# Patient Record
Sex: Female | Born: 1945
Health system: Southern US, Community
[De-identification: ages and names within clinical notes are randomized; demographics above are authoritative.]

## PROBLEM LIST (undated history)

## (undated) DIAGNOSIS — J449 Chronic obstructive pulmonary disease, unspecified: Secondary | ICD-10-CM

## (undated) DIAGNOSIS — H548 Legal blindness, as defined in USA: Secondary | ICD-10-CM

## (undated) DIAGNOSIS — E119 Type 2 diabetes mellitus without complications: Secondary | ICD-10-CM

## (undated) DIAGNOSIS — I517 Cardiomegaly: Secondary | ICD-10-CM

## (undated) DIAGNOSIS — I10 Essential (primary) hypertension: Secondary | ICD-10-CM

## (undated) DIAGNOSIS — Z87442 Personal history of urinary calculi: Secondary | ICD-10-CM

## (undated) DIAGNOSIS — I639 Cerebral infarction, unspecified: Secondary | ICD-10-CM

## (undated) DIAGNOSIS — K219 Gastro-esophageal reflux disease without esophagitis: Secondary | ICD-10-CM

## (undated) DIAGNOSIS — D649 Anemia, unspecified: Secondary | ICD-10-CM

## (undated) DIAGNOSIS — E7800 Pure hypercholesterolemia, unspecified: Secondary | ICD-10-CM

## (undated) DIAGNOSIS — I509 Heart failure, unspecified: Secondary | ICD-10-CM

## (undated) DIAGNOSIS — I4891 Unspecified atrial fibrillation: Secondary | ICD-10-CM

## (undated) DIAGNOSIS — G4700 Insomnia, unspecified: Secondary | ICD-10-CM

## (undated) DIAGNOSIS — I82409 Acute embolism and thrombosis of unspecified deep veins of unspecified lower extremity: Secondary | ICD-10-CM

## (undated) DIAGNOSIS — I96 Gangrene, not elsewhere classified: Secondary | ICD-10-CM

## (undated) HISTORY — PX: GLAUCOMA SURGERY: SHX656

## (undated) HISTORY — PX: CATARACT EXTRACTION, BILATERAL: SHX1313

## (undated) HISTORY — DX: Chronic obstructive pulmonary disease, unspecified: J44.9

## (undated) HISTORY — DX: Cerebral infarction, unspecified: I63.9

## (undated) HISTORY — DX: Anemia, unspecified: D64.9

## (undated) HISTORY — DX: Gastro-esophageal reflux disease without esophagitis: K21.9

## (undated) HISTORY — DX: Pure hypercholesterolemia, unspecified: E78.00

## (undated) HISTORY — DX: Acute embolism and thrombosis of unspecified deep veins of unspecified lower extremity: I82.409

## (undated) HISTORY — DX: Heart failure, unspecified: I50.9

## (undated) HISTORY — DX: Legal blindness, as defined in USA: H54.8

## (undated) HISTORY — DX: Type 2 diabetes mellitus without complications: E11.9

## (undated) HISTORY — PX: TUBAL LIGATION: SHX77

## (undated) HISTORY — DX: Insomnia, unspecified: G47.00

## (undated) HISTORY — PX: OTHER SURGICAL HISTORY: SHX169

## (undated) HISTORY — DX: Essential (primary) hypertension: I10

## (undated) HISTORY — DX: Cardiomegaly: I51.7

## (undated) HISTORY — PX: EYE SURGERY: SHX253

## (undated) HISTORY — DX: Gangrene, not elsewhere classified: I96

## (undated) HISTORY — DX: Unspecified atrial fibrillation: I48.91

---

## 2001-11-12 ENCOUNTER — Inpatient Hospital Stay (HOSPITAL_COMMUNITY): Admission: EM | Admit: 2001-11-12 | Discharge: 2001-11-18 | Payer: Self-pay | Admitting: Internal Medicine

## 2003-02-27 HISTORY — PX: FRACTURE SURGERY: SHX138

## 2008-02-27 HISTORY — PX: TIBIA FRACTURE SURGERY: SHX806

## 2009-12-14 ENCOUNTER — Emergency Department (HOSPITAL_COMMUNITY): Admission: EM | Admit: 2009-12-14 | Discharge: 2009-12-14 | Payer: Self-pay | Admitting: Emergency Medicine

## 2010-05-10 LAB — GLUCOSE, CAPILLARY: Glucose-Capillary: 118 mg/dL — ABNORMAL HIGH (ref 70–99)

## 2010-05-15 ENCOUNTER — Emergency Department (HOSPITAL_COMMUNITY)
Admission: EM | Admit: 2010-05-15 | Discharge: 2010-05-15 | Disposition: A | Discharge status: Home / Self Care | Payer: Self-pay | Attending: Emergency Medicine | Admitting: Emergency Medicine

## 2010-05-15 ENCOUNTER — Emergency Department (HOSPITAL_COMMUNITY): Payer: Self-pay

## 2010-05-15 DIAGNOSIS — H5440 Blindness, one eye, unspecified eye: Secondary | ICD-10-CM | POA: Insufficient documentation

## 2010-05-15 DIAGNOSIS — M5030 Other cervical disc degeneration, unspecified cervical region: Secondary | ICD-10-CM | POA: Insufficient documentation

## 2010-05-15 DIAGNOSIS — I10 Essential (primary) hypertension: Secondary | ICD-10-CM | POA: Insufficient documentation

## 2010-05-15 DIAGNOSIS — M542 Cervicalgia: Secondary | ICD-10-CM | POA: Insufficient documentation

## 2010-05-15 DIAGNOSIS — Z7901 Long term (current) use of anticoagulants: Secondary | ICD-10-CM | POA: Insufficient documentation

## 2010-05-15 DIAGNOSIS — I4891 Unspecified atrial fibrillation: Secondary | ICD-10-CM | POA: Insufficient documentation

## 2010-05-15 DIAGNOSIS — E119 Type 2 diabetes mellitus without complications: Secondary | ICD-10-CM | POA: Insufficient documentation

## 2010-07-14 NOTE — Discharge Summary (Signed)
NAME:  Rasmusson, ELLEAH DELLING                           ACCOUNT NO.:  [id number]   MEDICAL RECORD NO.:  [id number]                   PATIENT TYPE:  INP   LOCATION:  [id number]                                 FACILITY:  [facility]   PHYSICIAN:  Delrae Rend, M.D.                  DATE OF BIRTH:  [birth year]   DATE OF ADMISSION:  11/12/2001  DATE OF DISCHARGE:  11/18/2001                                 DISCHARGE SUMMARY   DISCHARGE DIAGNOSES:  1. Left lower lobe pneumonia.  2. Chronic obstructive pulmonary disease.  3. Diabetes mellitus.  4. Chronic atrial fibrillation.  5. Glaucoma.   DISCHARGE MEDICATIONS:  1. Coumadin 5 mg Monday, Wednesday, Friday, and Saturday and 10 mg Tuesday,     Thursday, and Sunday.  2. Cardizem 240 mg p.o. q.d.  3. Diovan/HCT 160/12.5 one q.d.  4. Combivent MDI 1-2 puffs q.i.d.  5. Glucophage 500 mg 1 tablet p.o. b.i.d.  6. Augmentin 875 mg 1 tablet b.i.d. for seven days.  7. Timolol 0.5% one drop each eye q.d.  8. Azopt 1% one drop each eye b.i.d.  9. Travatan 0.004% one drop each eye q.h.s.  10.      Prednisone taper 50 mg q.d. x2 days, 40 mg q.d. x2 days, 30 mg q.d.     x2 days, 20 mg q.d. x2 days, then 10 mg q.d. x2 days.   DISPOSITION AND FOLLOW UP:  The patient was discharged feeling much better  and in good condition.  She is to follow up with Dr. Manuella Ghazi at [facility]  on November 21, 2001 at 11 a.m.  We asked Dr. Manuella Ghazi to please evaluate her  INR, as she was not fully therapeutic when she left the hospital.  She left  the hospital with an INR of 1.6.  Also, we asked him to please check her  glucose blood sugars, as she was requiring insulin during her hospital stay.   PROCEDURES:  None.   CONSULTATIONS:  None.   HISTORY OF PRESENT ILLNESS:  The patient is a 65 year old African American  female who began to have chills eight days prior to admission.  The  following day, after the onset of chills, she developed shortness of breath  and wheezing and  orthopnea.  She received antibiotics from her physician  without any improvement.  Her shortness of breath increased and she went to  the [facility] on Sunday, which was three days prior to her admission.  At the time, she endorsed a slight productive cough and fever, positive  orthopnea.  No nausea, vomiting, or diarrhea.  No chest pain, no numbness,  no dizziness.  She was admitted to [facility], where a chest x-ray and CT  were performed showing bilateral left lower lobe infiltrate and possibly a  left upper lung infiltrate with no signs of pulmonary embolism.  She  received low-dose steroids 30 mg  and Levaquin.  Also, Rocephin at [facility] with little improvement of her shortness of breath.  She was still  febrile at that hospital.  The patient was transferred to [facility]  secondary to family wishes and we received her on November 12, 2001.  At  the time of admission, she came with a sputum culture from the outside  hospital showing moderate gram-positive cocci, moderate white blood cells,  few gram-positive diplococci, and rare gram-negative.  She had had a blood  culture that was negative x48 hours.  She had an INR of 2.3, a PT of 19.1, a  white cell count of 6.0, an H&H of 12.9 and 37.6, platelets of 181, and a  BNP of 46.7.  She had a basic metabolic panel showing sodium 137, potassium  3.7, chloride 102, CO2 27.0, BUN 7, creatinine 0.7, and glucose was 187.  Calcium 8.6.  IgG at 873.  Total protein was 7.7, albumin 3.3, and alkaline  phosphatase 48.  On physical exam, she was found to have bilateral basal  rhonchi and rales, right greater than left, with a few scattered expiratory  wheezes.  She had positive egophony in the right lung diffusely and she had  an irregular heart rate but no murmurs, rubs, or gallops were appreciated.  She had some edema in her right lower leg at a site of a prior surgery and  no edema on her left lower leg.  Everything else in her  physical exam was  benign.   HOSPITAL COURSE:  1. PNEUMONIA:  The patient was admitted with known bilateral lower lobe     pneumonia.  She had received Levaquin and Rocephin x4 days at the outside     hospital.  On arrival at [facility], she was started on azithromycin and     Zosyn.  She remained afebrile throughout her hospital stay and did     require a significant amount, 3-4 L, of oxygen in order to keep her     saturations up above 90%.  She had a chest x-ray performed here showing     left lower lobe infiltrates bilaterally and she was found to have a white     cell count of 13.9.  Her blood cultures were negative, sputum cultures     were negative, and she remained afebrile throughout the hospital course.     On day #4 of hospital admission, the patient was switched from IV     azithromycin and Zosyn to oral Augmentin.  She continued to be afebrile     and continued to improve.  At the time of discharge, she still had mild     crackles in both bases bilaterally; however, these were greatly improved     over admission.  She was sent home with a further seven-day course of     Augmentin.   1. CHRONIC OBSTRUCTIVE PULMONARY DISEASE:  At the time of admission, the     patient had a blood gas performed which showed values as follows:  7.45,     38.1, 53.4 on 2 L of oxygen.  The patient was started on Atrovent and     albuterol nebulizers q.3h. and q.6h. and her shortness of breath improved     quickly with this regimen.  She was also placed on around-the-clock     oxygen and towards the latter part of her hospital stay we performed an     oxygen challenge, having the patient walk without  oxygen while monitoring     of her O2 saturations and monitoring saturations while at rest.  At rest     without oxygen, the patient was saturating in the upper 80s and while     walking was saturating in the lower 80s.  It was determined that the    patient would need to go home with oxygen, given  her COPD, and thought     that possibly as the pneumonia continues to resolve she may be able to     get off the oxygen; however, given the extent of her disease, this may be     unlikely.  The patient was also started on Solu-Medrol 125 mg q.8h. at     admission and her steroids were continued for several days and then     tapered to p.o.  She was sent home on a prednisone taper and was given     inhaled steroids.  We recommend that her primary care physician get PFT's     as an outpatient to further evaluate the extent of her lung disease.   1. DIABETES MELLITUS:  The patient had an increased blood sugar on     admission.  This was thought to be secondary to her diabetes and also the     steroids that we were giving her.  She was placed on a sliding scale     insulin and also a baseline NPH.  She was requiring 20 units of NPH     b.i.d. in order to maintain glucose control in the low 200s.  She     appeared to be relatively insulin resistant.  At the time of discharge,     her sugars were still running in the high 100s/low 200s and she was sent     home on an increased dosage of her Glucophage.  It is possible that she     may require the use of insulin in the future; however, we are hopeful     that as she tapers off her steroids her blood sugars will improve.   1. CHRONIC ATRIAL FIBRILLATION:  The patient has known chronic atrial     fibrillation and was maintained on Coumadin throughout the hospital stay.     She was brought to [place] from the outside hospital on a dose of 2.5 mg of     Coumadin with an INR of 2.3.  Her INR trended downward throughout her     hospital stay and she was discharged on her original outpatient regimen     of Coumadin of alternating days 5 mg with 10 mg.  She was monitored on     telemetry throughout her stay with only a few episodes of pauses and     bradycardia with no signs or symptoms during these.  She was otherwise     very well rate controlled on  Cardizem.   1. GLAUCOMA:  The patient was continued on her home regimen of eyedrops for     her glaucoma.   1. SMOKING:  We counseled the patient at great length and encouraged her to     stop smoking.  We explained to her all the benefits involved.   DISCHARGE LABORATORY DATA:  INR 1.6, PT 18.4.  White blood cell 10.4, H&H  12.5 and 36.4,  _____ 38.9.  Sodium 138, potassium 3.9, chloride 93, CO2 26,  BUN 20, creatinine 0.8, _______ 268.     Manus Rudd, MS-IV  Delrae Rend, M.D.    SJ/MEDQ  D:  11/18/2001  T:  11/18/2001  Job:  [id number]   cc:   Monico Blitz  [place]  [place] [id number]  Fax: [phone]

## 2010-08-23 ENCOUNTER — Encounter (HOSPITAL_COMMUNITY)
Admission: RE | Admit: 2010-08-23 | Discharge: 2010-08-23 | Disposition: A | Discharge status: Home / Self Care | Source: Ambulatory Visit | Attending: Ophthalmology | Admitting: Ophthalmology

## 2010-08-23 LAB — BASIC METABOLIC PANEL
Chloride: 102 mEq/L (ref 96–112)
GFR calc Af Amer: >60 mL/min (ref >60)
GFR calc non Af Amer: 56 mL/min — ABNORMAL LOW (ref >60)
Glucose, Bld: 214 mg/dL — ABNORMAL HIGH (ref 70–99)
Potassium: 4.5 mEq/L (ref 3.5–5.1)
Sodium: 138 mEq/L (ref 135–145)

## 2010-08-23 LAB — HEMOGLOBIN AND HEMATOCRIT, BLOOD: HCT: 38.1 % (ref 36.0–46.0)

## 2010-08-31 ENCOUNTER — Ambulatory Visit (HOSPITAL_COMMUNITY)
Admission: RE | Admit: 2010-08-31 | Discharge: 2010-08-31 | Disposition: A | Discharge status: Home / Self Care | Source: Ambulatory Visit | Attending: Ophthalmology | Admitting: Ophthalmology

## 2010-08-31 DIAGNOSIS — J4489 Other specified chronic obstructive pulmonary disease: Secondary | ICD-10-CM | POA: Insufficient documentation

## 2010-08-31 DIAGNOSIS — J449 Chronic obstructive pulmonary disease, unspecified: Secondary | ICD-10-CM | POA: Insufficient documentation

## 2010-08-31 DIAGNOSIS — Z79899 Other long term (current) drug therapy: Secondary | ICD-10-CM | POA: Insufficient documentation

## 2010-08-31 DIAGNOSIS — E119 Type 2 diabetes mellitus without complications: Secondary | ICD-10-CM | POA: Insufficient documentation

## 2010-08-31 DIAGNOSIS — I10 Essential (primary) hypertension: Secondary | ICD-10-CM | POA: Insufficient documentation

## 2010-08-31 DIAGNOSIS — H2589 Other age-related cataract: Secondary | ICD-10-CM | POA: Insufficient documentation

## 2010-08-31 DIAGNOSIS — Z7901 Long term (current) use of anticoagulants: Secondary | ICD-10-CM | POA: Insufficient documentation

## 2010-08-31 DIAGNOSIS — H4011X0 Primary open-angle glaucoma, stage unspecified: Secondary | ICD-10-CM | POA: Insufficient documentation

## 2010-08-31 LAB — GLUCOSE, CAPILLARY: Glucose-Capillary: 155 mg/dL — ABNORMAL HIGH (ref 70–99)

## 2010-09-04 NOTE — Op Note (Signed)
  NAME:  [name], Bethany                 ACCOUNT NO.:  [id number]  MEDICAL RECORD NO.:  [id number]  LOCATION:  [facility]                          FACILITY:  [facility]  PHYSICIAN:  Richardo Hanks, MD       DATE OF BIRTH:  [birth year]  DATE OF PROCEDURE:  08/31/2010 DATE OF DISCHARGE:                              OPERATIVE REPORT   PREOPERATIVE DIAGNOSES:  Combined cataract, right eye, diagnosis code 366.19, additional diagnostic code is primary open angle glaucoma, diagnosis code 365.11.  POSTOPERATIVE DIAGNOSES:  Combined cataract, right eye, diagnosis code 366.19, additional diagnostic code is primary open angle glaucoma, diagnosis code 365.11.  PROCEDURE PERFORMED:  Phacoemulsification with intraocular lens implantation, right eye.  SURGEON:  Richardo Hanks, MD  DESCRIPTION OF OPERATION:  In the preoperative holding area, dilating drops and viscous lidocaine were placed into the left eye.  The patient was then brought to the operating room where she was prepped and draped. Beginning with a 75-blade, a paracentesis port was made at the surgeon's 2 o'clock position.  The anterior chamber was then filled with a 1% nonpreserved lidocaine solution.  This was followed by filling the anterior chamber with Provisc.  The 2.4-mm keratome blade was then used to make a clear corneal incision at the temporal limbus.  A bent cystotome needle and Utrata forceps were used to create a continuous tear capsulotomy.  Hydrodissection was performed with balanced salt solution on a fine cannula.  The lens nucleus was then removed using phacoemulsification in a quadrant cracking technique.  Residual cortex was removed with irrigation and aspiration.  The capsular bag and anterior chamber were then refilled with Provisc and a posterior chamber intraocular lens was placed into the capsular bag without difficulty using a lens injecting system.  The Provisc was removed from the capsular bag and anterior chamber with  irrigation and aspiration. Stromal hydration of the main incision and paracentesis ports was performed with balanced salt solution on a fine cannula.  The wounds were tested for leak, which were negative.  The patient tolerated the procedure well.  There were no operative complications and she was returned to the recovery area in satisfactory condition.  No surgical specimens.  PROSTHETIC DEVICE USED:  A Lenstec posterior chamber lens, model Softec HD, power of 22.75, serial number is [id number].          ______________________________ Richardo Hanks, MD     KEH/MEDQ  D:  08/31/2010  T:  09/01/2010  Job:  [id number]  Electronically Signed by Tonny Branch MD on 09/04/2010 12:45:15 PM

## 2012-02-27 HISTORY — PX: GLAUCOMA SURGERY: SHX656

## 2012-10-03 DIAGNOSIS — H26499 Other secondary cataract, unspecified eye: Secondary | ICD-10-CM | POA: Insufficient documentation

## 2012-10-07 DIAGNOSIS — I10 Essential (primary) hypertension: Secondary | ICD-10-CM | POA: Insufficient documentation

## 2012-10-07 DIAGNOSIS — J449 Chronic obstructive pulmonary disease, unspecified: Secondary | ICD-10-CM | POA: Insufficient documentation

## 2012-10-22 DIAGNOSIS — Z01810 Encounter for preprocedural cardiovascular examination: Secondary | ICD-10-CM

## 2013-01-08 DIAGNOSIS — Z87442 Personal history of urinary calculi: Secondary | ICD-10-CM | POA: Insufficient documentation

## 2013-01-08 DIAGNOSIS — K219 Gastro-esophageal reflux disease without esophagitis: Secondary | ICD-10-CM | POA: Insufficient documentation

## 2013-03-25 DIAGNOSIS — I517 Cardiomegaly: Secondary | ICD-10-CM | POA: Insufficient documentation

## 2013-03-25 DIAGNOSIS — Z7901 Long term (current) use of anticoagulants: Secondary | ICD-10-CM | POA: Insufficient documentation

## 2013-03-25 DIAGNOSIS — F17201 Nicotine dependence, unspecified, in remission: Secondary | ICD-10-CM | POA: Insufficient documentation

## 2013-03-25 DIAGNOSIS — I358 Other nonrheumatic aortic valve disorders: Secondary | ICD-10-CM | POA: Insufficient documentation

## 2013-03-25 DIAGNOSIS — R0602 Shortness of breath: Secondary | ICD-10-CM | POA: Insufficient documentation

## 2013-10-09 ENCOUNTER — Other Ambulatory Visit: Payer: Self-pay

## 2013-10-09 ENCOUNTER — Encounter: Payer: Self-pay | Admitting: Vascular Surgery

## 2013-10-09 DIAGNOSIS — I739 Peripheral vascular disease, unspecified: Secondary | ICD-10-CM

## 2013-10-20 ENCOUNTER — Encounter: Payer: Self-pay | Admitting: Vascular Surgery

## 2013-10-21 ENCOUNTER — Encounter: Payer: Self-pay | Admitting: Vascular Surgery

## 2013-10-21 ENCOUNTER — Ambulatory Visit (INDEPENDENT_AMBULATORY_CARE_PROVIDER_SITE_OTHER): Payer: PRIVATE HEALTH INSURANCE | Admitting: Vascular Surgery

## 2013-10-21 ENCOUNTER — Ambulatory Visit (HOSPITAL_COMMUNITY)
Admission: RE | Admit: 2013-10-21 | Discharge: 2013-10-21 | Disposition: A | Discharge status: Home / Self Care | Payer: PRIVATE HEALTH INSURANCE | Source: Ambulatory Visit | Attending: Vascular Surgery | Admitting: Vascular Surgery

## 2013-10-21 VITALS — BP 183/76 | HR 57 | Resp 18 | Ht 68.0 in | Wt 240.2 lb

## 2013-10-21 DIAGNOSIS — I739 Peripheral vascular disease, unspecified: Secondary | ICD-10-CM

## 2013-10-21 DIAGNOSIS — I10 Essential (primary) hypertension: Secondary | ICD-10-CM | POA: Insufficient documentation

## 2013-10-21 DIAGNOSIS — E119 Type 2 diabetes mellitus without complications: Secondary | ICD-10-CM | POA: Diagnosis not present

## 2013-10-21 NOTE — Addendum Note (Signed)
Addended by: Mena Goes on: 10/21/2013 03:11 PM   Modules accepted: Orders

## 2013-10-21 NOTE — Progress Notes (Signed)
Patient ID: ADUT AEGERTER, female   DOB: 12-Nov-1945, 68 y.o.   MRN: [id number] Pt. approached the check-in desk in the front lobby.  The Receptionist witnessed that the pt. reached to sit down in a chair, and started to sit on the arm of the chair, instead of the chair seat, and landed on the floor, on her buttocks.  Nurse was alerted immediately, and upon arrival at the scene, the pt. had already been assisted to the chair, by 2 other people in the front lobby.  Pt. alert and responded to verbal stimuli, immediately.  Denied any pain or injury from her fall.  Explained that she missed the chair seat and fell on her buttocks. The patient's daughter arrived after the patient had been assisted to sit in the chair.  The daughter stated "she doesn't see very well", and explained that the pt. is legally blind. Check-in process completed and pt. ambulated with assistance to an exam room, for further evaluation.  VS @ 11:33 AM:   BP. 200/72, P. 52, R. 24. Pt. denied any discomfort upon questioning.  Able to actively move upper and lower extremities without difficulty or complaints.  Nurse visually assessed pt's buttocks; no noted bruising or break in skin.  Daughter, Everlene Other remained with pt.  Stated pt. has BP medication she should take if her BP is > 123XX123 systolically.  Rechecked BP @ 11:36 AM: 196/85.  Pt. ambulated with assistance to the Sub-waiting area for the Vascular Lab.  Pt. took Clonidine 0.1 mg po, for elevated BP.  Dr. Oneida Alar notified of pt's fall.  Pt. will continue with appointment for Vascular study and to see Dr. Oneida Alar.

## 2013-10-21 NOTE — Progress Notes (Signed)
[facility] [place] HISTORY AND PHYSICAL   History of Present Illness:  Patient is a 68 y.o. year old female who presents for evaluation of peripheral arterial disease.  She was noted to have an abnormal non invasive arterial study on screening July 2015.  She is asymptomatic. She denies claudication rest pain or non healing wounds. Other medical problems include hypertension, diabetes, elevated cholesterol, COPD, afib.  The patient is on warfarin.  Past Medical History  Diagnosis Date  . COPD (chronic obstructive pulmonary disease)   . Hypertension   . Diabetes mellitus without complication   . Atrial fibrillation   . Anemia   . Insomnia   . Legally blind in left eye, as defined in [place]     Past Surgical History  Procedure Laterality Date  . Bilareral tubal ligation    . Removal of sweat glands      per pt. 30 years ago  . Glaucoma surgery Right   . Tibia fracture surgery Right 2010    Social History History  Substance Use Topics  . Smoking status: Former Smoker    Quit date: 02/26/2001  . Smokeless tobacco: Not on file  . Alcohol Use: No    Family History Family History  Problem Relation Age of Onset  . Heart disease Mother   . Hypertension Mother   . Hypertension Father     Allergies  Allergies  Allergen Reactions  . Sulfa Antibiotics     Swelling, hives  . Tape     Skin irritation and breakdown     Current Outpatient Prescriptions  Medication Sig Dispense Refill  . albuterol (PROVENTIL HFA;VENTOLIN HFA) 108 (90 BASE) MCG/ACT inhaler Inhale 2 puffs into the lungs every 6 (six) hours as needed for wheezing or shortness of breath.      Marland Kitchen albuterol (PROVENTIL HFA;VENTOLIN HFA) 108 (90 BASE) MCG/ACT inhaler Inhale 1 puff into the lungs every 6 (six) hours as needed for wheezing or shortness of breath.      Marland Kitchen azelastine (ASTELIN) 0.1 % nasal spray Place 1 spray into both nostrils 2 (two) times daily. Use in each nostril as directed      .  budesonide-formoterol (SYMBICORT) 160-4.5 MCG/ACT inhaler Inhale 2 puffs into the lungs 2 (two) times daily.      . cloNIDine (CATAPRES) 0.2 MG tablet Take 0.1 mg by mouth 2 (two) times daily. As needed for systolic BP > 123XX123      . diltiazem (CARDIZEM CD) 240 MG 24 hr capsule Take 240 mg by mouth daily.      Marland Kitchen esomeprazole (NEXIUM) 40 MG capsule Take 40 mg by mouth daily at 12 noon.      . furosemide (LASIX) 40 MG tablet Take 40 mg by mouth daily.       . hydrALAZINE (APRESOLINE) 25 MG tablet Take 50 mg by mouth 2 (two) times daily.       . Iron, Ferrous Gluconate, 256 (28 FE) MG TABS Take 1 tablet by mouth daily.      Marland Kitchen labetalol (NORMODYNE) 200 MG tablet Take 200 mg by mouth 2 (two) times daily.      . Magnesium 250 MG TABS Take 250 mg by mouth daily.      . metFORMIN (GLUCOPHAGE) 500 MG tablet Take 500 mg by mouth 2 (two) times daily with a meal.      . potassium chloride (K-DUR) 10 MEQ tablet Take 20 mEq by mouth daily. Take with lasix      .  tamsulosin (FLOMAX) 0.4 MG CAPS capsule Take 0.4 mg by mouth daily after breakfast.      . traMADol (ULTRAM) 50 MG tablet Take 50 mg by mouth every 6 (six) hours as needed.      . valsartan (DIOVAN) 160 MG tablet Take 160 mg by mouth 2 (two) times daily.       Marland Kitchen warfarin (COUMADIN) 5 MG tablet Take 5 mg by mouth daily. On Tuesdays and Thursdays takes 1 1/2 (5 mg tablets).  On Monday, Wednesday, Thursday, Saturday and Sunday take 2 (5 mg) tablets.       No current facility-administered medications for this visit.    ROS:   General:  No weight loss, Fever, chills  HEENT: No recent headaches, no nasal bleeding, no visual changes, no sore throat  Neurologic: No dizziness, blackouts, seizures. No recent symptoms of stroke or mini- stroke. No recent episodes of slurred speech, or temporary blindness.  Cardiac: No recent episodes of chest pain/pressure, no shortness of breath at rest.  + shortness of breath with exertion.  + history of atrial  fibrillation or irregular heartbeat  Vascular: No history of rest pain in feet.  No history of claudication.  No history of non-healing ulcer, No history of DVT   Pulmonary: No home oxygen, no productive cough, no hemoptysis,  +asthma or wheezing  Musculoskeletal:  [x ] Arthritis, [ ]  Low back pain,  [x ] Joint pain  Multiple prior right leg fractures tib fib area requiring ORIF  Hematologic:No history of hypercoagulable state.  No history of easy bleeding.  No history of anemia  Gastrointestinal: No hematochezia or melena,  No gastroesophageal reflux, no trouble swallowing  Urinary: [ ]  chronic Kidney disease, [ ]  on HD - [ ]  MWF or [ ]  TTHS, [ ]  Burning with urination, [ ]  Frequent urination, [ ]  Difficulty urinating;   Skin: No rashes  Psychological: No history of anxiety,  No history of depression   Physical Examination  Filed Vitals:   10/21/13 1225  BP: 183/76  Pulse: 57  Resp: 18  Height: 5\' 8"  (1.727 m)  Weight: 240 lb 3.2 oz (108.954 kg)    Body mass index is 36.53 kg/(m^2).  General:  Alert and oriented, no acute distress HEENT: Normal Neck: No bruit or JVD Pulmonary: Clear to auscultation bilaterally Cardiac: Regular Rate and Rhythm without murmur Abdomen: Soft, non-tender, non-distended, no mass, obese Skin: No rash, right leg edema 20% larger than left, multiple scars from prior ORIF Extremity Pulses:  2+ radial, brachial, femoral,absent dorsalis pedis, posterior tibial pulses bilaterally Musculoskeletal: No deformity or edema  Neurologic: Upper and lower extremity motor 5/5 and symmetric  DATA:  ABI reviewed from [facility] 7/15 calcified vessels, possible right SFA stenosis.  ABIs in our office today which I reviewed and interpreted.  Right 0.95, left 1.04.  Toe pressure 145 right 160 left.   ASSESSMENT:  Evidence of some PAD R>L but basically asymptomatic and adequate perfusion to prevent limb loss.   PLAN:  Management of medical risk factors discussed  with pt.  She will have follow up ABIs in 6 months.  jWalking program of 30 min daily to improve perfusion and lose weight.  Compression stocking right leg if swelling bothers her.  Ruta Hinds, MD [facility] [place] Office: [phone] Pager: [phone]

## 2013-12-08 ENCOUNTER — Ambulatory Visit (INDEPENDENT_AMBULATORY_CARE_PROVIDER_SITE_OTHER): Payer: PRIVATE HEALTH INSURANCE | Admitting: Neurology

## 2013-12-08 ENCOUNTER — Encounter: Payer: Self-pay | Admitting: Neurology

## 2013-12-08 VITALS — BP 179/80 | HR 93 | Temp 98.9°F | Resp 14 | Ht 68.0 in | Wt 230.0 lb

## 2013-12-08 DIAGNOSIS — I481 Persistent atrial fibrillation: Secondary | ICD-10-CM

## 2013-12-08 DIAGNOSIS — I4819 Other persistent atrial fibrillation: Secondary | ICD-10-CM

## 2013-12-08 DIAGNOSIS — R351 Nocturia: Secondary | ICD-10-CM

## 2013-12-08 DIAGNOSIS — I63412 Cerebral infarction due to embolism of left middle cerebral artery: Secondary | ICD-10-CM

## 2013-12-08 DIAGNOSIS — R0683 Snoring: Secondary | ICD-10-CM

## 2013-12-08 DIAGNOSIS — E669 Obesity, unspecified: Secondary | ICD-10-CM

## 2013-12-08 NOTE — Progress Notes (Signed)
Subjective:    Patient ID: Bethany [name] is a 68 y.o. female.  HPI    Star Age, MD, PhD [facility] [address] [place], [place] [address]  Dear Dr. Woody Seller,   I saw your patient, Bethany [name], upon your kind request in my neurologic clinic today for initial consultation of her altered mental status in the context of recent embolic strokes. The patient is accompanied by her daughter today. As you know, Bethany [name] is a 69 year old right-handed woman with an underlying medical history of COPD, A. fib on chronic warfarin therapy, diabetes, anemia, insomnia, legal blindness, mild diastolic heart failure, kidney stones, hypertension, status post tibia fracture in 2010, status post glaucoma surgery in the right, status post bilateral tubal ligation, who presented to [facility] on 12/01/2013 with new onset altered mental status which per daughter was quite acute. She was fine the day prior and had stopped her coumadin, per instructions in preparation for an elective hernia surgery that day. She lives alone. She had no dysarthria or focal weakness at the time. I reviewed hospital records from her had [facility] in [place], [place]. Blood work included a CMP and TSH, CBC with differential, all of which were unremarkable with the exception of a glucose level of 175, urinalysis was negative. Of note, INR was subtherapeutic at 1.1. An echocardiogram showed an EF of 65%, LVH, otherwise unremarkable per your progress report on 12/02/2013. She was noted to have expressive aphasia she was diagnosed with left MCA stroke, embolic. An EKG showed A. fib. Carotid Doppler studies on 12/02/2013 showed mild carotid bulb and proximal ICA plaque, right greater than left, resulting in less than 50% diameter stenosis. MRI brain without contrast on 12/02/2013 showed patchy acute left MCA infarcts, confluent at the posterior insula and left superior temporal  gyrus, no mass effect or hemorrhage, mild underlying chronic small vessel ischemia. X-ray left toes on 12/01/2013 showed showed slightly displaced and angulated fracture of the distal metaphysis of the proximal phalanx of the left fourth digit. CT head without contrast on 12/01/2013 showed no acute abnormality. Portable x-ray chest on 12/01/2013 showed mild cardiomegaly.  She was bridged with Lovenox, which she is still getting. Her latest INR was 1.8, she is on 10 mg of coumadin. Her daughter has been staying with her. She was also started on a baby ASA in the hospital. She does not drive. She does not drink alcohol and per daughter was a heavy drinker in the past and quit in 96. She stopped smoking in 2003. She had 3 children, only one alive.  She was discharged to home with home health occupational, physical, speech therapy and nursing.  She snores. She has some tiredness during the day. She has nocturia 4-5 times per night. She does take a diuretic twice daily, with the later dose at 4 PM. She has done well since her hospital discharge. She is able to dress herself. She is supposed to get her INR checked today. At baseline, she was taking Coumadin 7.5 mg on 2 days of the week and 10 mg the other days. She had her INR checked typically once a month. In the past she was able to successfully stop the Coumadin for her eye surgery without any issues at the time. She denies any headaches. She denies any weakness at this time. Her thinking is much better. Her daughter, she is almost back to baseline.   Her Past Medical History Is Significant  For: Past Medical History  Diagnosis Date  . COPD (chronic obstructive pulmonary disease)   . Hypertension   . Diabetes mellitus without complication   . Atrial fibrillation   . Anemia   . Insomnia   . Legally blind in left eye, as defined in [place]   . Hypercholesteremia   . Enlarged heart     Her Past Surgical History Is Significant For: Past Surgical History   Procedure Laterality Date  . Bilareral tubal ligation    . Removal of sweat glands      per pt. 30 years ago  . Glaucoma surgery Right   . Tibia fracture surgery Right 2010    Her Family History Is Significant For: Family History  Problem Relation Age of Onset  . Heart disease Mother   . Hypertension Mother   . Hypertension Father     Her Social History Is Significant For: History   Social History  . Marital Status: Divorced    Spouse Name: N/A    Number of Children: N/A  . Years of Education: N/A   Social History Main Topics  . Smoking status: Former Smoker    Quit date: 02/26/2001  . Smokeless tobacco: None  . Alcohol Use: No  . Drug Use: No  . Sexual Activity: None   Other Topics Concern  . None   Social History Narrative   Caffeine 1 cup daily.  Divorced. Home alone.  3 kids, (2 deceased).  11th grade.    Her Allergies Are:  Allergies  Allergen Reactions  . Sulfa Antibiotics     Swelling, hives  . Tape     Skin irritation and breakdown  :   Her Current Medications Are:  Outpatient Encounter Prescriptions as of 12/08/2013  Medication Sig  . albuterol (PROVENTIL HFA;VENTOLIN HFA) 108 (90 BASE) MCG/ACT inhaler Inhale 2 puffs into the lungs every 6 (six) hours as needed for wheezing or shortness of breath.  Marland Kitchen albuterol (PROVENTIL HFA;VENTOLIN HFA) 108 (90 BASE) MCG/ACT inhaler Inhale 1 puff into the lungs every 6 (six) hours as needed for wheezing or shortness of breath.  Marland Kitchen aspirin (ASPIRIN EC) 81 MG EC tablet Take 81 mg by mouth. Swallow whole.  Marland Kitchen azelastine (ASTELIN) 0.1 % nasal spray Place 1 spray into both nostrils 2 (two) times daily. Use in each nostril as directed  . Blood Glucose Monitoring Suppl (FORA V30A BLOOD GLUCOSE SYSTEM) DEVI by Does not apply route.  . budesonide-formoterol (SYMBICORT) 160-4.5 MCG/ACT inhaler Inhale 2 puffs into the lungs 2 (two) times daily.  . cloNIDine (CATAPRES) 0.2 MG tablet Take 0.1 mg by mouth 2 (two) times daily. As  needed for systolic BP > 123XX123  . diltiazem (CARDIZEM CD) 240 MG 24 hr capsule Take 240 mg by mouth daily.  . Dorzolamide HCl-Timolol Mal PF 22.3-6.8 MG/ML SOLN Apply to eye.  . enoxaparin (LOVENOX) 100 MG/ML injection Inject 100 mg into the skin every 12 (twelve) hours.  Marland Kitchen esomeprazole (NEXIUM) 40 MG capsule Take 40 mg by mouth daily at 12 noon.  . furosemide (LASIX) 40 MG tablet Take 40 mg by mouth. 1 tab am, 1/2 tab in pm  . hydrALAZINE (APRESOLINE) 25 MG tablet Take 50 mg by mouth 2 (two) times daily.   . Iron, Ferrous Gluconate, 256 (28 FE) MG TABS Take 1 tablet by mouth daily.  Marland Kitchen labetalol (NORMODYNE) 200 MG tablet Take 200 mg by mouth 2 (two) times daily.  . Magnesium 250 MG TABS Take 250 mg by mouth  daily.  . metFORMIN (GLUCOPHAGE) 500 MG tablet Take 500 mg by mouth. 2 tabs am and 1 tab pm  . potassium chloride (K-DUR) 10 MEQ tablet Take 20 mEq by mouth daily. Take with lasix  . tamsulosin (FLOMAX) 0.4 MG CAPS capsule Take 0.4 mg by mouth daily after breakfast.  . traMADol (ULTRAM) 50 MG tablet Take 50 mg by mouth every 6 (six) hours as needed.  . valsartan (DIOVAN) 160 MG tablet Take 160 mg by mouth 2 (two) times daily.   Marland Kitchen warfarin (COUMADIN) 5 MG tablet Take 5 mg by mouth daily. On Tuesdays and Thursdays takes 1 1/2 (5 mg tablets).  On Monday, Wednesday, Thursday, Saturday and Sunday take 2 (5 mg) tablets.  : Review of Systems:  Out of a complete 14 point review of systems, all are reviewed and negative with the exception of these symptoms as listed below:   Review of Systems  Eyes: Positive for visual disturbance.  Respiratory: Positive for shortness of breath.   Cardiovascular: Positive for leg swelling.  Musculoskeletal: Positive for back pain.  Neurological:       Memory loss, confusion.   Hematological: Bruises/bleeds easily.  Psychiatric/Behavioral:       Change in appetite     Objective:  Neurologic Exam  Physical Exam Physical Examination:   Filed Vitals:    12/08/13 0813  BP: 179/80  Pulse: 93  Temp: 98.9 F (37.2 C)  Resp: 14    General Examination: The patient is a very pleasant 68 y.o. female in no acute distress. She appears well-developed and well-nourished and adequately groomed. She is adequately groomed. She is in good spirits. She is able to provide her history fairly well. She recalls not able to talk clearly and she was on the phone with a friend the night prior and the friend apparently noted that she was not making any sense and told her she would call back. The patient has no lapses in her memory. She is obese.  HEENT: Normocephalic, atraumatic, pupils are equal, round and reactive to light and accommodation. Funduscopic exam is normal with sharp disc margins noted. Extraocular tracking is good without limitation to gaze excursion or nystagmus noted. Normal smooth pursuit is noted. Hearing is grossly intact. Tympanic membranes are clear bilaterally. Face is symmetric with normal facial animation and normal facial sensation. Speech is clear with no dysarthria noted. There is no hypophonia. There is no lip, neck/head, jaw or voice tremor. Neck is supple with full range of passive and active motion. There are no carotid bruits on auscultation. Oropharynx exam reveals: moderate mouth dryness, poor dental hygiene and several missing teeth. There is moderate airway crowding, due to large tongue, and redundant soft palate with longer uvula noted. Tonsils are small. Mallampati is class III. Tongue protrudes centrally and palate elevates symmetrically.  Next size is 17-1/4 inches. She has an absent over bite.  Chest: Clear to auscultation without wheezing, rhonchi or crackles noted.  Heart: S1+S2+0, regular and normal without murmurs, rubs or gallops noted.   Abdomen: Soft, non-tender and non-distended with normal bowel sounds appreciated on auscultation.  Extremities: There is 1+ pitting edema in the distal right leg. She has a scar in  discoloration. She injured her leg before and had surgery to it. Pedal pulses are intact.  Skin: Warm and dry without trophic changes noted.  Musculoskeletal: exam reveals no obvious joint deformities, tenderness or joint swelling or erythema.   Neurologically:  Mental status: The patient is awake, alert and  oriented in all 4 spheres. Her immediate and remote memory, attention, language skills and fund of knowledge are fairly well preserved. She has very minimal dysarthria. She has slight difficulty with comprehension and with following commands but is able to mimic fairly well. She has very mild difficulty with expressive speech. Thought process is linear. Mood is normal and affect is normal.  Her MMSE is 24/30, Cranial nerves II - XII are as described above under HEENT exam. In addition: shoulder shrug is normal with equal shoulder height noted. Motor exam: Normal bulk, strength and tone is noted. There is no drift, tremor or rebound. Romberg is negative. Reflexes are 2+ throughout. Babinski: Toes are flexor bilaterally. Fine motor skills and coordination: intact with normal finger taps, normal hand movements, normal rapid alternating patting, normal foot taps and normal foot agility.  Cerebellar testing: No dysmetria or intention tremor on finger to nose testing. Heel to shin is unremarkable bilaterally. There is no truncal or gait ataxia.  Sensory exam: intact to light touch, pinprick, vibration, temperature sense in the upper and lower extremities.  Gait, station and balance: She stands with mild difficulty. No veering to one side is noted. No leaning to one side is noted. Posture is age-appropriate and stance is narrow based. Gait shows decreased stride length and pace and overall cautious gait. She turns slowly. Tandem walk is not possible for her.               Assessment and Plan:  In summary, TRINNIE CARUSO is a very pleasant 68 y.o.-year old female with an underlying complex medical history  of COPD, A. fib on chronic warfarin therapy, diabetes, anemia, insomnia, legal blindness, mild diastolic heart failure, kidney stones, hypertension, status post tibia fracture in 2010, status post glaucoma surgery in the right, status post bilateral tubal ligation, who presented to [facility] on 12/01/2013 with new onset altered mental status and garbled speech and was found to have embolic left MCA strokes in the context of subtherapeutic INR. She had stopped the Coumadin in preparation for surgery that day. On examination she has no overt weakness or numbness but does seem to have mild difficulty with her language skills. She has mild comprehension issues and has minimal expressive dysphasia. She has been placed back on Coumadin and her latest INR per daughter was 1.8. She is supposed to have it checked again today. She was placed on baby aspirin during the hospital stay but in the context of low INR, I told the patient and her daughter that once her INR is in the therapeutic range she should not have to be on dual therapy and should be able to come off of the baby aspirin and just stay on Coumadin. We talked about secondary stroke prevention at length today. We talked about proper diabetes management, cholesterol management, blood pressure management, weight management, taking medications on time, exercising regularly, and screening for sleep apnea. She is at risk for obstructive sleep apnea given her history of obesity, enlarged next size, tighter airway and snoring reported as well as significant nocturia reported. She has had an appropriate stroke workup done with the exception of having had a sleep study. I will bring her back for sleep study. She is willing to entertain treatment for sleep apnea in the form of CPAP. I will not order any other tests at this time since she had an appropriate workup done. I reviewed all the test results with the patient and her daughter and explained the  findings. She is scheduled with you next week as I understand. In addition, I would like for her to see one of our stroke doctors and requested a second opinion with Dr. Erlinda Hong. I will see her back after the sleep studies completed. She is encouraged to continue with home health therapies.  Thank you very much for allowing me to participate in the care of this nice patient. If I can be of any further assistance to you please do not hesitate to call me at [phone].  Sincerely,   Star Age, MD, PhD

## 2013-12-08 NOTE — Patient Instructions (Signed)
Based on your symptoms and your exam I believe you are at risk for obstructive sleep apnea or OSA, and I think we should proceed with a sleep study to determine whether you do or do not have OSA and how severe it is. If you have more than mild OSA, I want you to consider treatment with CPAP. Please remember, the risks and ramifications of moderate to severe obstructive sleep apnea or OSA are: Cardiovascular disease, including congestive heart failure, stroke, difficult to control hypertension, arrhythmias, and even type 2 diabetes has been linked to untreated OSA. Sleep apnea causes disruption of sleep and sleep deprivation in most cases, which, in turn, can cause recurrent headaches, problems with memory, mood, concentration, focus, and vigilance. Most people with untreated sleep apnea report excessive daytime sleepiness, which can affect their ability to drive. Please do not drive if you feel sleepy.  I will see you back after your sleep study to go over the test results and where to go from there. We will call you after your sleep study and to set up an appointment at the time.   Continue therapy and take your medications as directed. As discussed, secondary prevention is key after a stroke. This means: taking care of blood sugar values or diabetes management, good blood pressure (hypertension) control and optimizing cholesterol management, exercising daily or regularly within your own mobility limitations of course, and overall cardiovascular risk factor reduction, which includes screening for and treatment of obstructive sleep apnea (OSA) and weight management.   We will set you up with a second opinion with one of our stroke doctors to make sure you're stroke management is optimized. From my end of things, once your INR is in the therapeutic range she can stop the baby aspirin. I will write back to Dr. Woody Seller.

## 2013-12-28 ENCOUNTER — Encounter: Payer: Self-pay | Admitting: Neurology

## 2013-12-28 ENCOUNTER — Ambulatory Visit (INDEPENDENT_AMBULATORY_CARE_PROVIDER_SITE_OTHER): Payer: PRIVATE HEALTH INSURANCE | Admitting: Neurology

## 2013-12-28 VITALS — BP 126/68 | HR 63 | Ht 68.0 in | Wt 230.0 lb

## 2013-12-28 DIAGNOSIS — I63412 Cerebral infarction due to embolism of left middle cerebral artery: Secondary | ICD-10-CM

## 2013-12-28 DIAGNOSIS — H5440 Blindness, one eye, unspecified eye: Secondary | ICD-10-CM | POA: Insufficient documentation

## 2013-12-28 DIAGNOSIS — E119 Type 2 diabetes mellitus without complications: Secondary | ICD-10-CM | POA: Insufficient documentation

## 2013-12-28 DIAGNOSIS — I481 Persistent atrial fibrillation: Secondary | ICD-10-CM

## 2013-12-28 DIAGNOSIS — I4819 Other persistent atrial fibrillation: Secondary | ICD-10-CM

## 2013-12-28 DIAGNOSIS — H409 Unspecified glaucoma: Secondary | ICD-10-CM | POA: Insufficient documentation

## 2013-12-28 DIAGNOSIS — H5442 Blindness, left eye, normal vision right eye: Secondary | ICD-10-CM

## 2013-12-28 HISTORY — DX: Type 2 diabetes mellitus without complications: E11.9

## 2013-12-28 NOTE — Progress Notes (Signed)
STROKE NEUROLOGY FOLLOW UP NOTE  NAME: Bethany [name] DOB: 06/20/1945  REASON FOR VISIT: stroke follow up HISTORY FROM: chart and pt  Today we had the pleasure of seeing Bethany [name] in follow-up at our Neurology Clinic. Pt was accompanied by daughter.   History Summary Bethany [name] is a 68 year old right-handed female with PMH of A. fib on chronic warfarin therapy, diabetes, HTN,  mild diastolic heart failure, status post glaucoma surgery in the right and left eye blindness who presented to [facility] on 12/01/2013 with acute onset AMS and expressive aphasia. As per chart, she was fine the day prior and had stopped her coumadin, per instructions in preparation for an elective hernia surgery that day. She lives alone. She had no dysarthria or focal weakness at the time. Of note, INR was subtherapeutic at 1.1. 2D echo showed an EF of 65%. An EKG showed A. fib. Carotid Doppler studies on 12/02/2013 showed mild carotid bulb and proximal ICA plaque, right greater than left, resulting in less than 50% diameter stenosis. MRI brain without contrast on 12/02/2013 showed patchy acute left MCA infarcts, confluent at the posterior insula and left superior temporal gyrus, no mass effect or hemorrhage, mild underlying chronic small vessel ischemia. She was put back on coumadin bridged with Lovenox. She was also started on a baby ASA in the hospital. She was discharged to home with home health occupational, physical, speech therapy and nursing.   She was seen in clinic by Dr. Rexene Alberts on 12/08/13. At that time, She has done well since her hospital discharge. She is able to dress herself. At baseline, she was taking Coumadin 7.5 mg on 2 days of the week and 10 mg the other days. She had her INR checked typically once a month. In the past she was able to successfully stop the Coumadin for her eye surgery without any issues at the time. She denies any headaches. She denies any weakness at this time. Her  thinking is much better. Her daughter, she is almost back to baseline.  She has signs and symptoms of OSA. She snores and has some tiredness during the day. She has nocturia 4-5 times per night. She does take a diuretic twice daily, with the later dose at 4 PM. She will continue to follow up with Dr. Rexene Alberts for the sleep study evaluation.  She does not drive. She does not drink alcohol and per daughter was a heavy drinker in the past and quit in 96. She stopped smoking in 2003. She had 3 children, only one alive.   Interval History During the interval time, the patient has been doing well. She was referred to stroke clinic for her recent left MCA stroke. She apparently recovered well with minimal defect on language. She stated that her last INR check was 2.9 on 12/15/13. She will have next one check on 01/11/14. She checks her BP and glucose at home and today BP was 126/68 in clinic. Her glucose this am was 137. She continued to have vision difficulty with right eye s/p glaucoma surgery and left eye completely blind.       REVIEW OF SYSTEMS: Full 14 system review of systems performed and notable only for those listed below and in HPI above, all others are negative:  Constitutional: N/A  Cardiovascular: leg swelling  Ear/Nose/Throat: N/A  Skin: N/A  Eyes: loss of vision  Respiratory: N/A  Gastroitestinal: N/A  Genitourinary: N/A Hematology/Lymphatic: bruise/bleed easily  Endocrine: N/A  Musculoskeletal: N/A  Allergy/Immunology: N/A  Neurological: mild memory loss  Psychiatric: confusion  The following represents the patient's updated allergies and side effects list: Allergies  Allergen Reactions  . Sulfa Antibiotics     Swelling, hives  . Tape     Skin irritation and breakdown    Labs since last visit of relevance include the following: Results for orders placed or performed during the hospital encounter of 08/31/10  Glucose, capillary  Result Value Ref Range   Glucose-Capillary  155 (H) 70 - 99 mg/dL    The neurologically relevant items on the patient's problem list were reviewed on today's visit.  Physical exam  BP 126/68 mmHg  Pulse 63  Ht 5\' 8"  (1.727 m)  Wt 230 lb (104.327 kg)  BMI 34.98 kg/m2  General - Well nourished, well developed, in no apparent distress.  Ophthalmologic - not able to see through.  Cardiovascular - irregular irregular heart rate and rhythm.  Mental Status -  Level of arousal and orientation to time, place, and person were intact. Language including expression, naming, comprehension was assessed and found intact, however, her repetition was partially impaired. Fund of Knowledge was assessed and was impaired, did not know the names of presidents.  Cranial Nerves II - XII - II - left eye completely blind, not able to have light perception. Right eye had only central vision, visual acuity also impaired but able to count fingers. III, IV, VI - Extraocular movements intact, but left pupil was dilated without direct light reaction. V - Facial sensation intact bilaterally. VII - Facial movement intact bilaterally. VIII - Hearing & vestibular intact bilaterally. X - Palate elevates symmetrically. XI - Chin turning & shoulder shrug intact bilaterally. XII - Tongue protrusion intact.  Motor Strength - The patient's strength was normal in all extremities and pronator drift was absent.  Bulk was normal and fasciculations were absent.   Motor Tone - Muscle tone was assessed at the neck and appendages and was normal.  Reflexes - The patient's reflexes were 1+ in all extremities and she had no pathological reflexes.  Sensory - Light touch, temperature/pinprick were assessed and were normal.    Coordination - The patient had normal movements in the hands with no ataxia or dysmetria.  Tremor was absent.  Gait and Station - broad based gait, slow small stride, en bloc turns.  Data reviewed: I personally reviewed the images and agree with  the radiology interpretations.  MRI brain 12/02/13 1. Patchy acute left MCA infarcts, confluent at the posterior insula and left superior temporal gyrus. No mass effect or hemorrhage. 2. Mild underlying chronic small vessel ischemia. 3. Central gyrus old SDH without mass effect.   2D echo - EF of 65%.   EKG - A. fib.   Carotid Doppler 12/02/2013  Mild carotid bulb and proximal ICA plaque, right greater than left, resulting in less than 50% diameter stenosis.  Assessment: As you may recall, she is a 68 y.o. African American female with PMH of Afib on chronic warfarin therapy, diabetes, HTN,  mild diastolic heart failure, status post glaucoma surgery in the right and left eye blindness who presented to [facility] on 12/01/2013 with acute onset AMS and expressive aphasia. MRI showed left MCA embolic stroke likely due to coumadin on hold for surgery. She was put back on coumadin bridged with lovenox. Currently, her INR 2.9 and she is off lovenox and ASA. Her stroke work up almost completely except intracranial vessel image, such as CTA, MRA or TCD.  However, this will not change her management. She recovered well with only deficit on repetition. Her vision difficulty is due to her glaucoma and diabetic retinopathy.   Plan:  - continue coumadin and INR goal 2-3 - continue lipitor for HLD and stroke prevention - Follow up with your primary care physician for stroke risk factor modification. Recommend maintain blood pressure goal <130/80, diabetes with hemoglobin A1c goal below 6.5% and lipids with LDL cholesterol goal below 70 mg/dL.  - continue to follow up with Dr. Rexene Alberts for OSA and sleep study - continue to follow up with ophthalmologist for right eye glaucoma and DM retinopathy - RTC PRN.  No orders of the defined types were placed in this encounter.    Patient Instructions  - continue coumadin for stroke prevention, check INR as scheduled and goal is 2-3 - continue lipitor  for stroke prevetion and HLD - Follow up with your primary care physician for stroke risk factor modification. Recommend maintain blood pressure goal <130/80, diabetes with hemoglobin A1c goal below 6.5% and lipids with LDL cholesterol goal below 70 mg/dL.  - continue the exercise that PT taught you. - continue to follow up with Dr. Rexene Alberts for OSA. - continue to follow up with eye doctor for glaucoma and DM retinopathy.  - follow up in clinic as needed.   Rosalin Hawking, MD PhD [facility] [address], [place], [place] [address] [phone]

## 2013-12-28 NOTE — Patient Instructions (Addendum)
-   continue coumadin for stroke prevention, check INR as scheduled and goal is 2-3 - continue lipitor for stroke prevetion and HLD - Follow up with your primary care physician for stroke risk factor modification. Recommend maintain blood pressure goal <130/80, diabetes with hemoglobin A1c goal below 6.5% and lipids with LDL cholesterol goal below 70 mg/dL.  - continue the exercise that PT taught you. - continue to follow up with Dr. Rexene Alberts for OSA. - continue to follow up with eye doctor for glaucoma and DM retinopathy.  - follow up in clinic as needed.

## 2014-01-14 ENCOUNTER — Ambulatory Visit (INDEPENDENT_AMBULATORY_CARE_PROVIDER_SITE_OTHER): Payer: PRIVATE HEALTH INSURANCE | Admitting: Neurology

## 2014-01-14 VITALS — BP 170/79

## 2014-01-14 DIAGNOSIS — R9431 Abnormal electrocardiogram [ECG] [EKG]: Secondary | ICD-10-CM

## 2014-01-14 DIAGNOSIS — G4720 Circadian rhythm sleep disorder, unspecified type: Secondary | ICD-10-CM

## 2014-01-14 DIAGNOSIS — R0683 Snoring: Secondary | ICD-10-CM

## 2014-01-14 DIAGNOSIS — G478 Other sleep disorders: Secondary | ICD-10-CM

## 2014-01-14 NOTE — Sleep Study (Signed)
Please see the scanned sleep study interpretation located in the Procedure tab within the Chart Review section. 

## 2014-01-20 ENCOUNTER — Telehealth: Payer: Self-pay | Admitting: Neurology

## 2014-01-20 NOTE — Telephone Encounter (Signed)
Please call and notify the patient that the recent sleep study did not show any significant obstructive sleep apnea. Please inform patient that I would like to go over the details of the study during a follow up appointment. Pls arrange a followup appointment in 2 months. Also, route or fax report to PCP and referring MD, if other than PCP.  Once you have spoken to patient, you can close this encounter.   Thanks,  Star Age, MD, PhD [facility])

## 2014-01-25 ENCOUNTER — Encounter: Payer: Self-pay | Admitting: Neurology

## 2014-01-25 NOTE — Telephone Encounter (Signed)
Scheduled appt for patient on February 02, 2014 at 8:30am for follow up to discuss sleep results and treatment per Dr. Rexene Alberts request. Mailed sleep results to patient and results have been faxed to referring provider.

## 2014-02-02 ENCOUNTER — Ambulatory Visit (INDEPENDENT_AMBULATORY_CARE_PROVIDER_SITE_OTHER): Payer: PRIVATE HEALTH INSURANCE | Admitting: Neurology

## 2014-02-02 ENCOUNTER — Encounter: Payer: Self-pay | Admitting: Neurology

## 2014-02-02 VITALS — BP 136/53 | HR 59 | Temp 97.0°F | Ht 68.0 in | Wt 236.0 lb

## 2014-02-02 DIAGNOSIS — I4819 Other persistent atrial fibrillation: Secondary | ICD-10-CM

## 2014-02-02 DIAGNOSIS — E669 Obesity, unspecified: Secondary | ICD-10-CM

## 2014-02-02 DIAGNOSIS — H5442 Blindness, left eye, normal vision right eye: Secondary | ICD-10-CM

## 2014-02-02 DIAGNOSIS — R351 Nocturia: Secondary | ICD-10-CM

## 2014-02-02 DIAGNOSIS — R0683 Snoring: Secondary | ICD-10-CM

## 2014-02-02 DIAGNOSIS — I63412 Cerebral infarction due to embolism of left middle cerebral artery: Secondary | ICD-10-CM

## 2014-02-02 DIAGNOSIS — H5440 Blindness, one eye, unspecified eye: Secondary | ICD-10-CM

## 2014-02-02 DIAGNOSIS — I481 Persistent atrial fibrillation: Secondary | ICD-10-CM

## 2014-02-02 NOTE — Patient Instructions (Signed)
Continue exercising regularly and take your medications as directed. As discussed, secondary prevention is key after a stroke. This means: taking care of blood sugar values or diabetes management, good blood pressure (hypertension) control and optimizing cholesterol management, exercising daily or regularly within your own mobility limitations of course, and overall cardiovascular risk factor reduction, which includes screening for and treatment of obstructive sleep apnea (OSA) and weight management.   We may try to do a home sleep test in the future to make sure you don't have to be treated for sleep apnea.

## 2014-02-02 NOTE — Progress Notes (Signed)
Subjective:    Patient ID: Bethany [name] is a 68 y.o. female.  HPI     Interim history:   Bethany [name] is a very pleasant 68 year old right-handed woman with an underlying medical history of COPD, A. fib on chronic warfarin therapy, diabetes, anemia, insomnia, legal blindness, mild diastolic heart failure, kidney stones, hypertension, status post tibia fracture in 2010, status post glaucoma surgery in the right, status post bilateral tubal ligation, who presents for follow-up consultation of Bethany [name] embolic strokes in the context of subtherapeutic INR due to Coumadin stopped. The patient is accompanied by Bethany [name] daughter again today. I first met Bethany [name] on 12/08/2013 at the request of Bethany [name] primary care physician, at which time the patient reported a stroke in October. She had regained most of Bethany [name] strength in Bethany [name] speech. She was doing well at the time. She had had workup and we talked about workup and secondary stroke prevention. I requested a second opinion with our stroke specialist Dr. Erlinda Hong, whom she saw on 12/28/2013. I appreciate his input. He suggested she continue with Coumadin. I requested that she return for sleep study for evaluation of underlying obstructive sleep apnea. She had a sleep study on 01/14/2014 and underwent over Bethany [name] test results with Bethany [name] in detail today. Bethany [name] sleep efficiency was 63.2% with a latency to sleep of 0 minutes and wake after sleep onset high at 186 minutes with moderate sleep fragmentation noted. She had for longer periods of wakefulness. Arousal index was normal. She had a markedly increased percentage of stage II sleep and a decreased percentage of slow-wave sleep and absence of REM sleep. She had evidence of A. fib on EKG. She had no significant periodic leg movements. She had moderate to loud snoring. She did not sleep much on Bethany [name] back. She had a total AHI of 4.7 per hour. This line oxygen saturation was 91%. Oxygen nadir was 86%.  Today, she reports no new symptoms. She has been  seeing Bethany [name] primary care physician regularly. Bethany [name] latest INR was 2.4. She is off of aspirin. She reports not having slept well in the sleep lab because she does not sleep well outside Bethany [name] home. In addition she had to go to the bathroom multiple times. She says she had diarrhea that day and had eaten something that may have caused the diarrhea.  She had presented to [facility] on 12/01/2013 with new onset altered mental status which per daughter was quite acute. She was fine the day prior and had stopped Bethany [name] coumadin, per instructions in preparation for an elective hernia surgery that day. She lives alone. She had no dysarthria or focal weakness at the time. I reviewed hospital records from Bethany [name] had [facility] in [place], [place]. Blood work included a CMP and TSH, CBC with differential, all of which were unremarkable with the exception of a glucose level of 175, urinalysis was negative. Of note, INR was subtherapeutic at 1.1. An echocardiogram showed an EF of 65%, LVH, otherwise unremarkable per your progress report on 12/02/2013. She was noted to have expressive aphasia she was diagnosed with left MCA stroke, embolic. An EKG showed A. fib. Carotid Doppler studies on 12/02/2013 showed mild carotid bulb and proximal ICA plaque, right greater than left, resulting in less than 50% diameter stenosis. MRI brain without contrast on 12/02/2013 showed patchy acute left MCA infarcts, confluent at the posterior insula and left superior temporal gyrus, no mass effect or hemorrhage, mild underlying chronic small vessel ischemia. X-ray left toes on  12/01/2013 showed showed slightly displaced and angulated fracture of the distal metaphysis of the proximal phalanx of the left fourth digit. CT head without contrast on 12/01/2013 showed no acute abnormality. Portable x-ray chest on 12/01/2013 showed mild cardiomegaly.   She was bridged with Lovenox, which she is still getting. Bethany [name] latest INR was 1.8,  she is on 10 mg of coumadin. Bethany [name] daughter has been staying with Bethany [name]. She was also started on a baby ASA in the hospital. She does not drive. She does not drink alcohol and per daughter was a heavy drinker in the past and quit in 96. She stopped smoking in 2003. She had 3 children, only one alive.   She was discharged to home with home health occupational, physical, speech therapy and nursing.   She snores. She has some tiredness during the day. She has nocturia 4-5 times per night. She does take a diuretic twice daily, with the later dose at 4 PM. She has done well since Bethany [name] hospital discharge. She is able to dress herself. She is supposed to get Bethany [name] INR checked today. At baseline, she was taking Coumadin 7.5 mg on 2 days of the week and 10 mg the other days. She had Bethany [name] INR checked typically once a month. In the past she was able to successfully stop the Coumadin for Bethany [name] eye surgery without any issues at the time. She denies any headaches.   Bethany [name] Past Medical History Is Significant For: Past Medical History  Diagnosis Date  . COPD (chronic obstructive pulmonary disease)   . Hypertension   . Diabetes mellitus without complication   . Atrial fibrillation   . Anemia   . Insomnia   . Legally blind in left eye, as defined in [place]   . Hypercholesteremia   . Enlarged heart     Bethany [name] Past Surgical History Is Significant For: Past Surgical History  Procedure Laterality Date  . Bilareral tubal ligation    . Removal of sweat glands      per pt. 30 years ago  . Glaucoma surgery Right   . Tibia fracture surgery Right 2010    Bethany [name] Family History Is Significant For: Family History  Problem Relation Age of Onset  . Heart disease Mother   . Hypertension Mother   . Hypertension Father     Bethany [name] Social History Is Significant For: History   Social History  . Marital Status: Divorced    Spouse Name: N/A    Number of Children: 3  . Years of Education: 76 th    Social History Main Topics  . Smoking  status: Former Smoker    Quit date: 02/26/2001  . Smokeless tobacco: Never Used  . Alcohol Use: No  . Drug Use: No  . Sexual Activity: None   Other Topics Concern  . None   Social History Narrative   Caffeine 1 cup daily.          Divorced. Home alone.  3 kids, (2 deceased).  11th grade.    Bethany [name] Allergies Are:  Allergies  Allergen Reactions  . Sulfa Antibiotics     Swelling, hives  . Tape     Skin irritation and breakdown  :   Bethany [name] Current Medications Are:  Outpatient Encounter Prescriptions as of 02/02/2014  Medication Sig  . albuterol (PROVENTIL HFA;VENTOLIN HFA) 108 (90 BASE) MCG/ACT inhaler Inhale 2 puffs into the lungs every 6 (six) hours as needed for wheezing or shortness of breath.  Marland Kitchen atorvastatin (LIPITOR) 10 MG  tablet Take 10 mg by mouth daily.  . Blood Glucose Monitoring Suppl (FORA V30A BLOOD GLUCOSE SYSTEM) DEVI by Does not apply route.  . budesonide-formoterol (SYMBICORT) 160-4.5 MCG/ACT inhaler Inhale 2 puffs into the lungs 2 (two) times daily.  . cloNIDine (CATAPRES) 0.2 MG tablet Take 0.1 mg by mouth 2 (two) times daily. As needed for systolic BP > 174  . diltiazem (CARDIZEM CD) 240 MG 24 hr capsule Take 240 mg by mouth daily.  . Dorzolamide HCl-Timolol Mal PF 22.3-6.8 MG/ML SOLN Apply to eye.  . esomeprazole (NEXIUM) 40 MG capsule Take 40 mg by mouth daily at 12 noon.  . furosemide (LASIX) 40 MG tablet Take 40 mg by mouth. 1 tab am, 1/2 tab in pm  . hydrALAZINE (APRESOLINE) 25 MG tablet Take 50 mg by mouth 2 (two) times daily.   . Iron, Ferrous Gluconate, 256 (28 FE) MG TABS Take 1 tablet by mouth daily.  Marland Kitchen labetalol (NORMODYNE) 200 MG tablet Take 200 mg by mouth 2 (two) times daily.  . Magnesium 250 MG TABS Take 250 mg by mouth daily.  . metFORMIN (GLUCOPHAGE) 500 MG tablet Take 500 mg by mouth. One tablet twice daily  . potassium chloride (K-DUR) 10 MEQ tablet Take 20 mEq by mouth daily. 1 1/2 tablet daily,Take with lasix  . tamsulosin (FLOMAX) 0.4 MG CAPS  capsule Take 0.4 mg by mouth daily after breakfast.  . traMADol (ULTRAM) 50 MG tablet Take 50 mg by mouth every 6 (six) hours as needed.  . valsartan (DIOVAN) 160 MG tablet Take 160 mg by mouth 2 (two) times daily.   Marland Kitchen warfarin (COUMADIN) 5 MG tablet Take 5 mg by mouth daily. On Tuesdays and Thursdays takes 1 1/2 (5 mg tablets).  On Monday, Wednesday, Thursday, Saturday and Sunday take 2 (5 mg) tablets.  . [DISCONTINUED] aspirin (ASPIRIN EC) 81 MG EC tablet Take 81 mg by mouth. Swallow whole.  :  Review of Systems:  Out of a complete 14 point review of systems, all are reviewed and negative with the exception of these symptoms as listed below:   Review of Systems  Eyes:       Loss of vision (left eye)  Skin:       moles  Neurological:       Snoring  Hematological: Bruises/bleeds easily.  Psychiatric/Behavioral:       Little  Bit confusion    Objective:  Neurologic Exam  Physical Exam Physical Examination:   Filed Vitals:   02/02/14 0826  BP: 136/53  Pulse: 59  Temp: 97 F (36.1 C)    General Examination: The patient is a very pleasant 68 y.o. female in no acute distress. She appears well-developed and well-nourished and adequately groomed. She is adequately groomed. She is obese.  HEENT: Normocephalic, atraumatic, pupils are equal, round and reactive to light and accommodation but she has limited vision on the left. Funduscopic exam is normal with sharp disc margins noted. Extraocular tracking is good without limitation to gaze excursion or nystagmus noted. Normal smooth pursuit is noted. Hearing is grossly intact. Face is symmetric with normal facial animation and normal facial sensation. Speech is clear with no dysarthria noted. There is no hypophonia. There is no lip, neck/head, jaw or voice tremor. Neck is supple with full range of passive and active motion. There are no carotid bruits on auscultation. Oropharynx exam reveals: moderate mouth dryness, poor dental hygiene and  several missing teeth. There is moderate airway crowding, due to large tongue,  and redundant soft palate with longer uvula noted. Tonsils are small. Mallampati is class III. Tongue protrudes centrally and palate elevates symmetrically.    Chest: Clear to auscultation without wheezing, rhonchi or crackles noted.  Heart:  Irregularly irregular.   Abdomen: Soft, non-tender and non-distended with normal bowel sounds appreciated on auscultation.  Extremities: There is 1 to 2+ pitting edema in the distal right leg. She has a scar and  discoloration. She injured Bethany [name] leg before and had surgery to it. Pedal pulses are intact. she has trace edema in the left leg.   Skin: Warm and dry without trophic changes noted.  Musculoskeletal: exam reveals no obvious joint deformities, tenderness or joint swelling or erythema.   Neurologically:  Mental status: The patient is awake, alert and oriented in all 4 spheres. Bethany [name] immediate and remote memory, attention, language skills and fund of knowledge are fairly well preserved. She has very minimal dysarthria. She has slight difficulty with comprehension and with following commands but is able to mimic fairly well. She has very mild difficulty with expressive speech. Thought process is linear. Mood is normal and affect is normal.  Cranial nerves II - XII are as described above under HEENT exam. In addition: shoulder shrug is normal with equal shoulder height noted. Motor exam: Normal bulk, strength and tone is noted. There is no drift, tremor or rebound. Romberg is negative. Reflexes are 2+ throughout. Babinski: Toes are flexor bilaterally. Fine motor skills and coordination: intact with normal finger taps, normal hand movements, normal rapid alternating patting, normal foot taps and normal foot agility.  Cerebellar testing: No dysmetria or intention tremor on finger to nose testing. Heel to shin is unremarkable bilaterally. There is no truncal or gait ataxia.  Sensory  exam: intact to light touch, pinprick, vibration, temperature sense in the upper and lower extremities.  Gait, station and balance: She stands with mild difficulty. No veering to one side is noted. No leaning to one side is noted. Posture is age-appropriate and stance is narrow based. Gait shows decreased stride length and pace and overall cautious gait.  she has a slight limp. She turns slowly. Tandem walk is not possible for Bethany [name].               Assessment and Plan:  In summary, AXIE HAYNE is a very pleasant 68 year old female with an underlying complex medical history of COPD, A. fib on chronic warfarin therapy, diabetes, anemia, insomnia, legal blindness, mild diastolic heart failure, kidney stones, hypertension, status post tibia fracture in 2010, status post glaucoma surgery in the right, status post bilateral tubal ligation, who presented to [facility] on 12/01[facility]2013 with new onset altered mental status and garbled speech and was found to have embolic left MCA strokes in the context of subtherapeutic INR. She had stopped the Coumadin in preparation for hernia surgery that day. On examination she has no overt weakness or numbness but does seem to have mild difficulty with Bethany [name] language skills. She has mild comprehension issues and has minimal expressive dysphasia, all of this unchanged. She had a recent sleep study, which was limited by the fact, she had poor sleep consolidation and absence of REM sleep. As it stands she does not have sleep apnea that needs to be treated with CPAP therapy with the above-mentioned limitations however. Bethany [name] latest INR is 2.4. She has been placed back on Coumadin and is off of aspirin. She has had no new TIA or strokelike symptoms. She has finished health  therapy. She has been seeing Bethany [name] primary care physician on a leave monthly basis particularly for INR check. She has an appointment with him next month. Bethany [name] exam is stable for me. At some point in the near  future I would like to consider redoing Bethany [name] sleep apnea test with a home sleep test. She clearly did not sleep well in the lab. She does mention that she had to go to the bathroom multiple times. She had some diarrhea as well. She may do better when she is at home. We talked about secondary stroke prevention again today. This includes diabetes management, cholesterol management, blood pressure management, weight management, taking medications on time, exercising regularly, and screening for sleep apnea. She is at risk for obstructive sleep apnea given Bethany [name] history of obesity, enlarged next size, tighter airway and snoring reported as well as significant nocturia reported. She has had an appropriate stroke workup done and had a second opinion with Dr. Erlinda Hong, which I appreciate. I will see Bethany [name] back routinely in 6 months, sooner if the need arises. She and Bethany [name] daughter are advised that if she were to have any sudden onset of TIA or strokelike symptoms to call 911 or go to the emergency room if she can mobilize.

## 2014-04-28 ENCOUNTER — Encounter: Payer: Self-pay | Admitting: Family

## 2014-04-29 ENCOUNTER — Ambulatory Visit (HOSPITAL_COMMUNITY)
Admission: RE | Admit: 2014-04-29 | Discharge: 2014-04-29 | Disposition: A | Discharge status: Home / Self Care | Payer: Medicare Other | Source: Ambulatory Visit | Attending: Family | Admitting: Family

## 2014-04-29 ENCOUNTER — Ambulatory Visit (INDEPENDENT_AMBULATORY_CARE_PROVIDER_SITE_OTHER): Payer: Medicare Other | Admitting: Family

## 2014-04-29 ENCOUNTER — Encounter: Payer: Self-pay | Admitting: Family

## 2014-04-29 VITALS — BP 146/66 | HR 55 | Resp 16 | Ht 68.0 in | Wt 240.0 lb

## 2014-04-29 DIAGNOSIS — I872 Venous insufficiency (chronic) (peripheral): Secondary | ICD-10-CM | POA: Diagnosis not present

## 2014-04-29 DIAGNOSIS — I739 Peripheral vascular disease, unspecified: Secondary | ICD-10-CM | POA: Diagnosis not present

## 2014-04-29 DIAGNOSIS — Z7901 Long term (current) use of anticoagulants: Secondary | ICD-10-CM | POA: Insufficient documentation

## 2014-04-29 DIAGNOSIS — Z8673 Personal history of transient ischemic attack (TIA), and cerebral infarction without residual deficits: Secondary | ICD-10-CM | POA: Insufficient documentation

## 2014-04-29 DIAGNOSIS — I482 Chronic atrial fibrillation, unspecified: Secondary | ICD-10-CM

## 2014-04-29 DIAGNOSIS — E669 Obesity, unspecified: Secondary | ICD-10-CM | POA: Insufficient documentation

## 2014-04-29 DIAGNOSIS — M7989 Other specified soft tissue disorders: Secondary | ICD-10-CM | POA: Insufficient documentation

## 2014-04-29 NOTE — Progress Notes (Signed)
[facility]ORY [facility]ICAL -PAD  History of Present Illness Bethany [name] is a 69 y.o. female patient of Dr. Oneida Alar who presents for evaluation of peripheral arterial disease. She was noted to have an abnormal non invasive arterial study on screening July 2015. She is asymptomatic. She denies claudication, rest pain, or non healing wounds.    She does not walk much to elicit claudication. Pt's daughter voiced concern re patient's right lower leg swelling.  Pt Diabetic: Yes Pt smoker: former smoker, quit in 2003  Pt meds include: Statin :Yes ASA: No Other anticoagulants/antiplatelets: coumadin for atrial fib  Past Medical History  Diagnosis Date  . COPD (chronic obstructive pulmonary disease)   . Hypertension   . Diabetes mellitus without complication   . Atrial fibrillation   . Anemia   . Insomnia   . Legally blind in left eye, as defined in [place]   . Hypercholesteremia   . Enlarged heart     Social History History  Substance Use Topics  . Smoking status: Former Smoker    Quit date: 02/26/2001  . Smokeless tobacco: Never Used  . Alcohol Use: No    Family History Family History  Problem Relation Age of Onset  . Heart disease Mother   . Hypertension Mother   . Hypertension Father     Past Surgical History  Procedure Laterality Date  . Bilareral tubal ligation    . Removal of sweat glands      per pt. 30 years ago  . Glaucoma surgery Right   . Tibia fracture surgery Right 2010  . Fracture surgery Right 2005    Fx  leg    Allergies  Allergen Reactions  . Sulfa Antibiotics     Swelling, hives  . Tape     Skin irritation and breakdown    Current Outpatient Prescriptions  Medication Sig Dispense Refill  . albuterol (PROVENTIL HFA;VENTOLIN HFA) 108 (90 BASE) MCG/ACT inhaler Inhale 2 puffs into the lungs every 6 (six) hours as needed for wheezing or shortness of breath.    Marland Kitchen albuterol (PROVENTIL HFA;VENTOLIN HFA) 108 (90  BASE) MCG/ACT inhaler 2 puffs.    Marland Kitchen atorvastatin (LIPITOR) 10 MG tablet Take 10 mg by mouth daily.    Marland Kitchen b complex vitamins capsule Take 1 capsule by mouth daily.    . Blood Glucose Monitoring Suppl (FORA V30A BLOOD GLUCOSE SYSTEM) DEVI by Does not apply route.    . budesonide-formoterol (SYMBICORT) 160-4.5 MCG/ACT inhaler Inhale 2 puffs into the lungs 2 (two) times daily.    . cloNIDine (CATAPRES) 0.2 MG tablet Take 0.1 mg by mouth 2 (two) times daily. As needed for systolic BP > 123XX123    . diltiazem (CARDIZEM CD) 240 MG 24 hr capsule Take 240 mg by mouth daily.    . Dorzolamide HCl-Timolol Mal PF 22.3-6.8 MG/ML SOLN Apply to eye.    . esomeprazole (NEXIUM) 40 MG capsule Take 40 mg by mouth daily at 12 noon.    . furosemide (LASIX) 40 MG tablet Take 40 mg by mouth. 1 tab am, 1/2 tab in pm    . hydrALAZINE (APRESOLINE) 25 MG tablet Take 50 mg by mouth 2 (two) times daily.     . Iron, Ferrous Gluconate, 256 (28 FE) MG TABS Take 324 mg by mouth daily.     Marland Kitchen labetalol (NORMODYNE) 200 MG tablet Take 200 mg by mouth 2 (two) times daily. 2 Tabs. By mouth BID    .  Magnesium 250 MG TABS Take 250 mg by mouth daily.    . metFORMIN (GLUCOPHAGE) 500 MG tablet Take 500 mg by mouth. One tablet twice daily    . potassium chloride (K-DUR) 10 MEQ tablet Take 20 mEq by mouth daily. 1 1/2 tablet daily,Take with lasix    . tamsulosin (FLOMAX) 0.4 MG CAPS capsule Take 0.4 mg by mouth daily after breakfast.    . traMADol (ULTRAM) 50 MG tablet Take 50 mg by mouth every 6 (six) hours as needed.    . valsartan (DIOVAN) 160 MG tablet Take 160 mg by mouth 2 (two) times daily.     Marland Kitchen warfarin (COUMADIN) 5 MG tablet Take 7.5 mg by mouth daily. On Tuesdays and Thursdays takes 1 1/2 (5 mg tablets).  On Monday, Wednesday, Thursday, Saturday and Sunday take 2 (5 mg) tablets.     No current facility-administered medications for this visit.    ROS: See HPI for pertinent positives and negatives.   Physical Examination  Filed  Vitals:   04/29/14 1034  BP: 146/66  Pulse: 55  Resp: 16  Height: 5\' 8"  (1.727 m)  Weight: 240 lb (108.863 kg)  SpO2: 96%   Body mass index is 36.5 kg/(m^2).  General: A&O x 3, WDWN, obese female. Gait: slow, using walker Eyes: PERRLA. Pulmonary: CTAB, without wheezes , rales or rhonchi. Cardiac: Irregular Rythm.         Carotid Bruits Right Left   Negative Negative  Aorta is not palpable. Radial pulses: are 2+ palpable                           VASCULAR EXAM: Extremities without ischemic changes, without Gangrene; without open wounds. Right lower leg with 2+ pitting and 3+ nonpitting edema, hemosiderin deposits indicating chronic venous insufficiency. Left lower leg with 1+ pitting edema.                                                                                                          LE Pulses Right Left       FEMORAL  not palpable  not palpable        POPLITEAL  not palpable   not palpable       POSTERIOR TIBIAL  not palpable   not palpable        DORSALIS PEDIS      ANTERIOR TIBIAL not palpable  not palpable    Abdomen: soft, NT, no palpable masses. Skin: no rashes, no ulcers, see extremities. Musculoskeletal: no muscle wasting or atrophy.  Neurologic: A&O X 2; Appropriate Affect, MOTOR FUNCTION:  moving all extremities equally, motor strength 4/5 throughout. Speech is sparse. CN 2-12 intact.    Non-Invasive Vascular Imaging: DATE: 04/29/2014 ABI: RIGHT: 0.97 (10/21/13, 0.95), Waveforms: tri and biphasic;  LEFT: 0.68, 1.04) , Waveforms: mono and biphasic   ASSESSMENT: Bethany [name] is a 69 y.o. female who presents for evaluation of peripheral arterial disease. She was noted to have an abnormal non invasive arterial study on screening July  2015. She is asymptomatic. She denies claudication, rest pain, or non healing wounds.    Right LE ABI's remain in the normal range with tri and biphasic waveforms, left ABI has worsened from normal range to evidence of  moderate arterial occlusive disease.  Her right lower leg has 2+ pitting edema and 3+ non pitting edema with hemosiderin deposits suggestive of chronic venous insufficiency.   Dr. Oneida Alar spoke with patient and her daughter and examined patient.  Face to face time with patient was 25 minutes. Over 50% of this time was spent on counseling and coordination of care.   PLAN:  I discussed in depth with the patient the nature of atherosclerosis, and emphasized the importance of maximal medical management including strict control of blood pressure, blood glucose, and lipid levels, obtaining regular exercise, and continued cessation of smoking.  The patient is aware that without maximal medical management the underlying atherosclerotic disease process will progress, limiting the benefit of any interventions.  Based on the patient's vascular studies and examination, pt will return to clinic in 6 months with ABI's.  Knee high 20-30 mm Hg compression hose, right lower leg, venous insufficiency; information given re ETI outlet for medical grade compression hose.  The patient was given information about PAD including signs, symptoms, treatment, what symptoms should prompt the patient to seek immediate medical care, and risk reduction measures to take.  Clemon Chambers, RN, MSN, FNP-C [facility] Phone: [phone]  Clinic MD: Oneida Alar  04/29/2014 10:30 AM

## 2014-04-29 NOTE — Patient Instructions (Addendum)
Peripheral Vascular Disease Peripheral Vascular Disease (PVD), also called Peripheral Arterial Disease (PAD), is a circulation problem caused by cholesterol (atherosclerotic plaque) deposits in the arteries. PVD commonly occurs in the lower extremities (legs) but it can occur in other areas of the body, such as your arms. The cholesterol buildup in the arteries reduces blood flow which can cause pain and other serious problems. The presence of PVD can place a person at risk for Coronary Artery Disease (CAD).  CAUSES  Causes of PVD can be many. It is usually associated with more than one risk factor such as:   High Cholesterol.  Smoking.  Diabetes.  Lack of exercise or inactivity.  High blood pressure (hypertension).  Obesity.  Family history. SYMPTOMS   When the lower extremities are affected, patients with PVD may experience:  Leg pain with exertion or physical activity. This is called INTERMITTENT CLAUDICATION. This may present as cramping or numbness with physical activity. The location of the pain is associated with the level of blockage. For example, blockage at the abdominal level (distal abdominal aorta) may result in buttock or hip pain. Lower leg arterial blockage may result in calf pain.  As PVD becomes more severe, pain can develop with less physical activity.  In people with severe PVD, leg pain may occur at rest.  Other PVD signs and symptoms:  Leg numbness or weakness.  Coldness in the affected leg or foot, especially when compared to the other leg.  A change in leg color.  Patients with significant PVD are more prone to ulcers or sores on toes, feet or legs. These may take longer to heal or may reoccur. The ulcers or sores can become infected.  If signs and symptoms of PVD are ignored, gangrene may occur. This can result in the loss of toes or loss of an entire limb.  Not all leg pain is related to PVD. Other medical conditions can cause leg pain such  as:  Blood clots (embolism) or Deep Vein Thrombosis.  Inflammation of the blood vessels (vasculitis).  Spinal stenosis. DIAGNOSIS  Diagnosis of PVD can involve several different types of tests. These can include:  Pulse Volume Recording Method (PVR). This test is simple, painless and does not involve the use of X-rays. PVR involves measuring and comparing the blood pressure in the arms and legs. An ABI (Ankle-Brachial Index) is calculated. The normal ratio of blood pressures is 1. As this number becomes smaller, it indicates more severe disease.  < 0.95 - indicates significant narrowing in one or more leg vessels.  <0.8 - there will usually be pain in the foot, leg or buttock with exercise.  <0.4 - will usually have pain in the legs at rest.  <0.25 - usually indicates limb threatening PVD.  Doppler detection of pulses in the legs. This test is painless and checks to see if you have a pulses in your legs/feet.  A dye or contrast material (a substance that highlights the blood vessels so they show up on x-ray) may be given to help your caregiver better see the arteries for the following tests. The dye is eliminated from your body by the kidney's. Your caregiver may order blood work to check your kidney function and other laboratory values before the following tests are performed:  Magnetic Resonance Angiography (MRA). An MRA is a picture study of the blood vessels and arteries. The MRA machine uses a large magnet to produce images of the blood vessels.  Computed Tomography Angiography (CTA). A CTA   is a specialized x-ray that looks at how the blood flows in your blood vessels. An IV may be inserted into your arm so contrast dye can be injected.  Angiogram. Is a procedure that uses x-rays to look at your blood vessels. This procedure is minimally invasive, meaning a small incision (cut) is made in your groin. A small tube (catheter) is then inserted into the artery of your groin. The catheter  is guided to the blood vessel or artery your caregiver wants to examine. Contrast dye is injected into the catheter. X-rays are then taken of the blood vessel or artery. After the images are obtained, the catheter is taken out. TREATMENT  Treatment of PVD involves many interventions which may include:  Lifestyle changes:  Quitting smoking.  Exercise.  Following a low fat, low cholesterol diet.  Control of diabetes.  Foot care is very important to the PVD patient. Good foot care can help prevent infection.  Medication:  Cholesterol-lowering medicine.  Blood pressure medicine.  Anti-platelet drugs.  Certain medicines may reduce symptoms of Intermittent Claudication.  Interventional/Surgical options:  Angioplasty. An Angioplasty is a procedure that inflates a balloon in the blocked artery. This opens the blocked artery to improve blood flow.  Stent Implant. A wire mesh tube (stent) is placed in the artery. The stent expands and stays in place, allowing the artery to remain open.  Peripheral Bypass Surgery. This is a surgical procedure that reroutes the blood around a blocked artery to help improve blood flow. This type of procedure may be performed if Angioplasty or stent implants are not an option. SEEK IMMEDIATE MEDICAL CARE IF:   You develop pain or numbness in your arms or legs.  Your arm or leg turns cold, becomes blue in color.  You develop redness, warmth, swelling and pain in your arms or legs. MAKE SURE YOU:   Understand these instructions.  Will watch your condition.  Will get help right away if you are not doing well or get worse. Document Released: 03/22/2004 Document Revised: 05/07/2011 Document Reviewed: 02/17/2008 [employer] Patient Information 2015 [employer]. This information is not intended to replace advice given to you by your health care provider. Make sure you discuss any questions you have with your health care provider.   Venous Stasis or  Chronic Venous Insufficiency Chronic venous insufficiency, also called venous stasis, is a condition that affects the veins in the legs. The condition prevents blood from being pumped through these veins effectively. Blood may no longer be pumped effectively from the legs back to the heart. This condition can range from mild to severe. With proper treatment, you should be able to continue with an active life. CAUSES  Chronic venous insufficiency occurs when the vein walls become stretched, weakened, or damaged or when valves within the vein are damaged. Some common causes of this include:  High blood pressure inside the veins (venous hypertension).  Increased blood pressure in the leg veins from long periods of sitting or standing.  A blood clot that blocks blood flow in a vein (deep vein thrombosis).  Inflammation of a superficial vein (phlebitis) that causes a blood clot to form. RISK FACTORS Various things can make you more likely to develop chronic venous insufficiency, including:  Family history of this condition.  Obesity.  Pregnancy.  Sedentary lifestyle.  Smoking.  Jobs requiring long periods of standing or sitting in one place.  Being a certain age. Women in their 40s and 50s and men in their 70s are more   likely to develop this condition. SIGNS AND SYMPTOMS  Symptoms may include:   Varicose veins.  Skin breakdown or ulcers.  Reddened or discolored skin on the leg.  Brown, smooth, tight, and painful skin just above the ankle, usually on the inside surface (lipodermatosclerosis).  Swelling. DIAGNOSIS  To diagnose this condition, your health care provider will take a medical history and do a physical exam. The following tests may be ordered to confirm the diagnosis:  Duplex ultrasound--A procedure that produces a picture of a blood vessel and nearby organs and also provides information on blood flow through the blood vessel.  Plethysmography--A procedure that tests  blood flow.  A venogram, or venography--A procedure used to look at the veins using X-ray and dye. TREATMENT The goals of treatment are to help you return to an active life and to minimize pain or disability. Treatment will depend on the severity of the condition. Medical procedures may be needed for severe cases. Treatment options may include:   Use of compression stockings. These can help with symptoms and lower the chances of the problem getting worse, but they do not cure the problem.  Sclerotherapy--A procedure involving an injection of a material that "dissolves" the damaged veins. Other veins in the network of blood vessels take over the function of the damaged veins.  Surgery to remove the vein or cut off blood flow through the vein (vein stripping or laser ablation surgery).  Surgery to repair a valve. HOME CARE INSTRUCTIONS   Wear compression stockings as directed by your health care provider.  Only take over-the-counter or prescription medicines for pain, discomfort, or fever as directed by your health care provider.  Follow up with your health care provider as directed. SEEK MEDICAL CARE IF:   You have redness, swelling, or increasing pain in the affected area.  You see a red streak or line that extends up or down from the affected area.  You have a breakdown or loss of skin in the affected area, even if the breakdown is small.  You have an injury to the affected area. SEEK IMMEDIATE MEDICAL CARE IF:   You have an injury and open wound in the affected area.  Your pain is severe and does not improve with medicine.  You have sudden numbness or weakness in the foot or ankle below the affected area, or you have trouble moving your foot or ankle.  You have a fever or persistent symptoms for more than 2-3 days.  You have a fever and your symptoms suddenly get worse. MAKE SURE YOU:   Understand these instructions.  Will watch your condition.  Will get help right  away if you are not doing well or get worse. Document Released: 06/18/2006 Document Revised: 12/03/2012 Document Reviewed: 10/20/2012 [employer] Patient Information 2015 [employer]. This information is not intended to replace advice given to you by your health care provider. Make sure you discuss any questions you have with your health care provider.  

## 2014-05-27 ENCOUNTER — Encounter: Payer: Self-pay | Admitting: Internal Medicine

## 2014-08-05 ENCOUNTER — Ambulatory Visit: Payer: PRIVATE HEALTH INSURANCE | Admitting: Neurology

## 2014-09-15 ENCOUNTER — Ambulatory Visit: Payer: Medicaid Other | Admitting: Neurology

## 2014-10-25 ENCOUNTER — Telehealth: Payer: Self-pay

## 2014-10-25 NOTE — Telephone Encounter (Signed)
I spoke to daughter to see if they wanted to move appt to a day earlier since we have some openings. Daughter stated that they could not move appt.

## 2014-10-27 ENCOUNTER — Ambulatory Visit (INDEPENDENT_AMBULATORY_CARE_PROVIDER_SITE_OTHER): Payer: Medicare Other | Admitting: Neurology

## 2014-10-27 ENCOUNTER — Encounter: Payer: Self-pay | Admitting: Neurology

## 2014-10-27 VITALS — BP 142/60 | HR 58 | Resp 18 | Ht 68.0 in | Wt 226.0 lb

## 2014-10-27 DIAGNOSIS — H409 Unspecified glaucoma: Secondary | ICD-10-CM | POA: Diagnosis not present

## 2014-10-27 DIAGNOSIS — H5440 Blindness, one eye, unspecified eye: Secondary | ICD-10-CM

## 2014-10-27 DIAGNOSIS — I481 Persistent atrial fibrillation: Secondary | ICD-10-CM

## 2014-10-27 DIAGNOSIS — I63412 Cerebral infarction due to embolism of left middle cerebral artery: Secondary | ICD-10-CM | POA: Diagnosis not present

## 2014-10-27 DIAGNOSIS — H5442 Blindness, left eye, normal vision right eye: Secondary | ICD-10-CM | POA: Diagnosis not present

## 2014-10-27 DIAGNOSIS — I4819 Other persistent atrial fibrillation: Secondary | ICD-10-CM

## 2014-10-27 DIAGNOSIS — E669 Obesity, unspecified: Secondary | ICD-10-CM

## 2014-10-27 NOTE — Progress Notes (Signed)
Subjective:    Patient ID: Bethany [name] is a 68 y.o. female.  HPI     Interim history:    Bethany [name] is a very pleasant 69 year old right-handed woman with an underlying medical history of COPD, A. fib on chronic warfarin therapy, diabetes, anemia, insomnia, legal blindness, mild diastolic heart failure, kidney stones, hypertension, status post tibial fracture in 2010, status post glaucoma surgery in the right, status post bilateral tubal ligation, who presents for follow-up consultation of her embolic strokes in the context of subtherapeutic INR due to Coumadin stop in the past. The patient is accompanied by her daughter again today. I last saw her on 02/02/2014, at which time she reported no new symptoms. She had been seeing her primary care physician regularly. Her latest INR was 2.4. She was off of aspirin. She reported that she was not able to sleep well in the sleep lab because she typically does not sleep well outside her home. In addition she had to go to the bathroom multiple times, d/t diarrhea that day and she had eaten something that may have caused the diarrhea. We talked about stroke secondary prevention. Her exam was stable for me.  Today, 10/27/14: She reports doing okay. She was in the hospital at [facility] last month for COPD exacerbation. Neurologically, she feels stable. Her breathing has improved. She now has a nebulizer at home. She has an inhaler. She had a change in her blood pressure medication and is now on a clonidine patch per primary care physician. Her blood sugar values are stable according to her daughter. She tries to drink enough water. She tries to walk inside the house. Her daughter reports no new concerns. She has not fallen.  Previously:   I first met her on 12/08/2013 at the request of her primary care physician, at which time the patient reported a stroke in October. She had regained most of her strength in her speech. She was doing well at the time. She had  had workup and we talked about workup and secondary stroke prevention. I requested a second opinion with our stroke specialist Dr. Erlinda Hong, whom she saw on 12/28/2013. I appreciate his input. He suggested she continue with Coumadin. I requested that she return for sleep study for evaluation of underlying obstructive sleep apnea. She had a sleep study on 01/14/2014 and underwent over her test results with her in detail today. Her sleep efficiency was 63.2% with a latency to sleep of 0 minutes and wake after sleep onset high at 186 minutes with moderate sleep fragmentation noted. She had for longer periods of wakefulness. Arousal index was normal. She had a markedly increased percentage of stage II sleep and a decreased percentage of slow-wave sleep and absence of REM sleep. She had evidence of A. fib on EKG. She had no significant periodic leg movements. She had moderate to loud snoring. She did not sleep much on her back. She had a total AHI of 4.7 per hour. This line oxygen saturation was 91%. Oxygen nadir was 86%.   She had presented to [facility] on 12/01/2013 with new onset altered mental status which per daughter was quite acute. She was fine the day prior and had stopped her coumadin, per instructions in preparation for an elective hernia surgery that day. She lives alone. She had no dysarthria or focal weakness at the time. I reviewed hospital records from her had [facility] in [place], [place]. Blood work included a CMP and TSH, CBC with  differential, all of which were unremarkable with the exception of a glucose level of 175, urinalysis was negative. Of note, INR was subtherapeutic at 1.1. An echocardiogram showed an EF of 65%, LVH, otherwise unremarkable per your progress report on 12/02/2013. She was noted to have expressive aphasia she was diagnosed with left MCA stroke, embolic. An EKG showed A. fib. Carotid Doppler studies on 12/02/2013 showed mild carotid bulb and proximal  ICA plaque, right greater than left, resulting in less than 50% diameter stenosis. MRI brain without contrast on 12/02/2013 showed patchy acute left MCA infarcts, confluent at the posterior insula and left superior temporal gyrus, no mass effect or hemorrhage, mild underlying chronic small vessel ischemia. X-ray left toes on 12/01/2013 showed showed slightly displaced and angulated fracture of the distal metaphysis of the proximal phalanx of the left fourth digit. CT head without contrast on 12/01/2013 showed no acute abnormality. Portable x-ray chest on 12/01/2013 showed mild cardiomegaly.   She was bridged with Lovenox, which she is still getting. Her latest INR was 1.8, she is on 10 mg of coumadin. Her daughter has been staying with her. She was also started on a baby ASA in the hospital. She does not drive. She does not drink alcohol and per daughter was a heavy drinker in the past and quit in 96. She stopped smoking in 2003. She had 3 children, only one alive.   She was discharged to home with home health occupational, physical, speech therapy and nursing.   She snores. She has some tiredness during the day. She has nocturia 4-5 times per night. She does take a diuretic twice daily, with the later dose at 4 PM. She has done well since her hospital discharge. She is able to dress herself. She is supposed to get her INR checked today. At baseline, she was taking Coumadin 7.5 mg on 2 days of the week and 10 mg the other days. She had her INR checked typically once a month. In the past she was able to successfully stop the Coumadin for her eye surgery without any issues at the time. She denies any headaches.    Her Past Medical History Is Significant For: Past Medical History  Diagnosis Date  . COPD (chronic obstructive pulmonary disease)   . Hypertension   . Diabetes mellitus without complication   . Atrial fibrillation   . Anemia   . Insomnia   . Legally blind in left eye, as defined in [place]   .  Hypercholesteremia   . Enlarged heart   . Stroke   . DVT (deep venous thrombosis)     Her Past Surgical History Is Significant For: Past Surgical History  Procedure Laterality Date  . Bilareral tubal ligation    . Removal of sweat glands      per pt. 30 years ago  . Glaucoma surgery Right   . Tibia fracture surgery Right 2010  . Fracture surgery Right 2005    Fx  leg  . Eye surgery      Her Family History Is Significant For: Family History  Problem Relation Age of Onset  . Heart disease Mother   . Hypertension Mother   . Heart attack Mother   . Hypertension Father   . Diabetes Daughter     Her Social History Is Significant For: Social History   Social History  . Marital Status: Divorced    Spouse Name: N/A  . Number of Children: 3  . Years of Education: 57 th  Social History Main Topics  . Smoking status: Former Smoker    Quit date: 02/26/2001  . Smokeless tobacco: Never Used  . Alcohol Use: No  . Drug Use: No  . Sexual Activity: Not Asked   Other Topics Concern  . None   Social History Narrative   Caffeine 1 cup daily.          Divorced. Home alone.  3 kids, (2 deceased).  11th grade.    Her Allergies Are:  Allergies  Allergen Reactions  . Sulfa Antibiotics     Swelling, hives  . Tape     Skin irritation and breakdown  :   Her Current Medications Are:  Outpatient Encounter Prescriptions as of 10/27/2014  Medication Sig  . albuterol (PROVENTIL HFA;VENTOLIN HFA) 108 (90 BASE) MCG/ACT inhaler Inhale 2 puffs into the lungs every 6 (six) hours as needed for wheezing or shortness of breath.  . albuterol (PROVENTIL HFA;VENTOLIN HFA) 108 (90 BASE) MCG/ACT inhaler 2 puffs.  . atorvastatin (LIPITOR) 10 MG tablet Take 10 mg by mouth daily.  . b complex vitamins capsule Take 1 capsule by mouth daily.  . Blood Glucose Monitoring Suppl (FORA V30A BLOOD GLUCOSE SYSTEM) DEVI by Does not apply route.  . cloNIDine (CATAPRES) 0.2 MG tablet Take 0.1 mg by mouth  2 (two) times daily. As needed for systolic BP > 170  . diltiazem (CARDIZEM CD) 240 MG 24 hr capsule Take 240 mg by mouth daily.  . Dorzolamide HCl-Timolol Mal PF 22.3-6.8 MG/ML SOLN Apply to eye.  . furosemide (LASIX) 40 MG tablet Take 40 mg by mouth. 1 tab am, 1/2 tab in pm  . hydrALAZINE (APRESOLINE) 25 MG tablet Take 50 mg by mouth 2 (two) times daily.   . Iron, Ferrous Gluconate, 256 (28 FE) MG TABS Take 324 mg by mouth daily.   . metFORMIN (GLUCOPHAGE) 500 MG tablet Take 500 mg by mouth. One tablet twice daily  . pantoprazole (PROTONIX) 40 MG tablet Take 40 mg by mouth daily.  . potassium chloride (K-DUR) 10 MEQ tablet Take 20 mEq by mouth daily. 1 1/2 tablet daily,Take with lasix  . valsartan (DIOVAN) 160 MG tablet Take 160 mg by mouth daily.   . warfarin (COUMADIN) 5 MG tablet Take 7.5 mg by mouth daily. Takes 2 tabs Mon, Wed, Fri. Takes 7.5mg Tues, Thur, Sat, Sun.  . [DISCONTINUED] budesonide-formoterol (SYMBICORT) 160-4.5 MCG/ACT inhaler Inhale 2 puffs into the lungs 2 (two) times daily.  . [DISCONTINUED] esomeprazole (NEXIUM) 40 MG capsule Take 40 mg by mouth daily at 12 noon.  . [DISCONTINUED] labetalol (NORMODYNE) 200 MG tablet Take 200 mg by mouth 2 (two) times daily. 2 Tabs. By mouth BID  . [DISCONTINUED] Magnesium 250 MG TABS Take 250 mg by mouth daily.  . [DISCONTINUED] tamsulosin (FLOMAX) 0.4 MG CAPS capsule Take 0.4 mg by mouth daily after breakfast.  . [DISCONTINUED] traMADol (ULTRAM) 50 MG tablet Take 50 mg by mouth every 6 (six) hours as needed.   No facility-administered encounter medications on file as of 10/27/2014.  :  Review of Systems:  Out of a complete 14 point review of systems, all are reviewed and negative with the exception of these symptoms as listed below:   Review of Systems  All other systems reviewed and are negative.   Objective:  Neurologic Exam  Physical Exam Physical Examination:   Filed Vitals:   10/27/14 1207  BP: 142/60  Pulse: 58   Resp: 18    General Examination: The patient   is a very pleasant 69 y.o. female in no acute distress. She appears well-developed and well-nourished and adequately groomed. She is obese.  HEENT: Normocephalic, atraumatic, pupils are equal, round and reactive to light and accommodation but she has limited vision on the left, with exotropia noted on the left. Extraocular tracking is fairly good. Hearing is grossly intact. Face is symmetric with normal facial animation and normal facial sensation. Speech is clear with no dysarthria noted. There is no hypophonia. There is no lip, neck/head, jaw or voice tremor. Neck is supple with full range of passive and active motion. There are no carotid bruits on auscultation. Oropharynx exam reveals: moderate mouth dryness, poor dental hygiene and several missing teeth. There is moderate airway crowding, due to large tongue, and redundant soft palate with longer uvula noted. Tonsils are small. Mallampati is class III. Tongue protrudes centrally and palate elevates symmetrically.    Chest: Clear to auscultation without wheezing, rhonchi or crackles noted.  Heart:  Irregularly irregular.   Abdomen: Soft, non-tender and non-distended with normal bowel sounds appreciated on auscultation.  Extremities: There is 1 to 2+ pitting edema in the distal right leg, trace to 1+ on the left. She has a scar and discoloration on the right secondary to prior leg injury and surgeryo it.   Skin: Warm and dry without trophic changes noted.  Musculoskeletal: exam reveals no obvious joint deformities, tenderness or joint swelling or erythema.   Neurologically:  Mental status: The patient is awake, alert and oriented in all 4 spheres. Her immediate and remote memory, attention, language skills and fund of knowledge are fairly well preserved. She has very minimal dysarthria. She has improved comprehension and with following commands. She has very mild difficulty with expressive speech.  Thought process is linear. Mood is normal and affect is normal.  Cranial nerves II - XII are as described above under HEENT exam. In addition: shoulder shrug is normal with equal shoulder height noted. Motor exam: Normal bulk, strength and tone is noted. There is no drift, tremor or rebound. Reflexes are 2+ throughout. Babinski: Toes are flexor bilaterally. Fine motor skills and coordination: intact with normal finger taps, normal hand movements, normal rapid alternating patting, normal foot taps and normal foot agility.  Cerebellar testing: No dysmetria or intention tremor on finger to nose testing. Heel to shin is difficult for her.   Sensory exam: intact to light touch in the upper and lower extremities.  Gait, station and balance: She stands with mild difficulty. No veering to one side is noted. No leaning to one side is noted. Posture is age-appropriate and stance is  slightly wide-based. She walks cautiously and slowly. She has a slight limp. Tandem walk is not possible for her.  Assessment and Plan:  In summary, Bethany [name] is a very pleasant 69-year old female with an underlying complex medical history of COPD, A. fib on chronic warfarin therapy, diabetes, anemia, insomnia, legal blindness, mild diastolic heart failure, kidney stones, hypertension, status post tibia fracture in 2010, status post glaucoma surgery in the right, status post bilateral tubal ligation, who  presents for follow-up consultation of her embolic left MCA stroke in the context of stopping Coumadin for elective surgery. She had presented to [facility] on 12/01/2013 with new onset altered mental status and garbled speech and was found to have embolic left MCA strokes in the context of subtherapeutic INR. She had stopped the Coumadin in preparation for hernia surgery that day.  Her exam is stable   to slightly improved.  she had stroke workup and we did a sleep study. We requested a stroke second opinion with Dr. Erlinda Hong  and she saw him in November 2015. I appreciate his input. She has been back on Coumadin and is off of aspirin. She has had no new TIA or strokelike symptoms. She has finished therapy. She has been seeing her primary care physician on a regular basis. We talked about secondary stroke prevention again today. This includes diabetes management, cholesterol management, blood pressure management, weight management, taking medications on time, exercising regularly. She has had an appropriate stroke workup done and had a second opinion with Dr. Erlinda Hong, which I appreciate.  At this juncture I suggested as needed follow-up with me. I answered all her questions today and the patient and her daughter were in agreement. I provided her with written instructions regarding her goals for treatment and secondary prevention:  - continue coumadin for stroke prevention, check INR as scheduled and goal is 2-3, follow up with primary care for this.  - continue lipitor for stroke prevention and LDL cholesterol goal below 70 mg/dL, regular check up per primary care for this.  - Follow up with your primary care physician for stroke risk factor modification. We usually recommend maintain blood pressure goal <130/80, diabetes with hemoglobin A1c goal below 6.5%. - continue to follow up with eye doctor for glaucoma and DM retinopathy.  - follow up with me as needed.  I spent 15 minutes in total face-to-face time with the patient, more than 50% of which was spent in counseling and coordination of care, reviewing test results, reviewing medication and discussing or reviewing the diagnosis of stroke, its prognosis and treatment options.

## 2014-10-27 NOTE — Patient Instructions (Addendum)
Continue exercising regularly and take your medications as directed. As discussed, secondary prevention is key after a stroke. This means: taking care of blood sugar values or diabetes management, good blood pressure (hypertension) control and optimizing cholesterol management, exercising daily or regularly within your own mobility limitations of course, and overall cardiovascular risk factor reduction, which includes screening for and treatment of obstructive sleep apnea (OSA), which we did, and weight management.   - continue coumadin for stroke prevention, check INR as scheduled and goal is 2-3, follow up with primary care for this.  - continue lipitor for stroke prevention and LDL cholesterol goal below 70 mg/dL, regular check up per primary care for this.  - Follow up with your primary care physician for stroke risk factor modification. We usually recommend maintain blood pressure goal <130/80, diabetes with hemoglobin A1c goal below 6.5%. - continue to follow up with eye doctor for glaucoma and DM retinopathy.  - follow up with me as needed.

## 2014-11-03 ENCOUNTER — Encounter: Payer: Self-pay | Admitting: Family

## 2014-11-04 ENCOUNTER — Ambulatory Visit (INDEPENDENT_AMBULATORY_CARE_PROVIDER_SITE_OTHER): Payer: Medicare Other | Admitting: Family

## 2014-11-04 ENCOUNTER — Ambulatory Visit (HOSPITAL_COMMUNITY)
Admission: RE | Admit: 2014-11-04 | Discharge: 2014-11-04 | Disposition: A | Discharge status: Home / Self Care | Payer: Medicare Other | Source: Ambulatory Visit | Attending: Family | Admitting: Family

## 2014-11-04 ENCOUNTER — Encounter: Payer: Self-pay | Admitting: Family

## 2014-11-04 ENCOUNTER — Other Ambulatory Visit: Payer: Self-pay | Admitting: Family

## 2014-11-04 VITALS — BP 160/65 | HR 43 | Ht 68.0 in | Wt 228.0 lb

## 2014-11-04 DIAGNOSIS — Z7901 Long term (current) use of anticoagulants: Secondary | ICD-10-CM | POA: Diagnosis not present

## 2014-11-04 DIAGNOSIS — M7989 Other specified soft tissue disorders: Secondary | ICD-10-CM

## 2014-11-04 DIAGNOSIS — I482 Chronic atrial fibrillation, unspecified: Secondary | ICD-10-CM

## 2014-11-04 DIAGNOSIS — I739 Peripheral vascular disease, unspecified: Secondary | ICD-10-CM | POA: Diagnosis not present

## 2014-11-04 DIAGNOSIS — E1159 Type 2 diabetes mellitus with other circulatory complications: Secondary | ICD-10-CM | POA: Diagnosis not present

## 2014-11-04 DIAGNOSIS — Z87891 Personal history of nicotine dependence: Secondary | ICD-10-CM

## 2014-11-04 DIAGNOSIS — E1151 Type 2 diabetes mellitus with diabetic peripheral angiopathy without gangrene: Secondary | ICD-10-CM

## 2014-11-04 DIAGNOSIS — I872 Venous insufficiency (chronic) (peripheral): Secondary | ICD-10-CM

## 2014-11-04 NOTE — Progress Notes (Signed)
[facility] [place] HISTORY AND PHYSICAL -PAD  History of Present Illness Bethany [name] is a 69 y.o. female patient of Dr. Oneida Alar who presents for evaluation of peripheral arterial disease. She was noted to have an abnormal non invasive arterial study on screening July 2015. She is asymptomatic. She denies claudication, rest pain, or non healing wounds.   Pt states her blood pressure at home this AM was 123456 systolic, states she took all of her morning medications.  She does not walk much to elicit claudication, is in her wheelchair. Pt states she is using her knee high compression hose daily with good results.   She was hospitalized at [facility] in July 2016 for exacerbation of COPD.  Pt Diabetic: Yes, no labresults on file since 2012, pt states FBS is about 100 Pt smoker: former smoker, quit in 2003  Pt meds include: Statin :Yes ASA: No Other anticoagulants/antiplatelets: coumadin for atrial fib     Past Medical History  Diagnosis Date  . COPD (chronic obstructive pulmonary disease)   . Hypertension   . Diabetes mellitus without complication   . Atrial fibrillation   . Anemia   . Insomnia   . Legally blind in left eye, as defined in [place]   . Hypercholesteremia   . Enlarged heart   . Stroke   . DVT (deep venous thrombosis)     Social History Social History  Substance Use Topics  . Smoking status: Former Smoker    Quit date: 02/26/2001  . Smokeless tobacco: Never Used  . Alcohol Use: No    Family History Family History  Problem Relation Age of Onset  . Heart disease Mother   . Hypertension Mother   . Heart attack Mother   . Hypertension Father   . Diabetes Daughter     Past Surgical History  Procedure Laterality Date  . Bilareral tubal ligation    . Removal of sweat glands      per pt. 30 years ago  . Glaucoma surgery Right   . Tibia fracture surgery Right 2010  . Fracture surgery Right 2005    Fx  leg  . Eye surgery       Allergies  Allergen Reactions  . Sulfa Antibiotics     Swelling, hives  . Tape     Skin irritation and breakdown    Current Outpatient Prescriptions  Medication Sig Dispense Refill  . albuterol (PROVENTIL HFA;VENTOLIN HFA) 108 (90 BASE) MCG/ACT inhaler Inhale 2 puffs into the lungs every 6 (six) hours as needed for wheezing or shortness of breath.    Marland Kitchen albuterol (PROVENTIL HFA;VENTOLIN HFA) 108 (90 BASE) MCG/ACT inhaler 2 puffs.    Marland Kitchen atorvastatin (LIPITOR) 10 MG tablet Take 10 mg by mouth daily.    Marland Kitchen b complex vitamins capsule Take 1 capsule by mouth daily.    . Blood Glucose Monitoring Suppl (FORA V30A BLOOD GLUCOSE SYSTEM) DEVI by Does not apply route.    . cloNIDine (CATAPRES) 0.2 MG tablet Take 0.1 mg by mouth 2 (two) times daily. As needed for systolic BP > 123XX123    . diltiazem (CARDIZEM CD) 240 MG 24 hr capsule Take 240 mg by mouth daily.    . Dorzolamide HCl-Timolol Mal PF 22.3-6.8 MG/ML SOLN Apply to eye.    . furosemide (LASIX) 40 MG tablet Take 40 mg by mouth. 1 tab am, 1/2 tab in pm    . hydrALAZINE (APRESOLINE) 25 MG tablet Take 50 mg by mouth 2 (  two) times daily.     . Iron, Ferrous Gluconate, 256 (28 FE) MG TABS Take 324 mg by mouth daily.     . metFORMIN (GLUCOPHAGE) 500 MG tablet Take 500 mg by mouth. One tablet twice daily    . pantoprazole (PROTONIX) 40 MG tablet Take 40 mg by mouth daily.    . potassium chloride (K-DUR) 10 MEQ tablet Take 20 mEq by mouth daily. 1 1/2 tablet daily,Take with lasix    . valsartan (DIOVAN) 160 MG tablet Take 160 mg by mouth daily.     Marland Kitchen warfarin (COUMADIN) 5 MG tablet Take 7.5 mg by mouth daily. Takes 2 tabs Mon, Wed, Fri. Takes 7.5mg  Harrell Lark, Sat, Sun.     No current facility-administered medications for this visit.    ROS: See HPI for pertinent positives and negatives.   Physical Examination  Filed Vitals:   11/04/14 1020 11/04/14 1028  BP: 168/73 160/65  Pulse: 43   Height: 5\' 8"  (1.727 m)   Weight: 228 lb (103.42 kg)    SpO2: 96%    Body mass index is 34.68 kg/(m^2).  General: A&O x 3, WDWN, obese female. Gait: is seated in her wheelchair Eyes: PERRLA. Pulmonary: CTAB, without wheezes or rhonchi + rales in right base. Cardiac: Irregular Rythm.     Carotid Bruits Right Left   Negative Negative  Aorta is not palpable. Radial pulses: are 2+ palpable   VASCULAR EXAM: Extremities:  without ischemic changes, without Gangrene; without open wounds. Right lower leg with 2+ pitting and nonpitting edema, hemosiderin deposits indicating chronic venous insufficiency. Left lower leg with trace pitting edema.     LE Pulses Right Left   FEMORAL not palpable not palpable    POPLITEAL not palpable  not palpable   POSTERIOR TIBIAL not palpable  not palpable    DORSALIS PEDIS  ANTERIOR TIBIAL not palpable  not palpable    Abdomen: soft, NT, no palpable masses. Skin: no rashes, no ulcers, see extremities. Musculoskeletal: no muscle wasting or atrophy. Neurologic: A&O X 2; Appropriate Affect, MOTOR FUNCTION: moving all extremities equally, motor strength 4/5 throughout. Speech is sparse but clear. CN 2-12 intact.          Non-Invasive Vascular Imaging: DATE: 11/04/2014 ABI (Date: 11/04/2014)  R: 0.87 (0.97, 04/29/14), DP: triphasic, PT: triphasic, TBI: 1.02 (0.66)  L: 0.49 (0.68), DP: biphasic, PT: biphasic, TBI: 0.45 (0.47)  ASSESSMENT: Bethany [name] is a 69 y.o. female who presents for evaluation of peripheral arterial disease. She was noted to have an abnormal non invasive arterial study on screening July 2015. She is asymptomatic. She denies claudication, rest pain, she has no signs of ischemia in her lower extremites. She walks a little in her house, but otherwise gets  around mostly in her wheelchair. Today's ABI's indicates mild arterial occlusive diease in the right LE and mild in the left, both have worsened slightly in six months. Right LE with triphasic waveforms, left LE with biphasic. Pt's atherosclerotic risk factors include DM, former smoker, and atrial fibrillation for which she takes coumadin.   Her chronic venous insufficiency has been improved with her use of graduated knee high compression hose. She has no venous stasis ulcers.    Pt was advised to see her PCP ASAP re her elevated blood pressure.    PLAN:  Daily seated leg exercises discussed and demonstrated.   Based on the patient's vascular studies and examination, pt will return to clinic in 6 months with ABI's. She knows  to return sooner for problems or concerns re her feet and legs.  I discussed in depth with the patient the nature of atherosclerosis, and emphasized the importance of maximal medical management including strict control of blood pressure, blood glucose, and lipid levels, obtaining regular exercise, and continued cessation of smoking.  The patient is aware that without maximal medical management the underlying atherosclerotic disease process will progress, limiting the benefit of any interventions.  The patient was given information about PAD including signs, symptoms, treatment, what symptoms should prompt the patient to seek immediate medical care, and risk reduction measures to take.  Clemon Chambers, RN, MSN, FNP-C [facility] Phone: [phone]  Clinic MD: [facility]  11/04/2014 10:39 AM

## 2014-11-04 NOTE — Patient Instructions (Signed)
Peripheral Vascular Disease Peripheral Vascular Disease (PVD), also called Peripheral Arterial Disease (PAD), is a circulation problem caused by cholesterol (atherosclerotic plaque) deposits in the arteries. PVD commonly occurs in the lower extremities (legs) but it can occur in other areas of the body, such as your arms. The cholesterol buildup in the arteries reduces blood flow which can cause pain and other serious problems. The presence of PVD can place a person at risk for Coronary Artery Disease (CAD).  CAUSES  Causes of PVD can be many. It is usually associated with more than one risk factor such as:   High Cholesterol.  Smoking.  Diabetes.  Lack of exercise or inactivity.  High blood pressure (hypertension).  Obesity.  Family history. SYMPTOMS   When the lower extremities are affected, patients with PVD may experience:  Leg pain with exertion or physical activity. This is called INTERMITTENT CLAUDICATION. This may present as cramping or numbness with physical activity. The location of the pain is associated with the level of blockage. For example, blockage at the abdominal level (distal abdominal aorta) may result in buttock or hip pain. Lower leg arterial blockage may result in calf pain.  As PVD becomes more severe, pain can develop with less physical activity.  In people with severe PVD, leg pain may occur at rest.  Other PVD signs and symptoms:  Leg numbness or weakness.  Coldness in the affected leg or foot, especially when compared to the other leg.  A change in leg color.  Patients with significant PVD are more prone to ulcers or sores on toes, feet or legs. These may take longer to heal or may reoccur. The ulcers or sores can become infected.  If signs and symptoms of PVD are ignored, gangrene may occur. This can result in the loss of toes or loss of an entire limb.  Not all leg pain is related to PVD. Other medical conditions can cause leg pain such  as:  Blood clots (embolism) or Deep Vein Thrombosis.  Inflammation of the blood vessels (vasculitis).  Spinal stenosis. DIAGNOSIS  Diagnosis of PVD can involve several different types of tests. These can include:  Pulse Volume Recording Method (PVR). This test is simple, painless and does not involve the use of X-rays. PVR involves measuring and comparing the blood pressure in the arms and legs. An ABI (Ankle-Brachial Index) is calculated. The normal ratio of blood pressures is 1. As this number becomes smaller, it indicates more severe disease.  < 0.95 - indicates significant narrowing in one or more leg vessels.  <0.8 - there will usually be pain in the foot, leg or buttock with exercise.  <0.4 - will usually have pain in the legs at rest.  <0.25 - usually indicates limb threatening PVD.  Doppler detection of pulses in the legs. This test is painless and checks to see if you have a pulses in your legs/feet.  A dye or contrast material (a substance that highlights the blood vessels so they show up on x-ray) may be given to help your caregiver better see the arteries for the following tests. The dye is eliminated from your body by the kidney's. Your caregiver may order blood work to check your kidney function and other laboratory values before the following tests are performed:  Magnetic Resonance Angiography (MRA). An MRA is a picture study of the blood vessels and arteries. The MRA machine uses a large magnet to produce images of the blood vessels.  Computed Tomography Angiography (CTA). A CTA   is a specialized x-ray that looks at how the blood flows in your blood vessels. An IV may be inserted into your arm so contrast dye can be injected.  Angiogram. Is a procedure that uses x-rays to look at your blood vessels. This procedure is minimally invasive, meaning a small incision (cut) is made in your groin. A small tube (catheter) is then inserted into the artery of your groin. The catheter  is guided to the blood vessel or artery your caregiver wants to examine. Contrast dye is injected into the catheter. X-rays are then taken of the blood vessel or artery. After the images are obtained, the catheter is taken out. TREATMENT  Treatment of PVD involves many interventions which may include:  Lifestyle changes:  Quitting smoking.  Exercise.  Following a low fat, low cholesterol diet.  Control of diabetes.  Foot care is very important to the PVD patient. Good foot care can help prevent infection.  Medication:  Cholesterol-lowering medicine.  Blood pressure medicine.  Anti-platelet drugs.  Certain medicines may reduce symptoms of Intermittent Claudication.  Interventional/Surgical options:  Angioplasty. An Angioplasty is a procedure that inflates a balloon in the blocked artery. This opens the blocked artery to improve blood flow.  Stent Implant. A wire mesh tube (stent) is placed in the artery. The stent expands and stays in place, allowing the artery to remain open.  Peripheral Bypass Surgery. This is a surgical procedure that reroutes the blood around a blocked artery to help improve blood flow. This type of procedure may be performed if Angioplasty or stent implants are not an option. SEEK IMMEDIATE MEDICAL CARE IF:   You develop pain or numbness in your arms or legs.  Your arm or leg turns cold, becomes blue in color.  You develop redness, warmth, swelling and pain in your arms or legs. MAKE SURE YOU:   Understand these instructions.  Will watch your condition.  Will get help right away if you are not doing well or get worse. Document Released: 03/22/2004 Document Revised: 05/07/2011 Document Reviewed: 02/17/2008 ExitCare Patient Information 2015 [employer]. This information is not intended to replace advice given to you by your health care Trini Christiansen. Make sure you discuss any questions you have with your health care Michiel Sivley.   Venous Stasis or  Chronic Venous Insufficiency Chronic venous insufficiency, also called venous stasis, is a condition that affects the veins in the legs. The condition prevents blood from being pumped through these veins effectively. Blood may no longer be pumped effectively from the legs back to the heart. This condition can range from mild to severe. With proper treatment, you should be able to continue with an active life. CAUSES  Chronic venous insufficiency occurs when the vein walls become stretched, weakened, or damaged or when valves within the vein are damaged. Some common causes of this include:  High blood pressure inside the veins (venous hypertension).  Increased blood pressure in the leg veins from long periods of sitting or standing.  A blood clot that blocks blood flow in a vein (deep vein thrombosis).  Inflammation of a superficial vein (phlebitis) that causes a blood clot to form. RISK FACTORS Various things can make you more likely to develop chronic venous insufficiency, including:  Family history of this condition.  Obesity.  Pregnancy.  Sedentary lifestyle.  Smoking.  Jobs requiring long periods of standing or sitting in one place.  Being a certain age. Women in their 40s and 50s and men in their 70s are more   likely to develop this condition. SIGNS AND SYMPTOMS  Symptoms may include:   Varicose veins.  Skin breakdown or ulcers.  Reddened or discolored skin on the leg.  Brown, smooth, tight, and painful skin just above the ankle, usually on the inside surface (lipodermatosclerosis).  Swelling. DIAGNOSIS  To diagnose this condition, your health care Rufus Beske will take a medical history and do a physical exam. The following tests may be ordered to confirm the diagnosis:  Duplex ultrasound--A procedure that produces a picture of a blood vessel and nearby organs and also provides information on blood flow through the blood vessel.  Plethysmography--A procedure that tests  blood flow.  A venogram, or venography--A procedure used to look at the veins using X-ray and dye. TREATMENT The goals of treatment are to help you return to an active life and to minimize pain or disability. Treatment will depend on the severity of the condition. Medical procedures may be needed for severe cases. Treatment options may include:   Use of compression stockings. These can help with symptoms and lower the chances of the problem getting worse, but they do not cure the problem.  Sclerotherapy--A procedure involving an injection of a material that "dissolves" the damaged veins. Other veins in the network of blood vessels take over the function of the damaged veins.  Surgery to remove the vein or cut off blood flow through the vein (vein stripping or laser ablation surgery).  Surgery to repair a valve. HOME CARE INSTRUCTIONS   Wear compression stockings as directed by your health care Jahmiyah Dullea.  Only take over-the-counter or prescription medicines for pain, discomfort, or fever as directed by your health care Mildred Bollard.  Follow up with your health care Marji Kuehnel as directed. SEEK MEDICAL CARE IF:   You have redness, swelling, or increasing pain in the affected area.  You see a red streak or line that extends up or down from the affected area.  You have a breakdown or loss of skin in the affected area, even if the breakdown is small.  You have an injury to the affected area. SEEK IMMEDIATE MEDICAL CARE IF:   You have an injury and open wound in the affected area.  Your pain is severe and does not improve with medicine.  You have sudden numbness or weakness in the foot or ankle below the affected area, or you have trouble moving your foot or ankle.  You have a fever or persistent symptoms for more than 2-3 days.  You have a fever and your symptoms suddenly get worse. MAKE SURE YOU:   Understand these instructions.  Will watch your condition.  Will get help right  away if you are not doing well or get worse. Document Released: 06/18/2006 Document Revised: 12/03/2012 Document Reviewed: 10/20/2012 ExitCare Patient Information 2015 [employer]. This information is not intended to replace advice given to you by your health care Tenzin Pavon. Make sure you discuss any questions you have with your health care Breyanna Valera.  

## 2015-05-09 ENCOUNTER — Encounter: Payer: Self-pay | Admitting: Family

## 2015-05-10 ENCOUNTER — Other Ambulatory Visit: Payer: Self-pay | Admitting: *Deleted

## 2015-05-10 DIAGNOSIS — I739 Peripheral vascular disease, unspecified: Secondary | ICD-10-CM

## 2015-05-12 ENCOUNTER — Encounter (HOSPITAL_COMMUNITY): Payer: Medicare Other

## 2015-05-12 ENCOUNTER — Ambulatory Visit: Payer: Medicare Other | Admitting: Family

## 2015-09-06 ENCOUNTER — Encounter: Payer: Self-pay | Admitting: Family

## 2015-09-09 ENCOUNTER — Ambulatory Visit (HOSPITAL_COMMUNITY)
Admission: RE | Admit: 2015-09-09 | Discharge: 2015-09-09 | Disposition: A | Discharge status: Home / Self Care | Payer: Medicare Other | Source: Ambulatory Visit | Attending: Vascular Surgery | Admitting: Vascular Surgery

## 2015-09-09 ENCOUNTER — Ambulatory Visit (INDEPENDENT_AMBULATORY_CARE_PROVIDER_SITE_OTHER): Payer: Medicare Other | Admitting: Family

## 2015-09-09 ENCOUNTER — Encounter: Payer: Self-pay | Admitting: Family

## 2015-09-09 VITALS — BP 169/74 | HR 56 | Temp 97.8°F | Resp 16 | Ht 68.0 in | Wt 227.0 lb

## 2015-09-09 DIAGNOSIS — I872 Venous insufficiency (chronic) (peripheral): Secondary | ICD-10-CM

## 2015-09-09 DIAGNOSIS — I10 Essential (primary) hypertension: Secondary | ICD-10-CM | POA: Insufficient documentation

## 2015-09-09 DIAGNOSIS — E1151 Type 2 diabetes mellitus with diabetic peripheral angiopathy without gangrene: Secondary | ICD-10-CM | POA: Insufficient documentation

## 2015-09-09 DIAGNOSIS — Z8673 Personal history of transient ischemic attack (TIA), and cerebral infarction without residual deficits: Secondary | ICD-10-CM

## 2015-09-09 DIAGNOSIS — Z87891 Personal history of nicotine dependence: Secondary | ICD-10-CM | POA: Diagnosis not present

## 2015-09-09 DIAGNOSIS — I739 Peripheral vascular disease, unspecified: Secondary | ICD-10-CM | POA: Diagnosis not present

## 2015-09-09 DIAGNOSIS — I482 Chronic atrial fibrillation, unspecified: Secondary | ICD-10-CM

## 2015-09-09 DIAGNOSIS — Z7901 Long term (current) use of anticoagulants: Secondary | ICD-10-CM

## 2015-09-09 NOTE — Progress Notes (Signed)
[facility] [place]   CC: Follow up peripheral artery occlusive disease  History of Present Illness Bethany [name] is a 70 y.o. female patient of Dr. Oneida Alar who presents for evaluation of peripheral arterial disease. She was noted to have an abnormal non invasive arterial study on screening July 2015. She is asymptomatic. She denies claudication, rest pain, or non healing wounds.   Pt states her blood pressure at home is [id number] systolic, states she took all of her morning medications.  She was walking 15 minutes daily and 10 minutes on stationary bike at a gym, 5 days/week, until it got so hot that she could not walk to her car without her COPD exacerbating. She walks in place in her house to exercise. She is intentionally eating healthy and losing weight. Pt states she is using her knee high compression hose daily with good results.   She was hospitalized at [facility] in July 2016 for exacerbation of COPD.  Pt Diabetic: Yes, no labresults on file since 2012, pt states FBS is about 100, states her last A1C was 6.7 Pt smoker: former smoker, quit in 2003  Pt meds include: Statin :Yes ASA: No Other anticoagulants/antiplatelets: coumadin for atrial fib      Past Medical History  Diagnosis Date  . COPD (chronic obstructive pulmonary disease) ([place])   . Hypertension   . Diabetes mellitus without complication ([place])   . Atrial fibrillation ([place])   . Anemia   . Insomnia   . Legally blind in left eye, as defined in [place]   . Hypercholesteremia   . Enlarged heart   . Stroke ([place])   . DVT (deep venous thrombosis) ([place])   . Gangrene [facility])     Social History Social History  Substance Use Topics  . Smoking status: Former Smoker    Quit date: 02/26/2001  . Smokeless tobacco: Never Used  . Alcohol Use: No    Family History Family History  Problem Relation Age of Onset  . Heart disease Mother   . Hypertension Mother   . Heart attack Mother   .  Hypertension Father   . Diabetes Daughter     Past Surgical History  Procedure Laterality Date  . Bilareral tubal ligation    . Removal of sweat glands      per pt. 30 years ago  . Glaucoma surgery Right   . Tibia fracture surgery Right 2010  . Fracture surgery Right 2005    Fx  leg  . Eye surgery      Allergies  Allergen Reactions  . Sulfa Antibiotics     Swelling, hives  . Sulfamethoxazole Hives  . Tape Other (See Comments)    Pulls skin off. Skin irritation and breakdown    Current Outpatient Prescriptions  Medication Sig Dispense Refill  . albuterol (PROVENTIL HFA;VENTOLIN HFA) 108 (90 BASE) MCG/ACT inhaler Inhale 2 puffs into the lungs every 6 (six) hours as needed for wheezing or shortness of breath.    Marland Kitchen albuterol (PROVENTIL HFA;VENTOLIN HFA) 108 (90 BASE) MCG/ACT inhaler 2 puffs.    Marland Kitchen atorvastatin (LIPITOR) 10 MG tablet Take 10 mg by mouth daily.    Marland Kitchen b complex vitamins capsule Take 1 capsule by mouth daily.    . Blood Glucose Monitoring Suppl (FORA V30A BLOOD GLUCOSE SYSTEM) DEVI by Does not apply route.    . diltiazem (CARDIZEM CD) 240 MG 24 hr capsule Take 240 mg by mouth daily.    . Dorzolamide HCl-Timolol Mal  PF 22.3-6.8 MG/ML SOLN Apply to eye.    . furosemide (LASIX) 40 MG tablet Take 40 mg by mouth. 1 tab am, 1/2 tab in pm    . hydrALAZINE (APRESOLINE) 25 MG tablet Take 50 mg by mouth 2 (two) times daily.     . Iron, Ferrous Gluconate, 256 (28 FE) MG TABS Take 324 mg by mouth daily.     . metFORMIN (GLUCOPHAGE) 500 MG tablet Take 500 mg by mouth. One tablet twice daily    . pantoprazole (PROTONIX) 40 MG tablet Take 40 mg by mouth daily.    . potassium chloride (K-DUR) 10 MEQ tablet Take 20 mEq by mouth daily. 1 1/2 tablet daily,Take with lasix    . valsartan (DIOVAN) 160 MG tablet Take 160 mg by mouth daily.     Marland Kitchen warfarin (COUMADIN) 5 MG tablet Take 7.5 mg by mouth daily. Takes 2 tabs Mon, Wed, Fri. Takes 7.5mg  Harrell Lark, Sat, Sun.    . cloNIDine  (CATAPRES) 0.2 MG tablet Take 0.1 mg by mouth 2 (two) times daily. Reported on 09/09/2015     No current facility-administered medications for this visit.    ROS: See HPI for pertinent positives and negatives.   Physical Examination  Filed Vitals:   09/09/15 1001 09/09/15 1005  BP: 177/73 169/74  Pulse: 58 56  Temp: 97.8 F (36.6 C)   Resp: 16   Height: 5\' 8"  (1.727 m)   Weight: 227 lb (102.967 kg)   SpO2: 99%    Body mass index is 34.52 kg/(m^2).  General: A&O x 3, WDWN, obese female. Gait: is seated in her wheelchair Eyes: PERRLA. Pulmonary: CTAB, without wheezes or rhonchi + rales in right base. Cardiac: Irregular Rythm.     Carotid Bruits Right Left   Negative Negative  Aorta is not palpable. Radial pulses: are 2+ palpable   VASCULAR EXAM: Extremities: without ischemic changes, without Gangrene; without open wounds. Right lower leg with 2+ pitting and nonpitting edema, hemosiderin deposits indicating chronic venous insufficiency. Left lower leg with trace pitting edema.     LE Pulses Right Left   FEMORAL not palpable not palpable    POPLITEAL not palpable  not palpable   POSTERIOR TIBIAL not palpable  not palpable    DORSALIS PEDIS  ANTERIOR TIBIAL not palpable  not palpable    Abdomen: soft, NT, no palpable masses. Skin: no rashes, no ulcers, see extremities. Musculoskeletal: no muscle wasting or atrophy. Neurologic: A&O X 2; Appropriate Affect, MOTOR FUNCTION: moving all extremities equally, motor strength 4/5 throughout. Speech is sparse but clear. CN 2-12 intact.                Non-Invasive Vascular Imaging: DATE: 09/09/2015  ABI:  RIGHT: 0.94 (0.87, 11/04/14), Waveforms: biphasic, TBI: 0.53, toes  pressure: 90   LEFT: 0.60 (0.49), Waveforms: monophasic, TBI: 0.42, toes pressure: 71   ASSESSMENT: Bethany [name] is a 70 y.o. female who presents for evaluation of peripheral arterial disease. She was noted to have an abnormal non invasive arterial study on screening in July 2015. She is asymptomatic. She denies claudication, denies rest pain, she has no signs of ischemia in her lower extremites. She exercises and walks a great deal daily. ABI's today have improved in bilaterally with mild arterial occlusive disease in the right and moderate in the left. Toe pressures are adequate for healing bilaterally if she sustains a minor injury to her LE's.  Her atherosclerotic risk factors include well controlled DM and former  smoker (quit in 2003). She takes coumadin for atrial fib.  I congratulated her on her excellent lifestyle habits and encouraged her to continue  PLAN:  Based on the patient's vascular studies and examination, pt will return to clinic in 1 year with ABI's.  I discussed in depth with the patient the nature of atherosclerosis, and emphasized the importance of maximal medical management including strict control of blood pressure, blood glucose, and lipid levels, obtaining regular exercise, and continued cessation of smoking.  The patient is aware that without maximal medical management the underlying atherosclerotic disease process will progress, limiting the benefit of any interventions.  The patient was given information about PAD including signs, symptoms, treatment, what symptoms should prompt the patient to seek immediate medical care, and risk reduction measures to take.  Clemon Chambers, RN, MSN, FNP-C [facility] Phone: [phone]  Clinic MD: Bridgett Larsson  09/09/2015 10:20 AM

## 2015-09-09 NOTE — Progress Notes (Signed)
Filed Vitals:   09/09/15 1001 09/09/15 1005  BP: 177/73 169/74  Pulse: 58 56  Temp: 97.8 F (36.6 C)   Resp: 16   Height: 5\' 8"  (1.727 m)   Weight: 227 lb (102.967 kg)   SpO2: 99%

## 2016-01-13 NOTE — Addendum Note (Signed)
Addended by: Lianne Cure A on: 01/13/2016 12:47 PM   Modules accepted: Orders

## 2016-09-20 ENCOUNTER — Ambulatory Visit: Payer: Medicare Other | Admitting: Family

## 2016-09-20 ENCOUNTER — Encounter (HOSPITAL_COMMUNITY): Payer: Medicare Other

## 2016-10-02 ENCOUNTER — Encounter: Payer: Self-pay | Admitting: Family

## 2016-10-11 ENCOUNTER — Ambulatory Visit (HOSPITAL_COMMUNITY)
Admission: RE | Admit: 2016-10-11 | Discharge: 2016-10-11 | Disposition: A | Discharge status: Home / Self Care | Payer: Medicare Other | Source: Ambulatory Visit | Attending: Family | Admitting: Family

## 2016-10-11 ENCOUNTER — Encounter: Payer: Self-pay | Admitting: Family

## 2016-10-11 ENCOUNTER — Ambulatory Visit (INDEPENDENT_AMBULATORY_CARE_PROVIDER_SITE_OTHER): Payer: Medicare Other | Admitting: Family

## 2016-10-11 VITALS — BP 177/74 | HR 52 | Temp 97.5°F | Resp 18 | Ht 68.0 in | Wt 229.0 lb

## 2016-10-11 DIAGNOSIS — Z8673 Personal history of transient ischemic attack (TIA), and cerebral infarction without residual deficits: Secondary | ICD-10-CM | POA: Diagnosis not present

## 2016-10-11 DIAGNOSIS — Z7901 Long term (current) use of anticoagulants: Secondary | ICD-10-CM | POA: Diagnosis not present

## 2016-10-11 DIAGNOSIS — I482 Chronic atrial fibrillation, unspecified: Secondary | ICD-10-CM

## 2016-10-11 DIAGNOSIS — E1151 Type 2 diabetes mellitus with diabetic peripheral angiopathy without gangrene: Secondary | ICD-10-CM

## 2016-10-11 DIAGNOSIS — Z87891 Personal history of nicotine dependence: Secondary | ICD-10-CM

## 2016-10-11 DIAGNOSIS — I739 Peripheral vascular disease, unspecified: Secondary | ICD-10-CM | POA: Diagnosis not present

## 2016-10-11 DIAGNOSIS — I872 Venous insufficiency (chronic) (peripheral): Secondary | ICD-10-CM | POA: Diagnosis not present

## 2016-10-11 NOTE — Progress Notes (Signed)
[facility] [place]   CC: Follow up peripheral artery occlusive disease  History of Present Illness [name] is a 71 y.o. female patient of Dr. Oneida Alar who presents for evaluation of peripheral arterial disease. She was noted to have an abnormal non invasive arterial study on screening July 2015. She is asymptomatic. She denies claudication, rest pain, or non healing wounds.   Pt states her blood pressure at home is 093'O-671'I systolic, states she took all of her morning medications.  She rides her stationary bike 40 minutes daily.  She walks in place in her house to exercise also. She is intentionally eating healthy and losing weight.  Pt states she is using her knee high compression hose daily with good results.   She states she is blind in her left eye.   She was hospitalized at [facility] in July 2016 for exacerbation of COPD.  Pt Diabetic: Yes, no labresults on file since 2012, pt states FBS is about 100, states her last A1C was 6.4 Pt smoker: former smoker, quit in 2003  Pt meds include: Statin :Yes ASA: No Other anticoagulants/antiplatelets: coumadin for atrial fib      Past Medical History:  Diagnosis Date  . Anemia   . Atrial fibrillation ([place])   . COPD (chronic obstructive pulmonary disease) ([place])   . Diabetes mellitus without complication ([place])   . DVT (deep venous thrombosis) ([place])   . Enlarged heart   . Gangrene ([place])   . Hypercholesteremia   . Hypertension   . Insomnia   . Legally blind in left eye, as defined in [place]   . Stroke [facility])     Social History Social History  Substance Use Topics  . Smoking status: Former Smoker    Quit date: 02/26/2001  . Smokeless tobacco: Never Used  . Alcohol use No    Family History Family History  Problem Relation Age of Onset  . Heart disease Mother   . Hypertension Mother   . Heart attack Mother   . Hypertension Father   . Diabetes Daughter     Past Surgical  History:  Procedure Laterality Date  . bilareral tubal ligation    . EYE SURGERY    . FRACTURE SURGERY Right 2005   Fx  leg  . GLAUCOMA SURGERY Right   . removal of sweat glands     per pt. 30 years ago  . TIBIA FRACTURE SURGERY Right 2010    Allergies  Allergen Reactions  . Sulfa Antibiotics     Swelling, hives  . Sulfamethoxazole Hives  . Tape Other (See Comments)    Pulls skin off. Skin irritation and breakdown    Current Outpatient Prescriptions  Medication Sig Dispense Refill  . albuterol (PROVENTIL HFA;VENTOLIN HFA) 108 (90 BASE) MCG/ACT inhaler Inhale 2 puffs into the lungs every 6 (six) hours as needed for wheezing or shortness of breath.    Marland Kitchen albuterol (PROVENTIL HFA;VENTOLIN HFA) 108 (90 BASE) MCG/ACT inhaler 2 puffs.    Marland Kitchen atorvastatin (LIPITOR) 10 MG tablet Take 10 mg by mouth daily.    Marland Kitchen b complex vitamins capsule Take 1 capsule by mouth daily.    . Blood Glucose Monitoring Suppl (FORA V30A BLOOD GLUCOSE SYSTEM) DEVI by Does not apply route.    . cloNIDine (CATAPRES) 0.2 MG tablet Take 0.1 mg by mouth 2 (two) times daily. Reported on 09/09/2015    . Dorzolamide HCl-Timolol Mal PF 22.3-6.8 MG/ML SOLN Apply to eye.    Marland Kitchen  furosemide (LASIX) 40 MG tablet Take 40 mg by mouth. 1 tab am, 1/2 tab in pm    . hydrALAZINE (APRESOLINE) 25 MG tablet Take 50 mg by mouth 2 (two) times daily.     . Iron, Ferrous Gluconate, 256 (28 FE) MG TABS Take 324 mg by mouth daily.     . metFORMIN (GLUCOPHAGE) 500 MG tablet Take 500 mg by mouth. One tablet twice daily    . pantoprazole (PROTONIX) 40 MG tablet Take 40 mg by mouth daily.    . potassium chloride (K-DUR) 10 MEQ tablet Take 20 mEq by mouth daily. 1 1/2 tablet daily,Take with lasix    . valsartan (DIOVAN) 160 MG tablet Take 160 mg by mouth daily.     Marland Kitchen warfarin (COUMADIN) 5 MG tablet Take 7.5 mg by mouth daily. Takes 2 tabs Mon, Wed, Fri. Takes 7.5mg  Tues, Thur, Sat, Sun.    . diltiazem (CARDIZEM CD) 240 MG 24 hr capsule Take 240 mg  by mouth daily.     No current facility-administered medications for this visit.     ROS: See HPI for pertinent positives and negatives.   Physical Examination  Vitals:   10/11/16 0945 10/11/16 0948  BP: (!) 188/75 (!) 177/74  Pulse: (!) 52   Resp: 18   Temp: (!) 97.5 F (36.4 C)   TempSrc: Oral   SpO2: 97%   Weight: 229 lb (103.9 kg)   Height: 5\' 8"  (1.727 m)    Body mass index is 34.82 kg/m.  General: A&O x 3, WDWN, obese female. Gait: holds daughter's hand, steady Eyes: Right eye pupil is round and reactive to light, left pupil is irregular shaped, not reactive to light  Pulmonary: Respirations are non labored, CTAB, without wheezes, rhonchi, or rales. Cardiac: Irregular Rhythm, rate at 52/minute, no detected murmur.     Carotid Bruits Right Left   Negative Negative   Abdominal aortic pulse is not palpable. Radial pulses: are 2+ palpable   VASCULAR EXAM: Extremities: without ischemic changes, without Gangrene; without open wounds. Right lower leg with 1+ pitting and nonpitting edema, hemosiderin deposits in both lower legs indicating chronic venous insufficiency. Left lower leg with trace pitting edema.     LE Pulses Right Left   FEMORAL not palpable not palpable    POPLITEAL not palpable  not palpable   POSTERIOR TIBIAL not palpable  not palpable    DORSALIS PEDIS  ANTERIOR TIBIAL not palpable  not palpable    Abdomen: softly obese, NT, no palpable masses. Skin: no rashes, no ulcers, see extremities. Musculoskeletal: no muscle wasting or atrophy. Neurologic: A&O X 2; Appropriate Affect, MOTOR FUNCTION: moving all extremities equally, motor strength 4/5 throughout. Speech is sparse but clear. CN 2-12  intact     ASSESSMENT: Bethany [name] is a 71 y.o. female who presents for evaluation of peripheral arterial disease. She was noted to have an abnormal non invasive arterial study on screening in July 2015. She is asymptomatic. She denies claudication, denies rest pain, she has no signs of ischemia in her lower extremites. She exercises and walks a great deal daily.  Her atherosclerotic risk factors include well controlled DM and former smoker (quit in 2003). She takes coumadin for atrial fib.  I congratulated her on her excellent lifestyle habits and encouraged her to continue   DATA   ABI (Date: 10/11/2016)  R:   ABI: 0.92 (was 0.94 on 09-09-15),   PT: bi (was bi)  DP: bi (was bi)  TBI:  0.60  L:   ABI: 0.65 (was 0.60)),   PT: bi (was mono)  DP: bi (was mono)  TBI: 0.33 ABI's are stable: mild arterial occlusive disease on the right, moderate on the left. Left LE waveforms have improved from mono to biphasic.    PLAN:  Continue excellent daily exercise routine on stationary bike.   Based on the patient's vascular studies and examination, pt will return to clinic in 1 year with ABI's. I advised her to notify us if she develops concerns re the circulation in her feet or legs.   I discussed in depth with the patient the nature of atherosclerosis, and emphasized the importance of maximal medical management including strict control of blood pressure, blood glucose, and lipid levels, obtaining regular exercise, and continued cessation of smoking.  The patient is aware that without maximal medical management the underlying atherosclerotic disease process will progress, limiting the benefit of any interventions.  The patient was given information about PAD including signs, symptoms, treatment, what symptoms should prompt the patient to seek immediate medical care, and risk reduction measures to take.  Clemon Chambers, RN, MSN, FNP-C [facility]  [employer] Phone: [phone]  Clinic MD: [facility]  10/11/16 10:24 AM

## 2016-10-11 NOTE — Patient Instructions (Signed)

## 2016-10-15 NOTE — Addendum Note (Signed)
Addended by: Lianne Cure A on: 10/15/2016 04:03 PM   Modules accepted: Orders

## 2017-05-02 ENCOUNTER — Ambulatory Visit (INDEPENDENT_AMBULATORY_CARE_PROVIDER_SITE_OTHER): Payer: Medicare Other | Admitting: Family

## 2017-05-02 ENCOUNTER — Ambulatory Visit (HOSPITAL_COMMUNITY)
Admission: RE | Admit: 2017-05-02 | Discharge: 2017-05-02 | Disposition: A | Discharge status: Home / Self Care | Payer: Medicare Other | Source: Ambulatory Visit | Attending: Family | Admitting: Family

## 2017-05-02 ENCOUNTER — Encounter: Payer: Self-pay | Admitting: Family

## 2017-05-02 VITALS — BP 178/70 | HR 54 | Resp 18 | Ht 68.0 in | Wt 232.6 lb

## 2017-05-02 DIAGNOSIS — Z87891 Personal history of nicotine dependence: Secondary | ICD-10-CM | POA: Insufficient documentation

## 2017-05-02 DIAGNOSIS — E1151 Type 2 diabetes mellitus with diabetic peripheral angiopathy without gangrene: Secondary | ICD-10-CM | POA: Diagnosis present

## 2017-05-02 DIAGNOSIS — I482 Chronic atrial fibrillation, unspecified: Secondary | ICD-10-CM

## 2017-05-02 DIAGNOSIS — Z8673 Personal history of transient ischemic attack (TIA), and cerebral infarction without residual deficits: Secondary | ICD-10-CM

## 2017-05-02 DIAGNOSIS — Z7901 Long term (current) use of anticoagulants: Secondary | ICD-10-CM

## 2017-05-02 DIAGNOSIS — I872 Venous insufficiency (chronic) (peripheral): Secondary | ICD-10-CM | POA: Diagnosis not present

## 2017-05-02 NOTE — Patient Instructions (Signed)

## 2017-05-02 NOTE — Progress Notes (Signed)
[facility] OF [place]   CC: Follow up peripheral artery occlusive disease  History of Present Illness Bethany [name] is a 72 y.o. female whom Dr. Oneida Alar has been monitoring for peripheral arterial disease.  She was noted to have an abnormal non invasive arterial study on screening July 2015. She is asymptomatic. She denies claudication, rest pain, or non healing wounds.   Pt states her blood pressure at home is 062'I-948'N systolic, states she took all of her morning medications.  She rides her stationary bike 40 minutes daily.  She walks in place in her house to exercise also. She is intentionally eating healthy and losing weight.  Pt states she is using her knee high compression hose daily with good results.   She states she is blind in her left eye.   She was hospitalized at [facility] in July 2016 for exacerbation of COPD.  Pt Diabetic: Yes, no lab results on file since 2012, pt states FBS is about 100, states her last A1C was 6.? Pt smoker: former smoker, quit in 2003  Pt meds include: Statin :Yes ASA: No Other anticoagulants/antiplatelets: coumadin for atrial fib     Past Medical History:  Diagnosis Date  . Anemia   . Atrial fibrillation ([place])   . COPD (chronic obstructive pulmonary disease) ([place])   . Diabetes mellitus without complication ([place])   . DVT (deep venous thrombosis) ([place])   . Enlarged heart   . Gangrene ([place])   . Hypercholesteremia   . Hypertension   . Insomnia   . Legally blind in left eye, as defined in [place]   . Stroke [facility])     Social History Social History   Tobacco Use  . Smoking status: Former Smoker    Last attempt to quit: 02/26/2001    Years since quitting: 16.1  . Smokeless tobacco: Never Used  Substance Use Topics  . Alcohol use: No    Alcohol/week: 0.0 oz  . Drug use: No    Family History Family History  Problem Relation Age of Onset  . Heart disease Mother   . Hypertension Mother   .  Heart attack Mother   . Hypertension Father   . Diabetes Daughter     Past Surgical History:  Procedure Laterality Date  . bilareral tubal ligation    . EYE SURGERY    . FRACTURE SURGERY Right 2005   Fx  leg  . GLAUCOMA SURGERY Right   . removal of sweat glands     per pt. 30 years ago  . TIBIA FRACTURE SURGERY Right 2010    Allergies  Allergen Reactions  . Sulfa Antibiotics     Swelling, hives  . Sulfamethoxazole Hives  . Tape Other (See Comments)    Pulls skin off. Skin irritation and breakdown    Current Outpatient Medications  Medication Sig Dispense Refill  . albuterol (PROVENTIL HFA;VENTOLIN HFA) 108 (90 BASE) MCG/ACT inhaler Inhale 2 puffs into the lungs every 6 (six) hours as needed for wheezing or shortness of breath.    Marland Kitchen albuterol (PROVENTIL HFA;VENTOLIN HFA) 108 (90 BASE) MCG/ACT inhaler 2 puffs.    Marland Kitchen atorvastatin (LIPITOR) 10 MG tablet Take 10 mg by mouth daily.    Marland Kitchen b complex vitamins capsule Take 1 capsule by mouth daily.    . Blood Glucose Monitoring Suppl (FORA V30A BLOOD GLUCOSE SYSTEM) DEVI by Does not apply route.    . cloNIDine (CATAPRES) 0.2 MG tablet Take 0.1 mg by mouth 2 (two)  times daily. Reported on 09/09/2015    . diltiazem (CARDIZEM CD) 240 MG 24 hr capsule Take 240 mg by mouth daily.    . Dorzolamide HCl-Timolol Mal PF 22.3-6.8 MG/ML SOLN Apply to eye.    . furosemide (LASIX) 40 MG tablet Take 40 mg by mouth. 1 tab am, 1/2 tab in pm    . hydrALAZINE (APRESOLINE) 25 MG tablet Take 50 mg by mouth 2 (two) times daily.     . Iron, Ferrous Gluconate, 256 (28 FE) MG TABS Take 324 mg by mouth daily.     . metFORMIN (GLUCOPHAGE) 500 MG tablet Take 500 mg by mouth. One tablet twice daily    . pantoprazole (PROTONIX) 40 MG tablet Take 40 mg by mouth daily.    . potassium chloride (K-DUR) 10 MEQ tablet Take 20 mEq by mouth daily. 1 1/2 tablet daily,Take with lasix    . valsartan (DIOVAN) 160 MG tablet Take 160 mg by mouth daily.     Marland Kitchen warfarin (COUMADIN)  5 MG tablet Take 7.5 mg by mouth daily. Takes 2 tabs Mon, Wed, Fri. Takes 7.5mg  Harrell Lark, Sat, Sun.     No current facility-administered medications for this visit.     ROS: See HPI for pertinent positives and negatives.   Physical Examination  Vitals:   05/02/17 1049 05/02/17 1052  BP: (!) 180/68 (!) 178/70  Pulse: (!) 54   Resp: 18   SpO2: 96%   Weight: 232 lb 9.6 oz (105.5 kg)   Height: 5\' 8"  (1.727 m)    Body mass index is 35.37 kg/m.  General: A&O x 3, WDWN, obese female. Gait: holds daughter's hand, steady Eyes: Right eye pupil is round and reactive to light, left pupil is irregular shaped, not reactive to light  Pulmonary: Respirations are non labored, CTAB, without wheezes, rhonchi, or rales. Cardiac: Irregular Rhythm, bradycardic (not on a beta blocker), no detected murmur.     Carotid Bruits Right Left   Negative Negative   Abdominal aortic pulse is not palpable. Radial pulses: are 2+ palpable   VASCULAR EXAM: Extremities: without ischemic changes, without Gangrene; without open wounds. Right lower leg with 1+ pitting and nonpitting edema, hemosiderin deposits in both lower legs indicating chronic venous insufficiency. Left lower leg with trace pitting edema.     LE Pulses Right Left   FEMORAL 1+ palpable not palpable    POPLITEAL not palpable  not palpable   POSTERIOR TIBIAL not palpable  not palpable    DORSALIS PEDIS  ANTERIOR TIBIAL not palpable  not palpable    Abdomen: softly obese, NT, no palpable masses. Skin: no rashes, no ulcers, see extremities. Musculoskeletal: no muscle wasting or atrophy. Neurologic: A&O X 2; Appropriate Affect, MOTOR FUNCTION: moving all extremities equally, motor  strength 4/5 throughout. Speech is sparse but clear. CN 2-12 intact Psychiatric: Thought content is normal, mood appropriate for clinical situation.     ASSESSMENT: Bethany [name] is a 72 y.o. female presents for evaluation of peripheral arterial disease. She was noted to have an abnormal non invasive arterial study on screening in July 2015. She is asymptomatic. She denies claudication, denies rest pain, she has no signs of ischemia in her lower extremites. She exercises and walks a great deal daily.  Her atherosclerotic risk factors include well controlled DM and former smoker (quit in 2003). She takes coumadin for atrial fib.  I congratulated her on her excellent lifestyle habits and encouraged her to continue   DATA  ABI (  Date: 05/02/2017):  R:   ABI: 0.79 (was 0.92),   PT: waveform morphology not documented (was bi)   DP: waveform morphology not documented (was bi)     TBI:  0.63 (was 0.60)  L:   ABI: 0.63 (was 0.65),   PT: waveform morphology not documented (was bi)   DP: waveform morphology not documented (was bi)    TBI: 0.54 (was 0.33) Decline in right ABI from mild to moderate disease, stable in the left with moderate disease.   PLAN:  Continue excellent daily exercise routine on stationary bike.  Add daily seated leg exercises as demonstrated and discussed with pt and her daughter.   Based on the patient's vascular studies and examination, pt will return to clinic in 6 months with ABI's. I advised her to notify us if she develops concerns re the circulation in her feet or legs.    I discussed in depth with the patient the nature of atherosclerosis, and emphasized the importance of maximal medical management including strict control of blood pressure, blood glucose, and lipid levels, obtaining regular exercise, and continued cessation of smoking.  The patient is aware that without maximal medical management the underlying atherosclerotic disease process  will progress, limiting the benefit of any interventions.  The patient was given information about PAD including signs, symptoms, treatment, what symptoms should prompt the patient to seek immediate medical care, and risk reduction measures to take.  Clemon Chambers, RN, MSN, FNP-C [facility] [employer] Phone: [phone]  Clinic MD: [facility]  05/02/17 10:59 AM

## 2017-05-15 ENCOUNTER — Other Ambulatory Visit: Payer: Self-pay

## 2017-05-15 DIAGNOSIS — I739 Peripheral vascular disease, unspecified: Secondary | ICD-10-CM

## 2017-05-22 DIAGNOSIS — R195 Other fecal abnormalities: Secondary | ICD-10-CM | POA: Insufficient documentation

## 2017-07-12 DIAGNOSIS — D126 Benign neoplasm of colon, unspecified: Secondary | ICD-10-CM | POA: Insufficient documentation

## 2017-11-01 ENCOUNTER — Ambulatory Visit: Payer: Medicare Other | Admitting: Family

## 2017-11-01 ENCOUNTER — Encounter (HOSPITAL_COMMUNITY): Payer: Medicare Other

## 2017-11-29 ENCOUNTER — Ambulatory Visit (HOSPITAL_COMMUNITY)
Admission: RE | Admit: 2017-11-29 | Discharge: 2017-11-29 | Disposition: A | Discharge status: Home / Self Care | Payer: Medicare Other | Source: Ambulatory Visit | Attending: Family | Admitting: Family

## 2017-11-29 ENCOUNTER — Ambulatory Visit: Payer: Medicare Other | Admitting: Family

## 2017-11-29 ENCOUNTER — Encounter: Payer: Self-pay | Admitting: Family

## 2017-11-29 ENCOUNTER — Ambulatory Visit (INDEPENDENT_AMBULATORY_CARE_PROVIDER_SITE_OTHER): Payer: Medicare Other | Admitting: Family

## 2017-11-29 ENCOUNTER — Encounter (HOSPITAL_COMMUNITY): Payer: Medicare Other

## 2017-11-29 ENCOUNTER — Other Ambulatory Visit: Payer: Self-pay

## 2017-11-29 VITALS — BP 187/74 | HR 55 | Temp 98.1°F | Resp 18 | Ht 68.0 in | Wt 225.0 lb

## 2017-11-29 DIAGNOSIS — R9389 Abnormal findings on diagnostic imaging of other specified body structures: Secondary | ICD-10-CM | POA: Diagnosis not present

## 2017-11-29 DIAGNOSIS — I739 Peripheral vascular disease, unspecified: Secondary | ICD-10-CM

## 2017-11-29 DIAGNOSIS — I482 Chronic atrial fibrillation, unspecified: Secondary | ICD-10-CM

## 2017-11-29 DIAGNOSIS — E1151 Type 2 diabetes mellitus with diabetic peripheral angiopathy without gangrene: Secondary | ICD-10-CM

## 2017-11-29 DIAGNOSIS — Z8673 Personal history of transient ischemic attack (TIA), and cerebral infarction without residual deficits: Secondary | ICD-10-CM

## 2017-11-29 DIAGNOSIS — I872 Venous insufficiency (chronic) (peripheral): Secondary | ICD-10-CM

## 2017-11-29 DIAGNOSIS — Z87891 Personal history of nicotine dependence: Secondary | ICD-10-CM | POA: Diagnosis not present

## 2017-11-29 DIAGNOSIS — R0989 Other specified symptoms and signs involving the circulatory and respiratory systems: Secondary | ICD-10-CM | POA: Diagnosis present

## 2017-11-29 DIAGNOSIS — Z7901 Long term (current) use of anticoagulants: Secondary | ICD-10-CM | POA: Diagnosis not present

## 2017-11-29 NOTE — Progress Notes (Signed)
[facility] OF [place]   CC: Follow up peripheral artery occlusive disease  History of Present Illness NOLENE ROCKS is a 72 y.o. female whom Dr. Oneida Alar has been monitoring for peripheral arterial disease.  She was noted to have an abnormal non invasive arterial study on screening July 2015. She is asymptomatic. She denies claudication, rest pain, or non healing wounds.   Pt states her blood pressure at home is 130's-140'ssystolic, states she took all of her morning medications. Pt denies chest pain, denies dyspnea, denies headache.   She rides her stationary bike 40 minutes daily. She walks in place in her house to [place]. She is intentionally eating healthy and losing weight.  Pt states she is using her knee high compression hose daily with good results.  She states sheis blind in her left eye.   She was hospitalized at [facility] in July 2016 for exacerbation of COPD.  Pt Diabetic: Yes, no lab results on file since 2012, pt states FBS is about 100, states her last A1C was 6.2 Pt smoker: former smoker, quit in 2003  Pt meds include: Statin :Yes ASA: No Other anticoagulants/antiplatelets: coumadin for atrial fib      Past Medical History:  Diagnosis Date  . Anemia   . Atrial fibrillation ([place])   . COPD (chronic obstructive pulmonary disease) ([place])   . Diabetes mellitus without complication ([place])   . DVT (deep venous thrombosis) ([place])   . Enlarged heart   . Gangrene ([place])   . Hypercholesteremia   . Hypertension   . Insomnia   . Legally blind in left eye, as defined in [place]   . Stroke [facility])     Social History Social History   Tobacco Use  . Smoking status: Former Smoker    Last attempt to quit: 02/26/2001    Years since quitting: 16.7  . Smokeless tobacco: Never Used  Substance Use Topics  . Alcohol use: No    Alcohol/week: 0.0 standard drinks  . Drug use: No    Family History Family History  Problem Relation  Age of Onset  . Heart disease Mother   . Hypertension Mother   . Heart attack Mother   . Hypertension Father   . Diabetes Daughter     Past Surgical History:  Procedure Laterality Date  . bilareral tubal ligation    . EYE SURGERY    . FRACTURE SURGERY Right 2005   Fx  leg  . GLAUCOMA SURGERY Right   . removal of sweat glands     per pt. 30 years ago  . TIBIA FRACTURE SURGERY Right 2010    Allergies  Allergen Reactions  . Sulfa Antibiotics     Swelling, hives  . Sulfamethoxazole Hives  . Tape Other (See Comments)    Pulls skin off. Skin irritation and breakdown    Current Outpatient Medications  Medication Sig Dispense Refill  . albuterol (PROVENTIL HFA;VENTOLIN HFA) 108 (90 BASE) MCG/ACT inhaler Inhale 2 puffs into the lungs every 6 (six) hours as needed for wheezing or shortness of breath.    Marland Kitchen albuterol (PROVENTIL HFA;VENTOLIN HFA) 108 (90 BASE) MCG/ACT inhaler 2 puffs.    Marland Kitchen atorvastatin (LIPITOR) 10 MG tablet Take 10 mg by mouth daily.    Marland Kitchen b complex vitamins capsule Take 1 capsule by mouth daily.    . Blood Glucose Monitoring Suppl (FORA V30A BLOOD GLUCOSE SYSTEM) DEVI by Does not apply route.    . candesartan (ATACAND) 32 MG tablet Take  32 mg by mouth daily.  3  . cloNIDine (CATAPRES) 0.2 MG tablet Take 0.1 mg by mouth 2 (two) times daily. Reported on 09/09/2015    . diltiazem (CARDIZEM CD) 240 MG 24 hr capsule Take 300 mg by mouth daily.     . Dorzolamide HCl-Timolol Mal PF 22.3-6.8 MG/ML SOLN Apply to eye.    . furosemide (LASIX) 40 MG tablet Take 40 mg by mouth. 1 tab am, 1/2 tab in pm    . hydrALAZINE (APRESOLINE) 25 MG tablet Take 50 mg by mouth 2 (two) times daily.     . Iron, Ferrous Gluconate, 256 (28 FE) MG TABS Take 324 mg by mouth daily.     . metFORMIN (GLUCOPHAGE) 500 MG tablet Take 500 mg by mouth. One tablet twice daily    . pantoprazole (PROTONIX) 40 MG tablet Take 40 mg by mouth daily.    . potassium chloride (K-DUR) 10 MEQ tablet Take 20 mEq by  mouth daily. 1 1/2 tablet daily,Take with lasix    . valsartan (DIOVAN) 160 MG tablet Take 160 mg by mouth daily.     Marland Kitchen warfarin (COUMADIN) 5 MG tablet Take 7.5 mg by mouth daily. Takes 2 tabs Mon, Wed, Fri. Takes 7.5mg  Harrell Lark, Sat, Sun.     No current facility-administered medications for this visit.     ROS: See HPI for pertinent positives and negatives.   Physical Examination  Vitals:   11/29/17 0835 11/29/17 0842  BP: (!) 190/75 (!) 187/74  Pulse: (!) 55   Resp: 18   Temp: 98.1 F (36.7 C)   TempSrc: Oral   SpO2: 97%   Weight: 225 lb (102.1 kg)   Height: 5\' 8"  (1.727 m)    Body mass index is 34.21 kg/m.  General: A&O x 3, WDWN, obese female. Gait:holds daughter's hand, steady Eyes:Right eye pupil is round and reactive to light, left pupil is irregular shaped, not reactive to light Pulmonary:Respirations are non labored, CTAB, without wheezes,rhonchi, orrales. Cardiac: IrregularRhythm, bradycardic (not on a beta blocker), no detected murmur.     Carotid Bruits Right Left   Negative Negative   Abdominal aortic pulseis not palpable. Radial pulses: are 2+ palpable   VASCULAR EXAM: Extremities: without ischemic changes, without Gangrene; without open wounds. Right lower leg with1+pitting and nonpitting edema, hemosiderin deposits in both lower legsindicating chronic venous insufficiency. Left lower leg with trace pitting edema.     LE Pulses Right Left   FEMORAL 1+ palpable not palpable    POPLITEAL not palpable  not palpable   POSTERIOR TIBIAL not palpable  not palpable    DORSALIS PEDIS  ANTERIOR TIBIAL not palpable  not palpable    Abdomen:softly obese, NT, no palpable masses. Skin: no rashes, no  ulcers, see extremities. Musculoskeletal:no muscle wasting or atrophy. Neurologic: A&O X 2; Appropriate Affect, MOTOR FUNCTION: moving all extremities equally, motor strength 4/5 throughout. Speech is sparse but clear. CN 2-12 intact Psychiatric: Thought content is normal, mood appropriate for clinical situation     ASSESSMENT: GIOVANA FACIANE is a 72 y.o. female presents for evaluation of peripheral arterial disease. She was noted to have an abnormal non invasive arterial study on screening in July 2015. She is asymptomatic. She denies claudication, denies rest pain, she has no signs of ischemia in her lower extremites. She exercises and walks a great deal daily.  Her atherosclerotic risk factors include well controlled DM and former smoker (quit in 2003). She takes coumadin for atrial fib.  I congratulated her on her excellent lifestyle habits and encouraged her to continue   DATA  ABI (Date: 11/29/2017):   R:   ABI: 0.88 (was 0.79 on 05-02-17),   PT: bi  DP: bi  TBI:  0.61, toe pressure 120, (was 0.63)  L:   ABI: 0.67 (was 0.63),   PT: mono  DP: mono  TBI: 0.37, toe pressure 73, (was 0.54) Improved right ABI to mild disease, biphasic waveforms. Slightly improved left ABI, moderate disease, monophasic waveforms. Stable right TBI, decline in left TBI.    PLAN:  Continue excellent daily exercise routine on stationary bike. Continue daily seated leg exercises as demonstrated and discussed with pt and her daughter.   Based on the patient's vascular studies and examination, pt will return to clinic in 1 year with ABI's. I advised her to notify us if she develops concerns re the circulation in her feet or legs.    I discussed in depth with the patient the nature of atherosclerosis, and emphasized the importance of maximal medical management including strict control of blood pressure, blood glucose, and lipid levels, obtaining regular exercise, and  continued cessation of smoking.  The patient is aware that without maximal medical management the underlying atherosclerotic disease process will progress, limiting the benefit of any interventions.  The patient was given information about PAD including signs, symptoms, treatment, what symptoms should prompt the patient to seek immediate medical care, and risk reduction measures to take.  Clemon Chambers, RN, MSN, FNP-C [facility] [employer] Phone: [phone]  Clinic MD: [facility]  11/29/17 9:07 AM

## 2017-11-29 NOTE — Patient Instructions (Addendum)
  To decrease swelling in your feet and legs: Elevate feet above slightly bent knees, feet above heart, overnight and 3-4 times per day for 20 minutes.    Peripheral Vascular Disease Peripheral vascular disease (PVD) is a disease of the blood vessels that are not part of your heart and brain. A simple term for PVD is poor circulation. In most cases, PVD narrows the blood vessels that carry blood from your heart to the rest of your body. This can result in a decreased supply of blood to your arms, legs, and internal organs, like your stomach or kidneys. However, it most often affects a person's lower legs and feet. There are two types of PVD.  Organic PVD. This is the more common type. It is caused by damage to the structure of blood vessels.  Functional PVD. This is caused by conditions that make blood vessels contract and tighten (spasm).  Without treatment, PVD tends to get worse over time. PVD can also lead to acute ischemic limb. This is when an arm or limb suddenly has trouble getting enough blood. This is a medical emergency. Follow these instructions at home:  Take medicines only as told by your doctor.  Do not use any tobacco products, including cigarettes, chewing tobacco, or electronic cigarettes. If you need help quitting, ask your doctor.  Lose weight if you are overweight, and maintain a healthy weight as told by your doctor.  Eat a diet that is low in fat and cholesterol. If you need help, ask your doctor.  Exercise regularly. Ask your doctor for some good activities for you.  Take good care of your feet. ? Wear comfortable shoes that fit well. ? Check your feet often for any cuts or sores. Contact a doctor if:  You have cramps in your legs while walking.  You have leg pain when you are at rest.  You have coldness in a leg or foot.  Your skin changes.  You are unable to get or have an erection (erectile dysfunction).  You have cuts or sores on your feet that  are not healing. Get help right away if:  Your arm or leg turns cold and blue.  Your arms or legs become red, warm, swollen, painful, or numb.  You have chest pain or trouble breathing.  You suddenly have weakness in your face, arm, or leg.  You become very confused or you cannot speak.  You suddenly have a very bad headache.  You suddenly cannot see. This information is not intended to replace advice given to you by your health care provider. Make sure you discuss any questions you have with your health care provider. Document Released: 05/09/2009 Document Revised: 07/21/2015 Document Reviewed: 07/23/2013 Elsevier Interactive Patient Education  2017 Elsevier Inc.  

## 2017-11-30 ENCOUNTER — Encounter: Payer: Self-pay | Admitting: Family

## 2018-05-12 DIAGNOSIS — I5032 Chronic diastolic (congestive) heart failure: Secondary | ICD-10-CM | POA: Insufficient documentation

## 2018-08-05 ENCOUNTER — Other Ambulatory Visit: Payer: Self-pay | Admitting: *Deleted

## 2018-08-05 DIAGNOSIS — I83811 Varicose veins of right lower extremities with pain: Secondary | ICD-10-CM

## 2018-09-04 ENCOUNTER — Encounter: Payer: Medicare Other | Admitting: Vascular Surgery

## 2018-09-04 ENCOUNTER — Ambulatory Visit (HOSPITAL_COMMUNITY): Payer: Medicare Other

## 2018-09-24 ENCOUNTER — Encounter: Payer: Self-pay | Admitting: Vascular Surgery

## 2018-09-24 ENCOUNTER — Other Ambulatory Visit: Payer: Self-pay

## 2018-09-24 ENCOUNTER — Ambulatory Visit (INDEPENDENT_AMBULATORY_CARE_PROVIDER_SITE_OTHER): Payer: Medicare Other | Admitting: Vascular Surgery

## 2018-09-24 ENCOUNTER — Ambulatory Visit (HOSPITAL_COMMUNITY)
Admission: RE | Admit: 2018-09-24 | Discharge: 2018-09-24 | Disposition: A | Discharge status: Home / Self Care | Payer: Medicare Other | Source: Ambulatory Visit | Attending: Vascular Surgery | Admitting: Vascular Surgery

## 2018-09-24 VITALS — BP 157/62 | HR 58 | Temp 98.0°F | Resp 16 | Ht 68.0 in | Wt 216.8 lb

## 2018-09-24 DIAGNOSIS — I890 Lymphedema, not elsewhere classified: Secondary | ICD-10-CM | POA: Diagnosis not present

## 2018-09-24 DIAGNOSIS — I83811 Varicose veins of right lower extremities with pain: Secondary | ICD-10-CM | POA: Insufficient documentation

## 2018-09-24 NOTE — Progress Notes (Signed)
Patient name: Bethany [name] MRN: [id number] DOB: 1945/03/01 Sex: female  REASON FOR CONSULT: Right ankle pain  HPI: Bethany [name] is a 73 y.o. female, who we have previously seen for peripheral arterial disease.  She was last seen October 2019 by her nurse practitioner.  Currently complains of pain in her right ankle.  She was recently hospitalized and told that she had gout in the right ankle.  She states the pain in her right ankle has improved somewhat since discharge from the hospital but she wanted this evaluated.  She has chronic swelling in the right leg after 2 prior fractures in the right leg with greater than 10 years of leg swelling.  She has not worn compression stockings in the past.  She does have a history of DVT in the past.  Other medical problems include elevated cholesterol hypertension atrial fibrillation COPD diabetes.  These are all currently stable.  She has no history of claudication rest pain or nonhealing wounds in the foot.  Past Medical History:  Diagnosis Date  . Anemia   . Atrial fibrillation ([place])   . COPD (chronic obstructive pulmonary disease) ([place])   . Diabetes mellitus without complication ([place])   . DVT (deep venous thrombosis) ([place])   . Enlarged heart   . Gangrene ([place])   . Hypercholesteremia   . Hypertension   . Insomnia   . Legally blind in left eye, as defined in [place]   . Stroke [facility])    Past Surgical History:  Procedure Laterality Date  . bilareral tubal ligation    . EYE SURGERY    . FRACTURE SURGERY Right 2005   Fx  leg  . GLAUCOMA SURGERY Right   . removal of sweat glands     per pt. 30 years ago  . TIBIA FRACTURE SURGERY Right 2010    Family History  Problem Relation Age of Onset  . Heart disease Mother   . Hypertension Mother   . Heart attack Mother   . Hypertension Father   . Diabetes Daughter     SOCIAL HISTORY: Social History   Socioeconomic History  . Marital status: Divorced    Spouse name: Not on file  . Number of  children: 3  . Years of education: 67 th   . Highest education level: Not on file  Occupational History  . Not on file  Social Needs  . Financial resource strain: Not on file  . Food insecurity    Worry: Not on file    Inability: Not on file  . Transportation needs    Medical: Not on file    Non-medical: Not on file  Tobacco Use  . Smoking status: Former Smoker    Quit date: 02/26/2001    Years since quitting: 17.5  . Smokeless tobacco: Never Used  Substance and Sexual Activity  . Alcohol use: No    Alcohol/week: 0.0 standard drinks  . Drug use: No  . Sexual activity: Not on file  Lifestyle  . Physical activity    Days per week: Not on file    Minutes per session: Not on file  . Stress: Not on file  Relationships  . Social Herbalist on phone: Not on file    Gets together: Not on file    Attends religious service: Not on file    Active member of club or organization: Not on file    Attends meetings of clubs or organizations: Not on  file    Relationship status: Not on file  . Intimate partner violence    Fear of current or ex partner: Not on file    Emotionally abused: Not on file    Physically abused: Not on file    Forced sexual activity: Not on file  Other Topics Concern  . Not on file  Social History Narrative   Caffeine 1 cup daily.          Divorced. Home alone.  3 kids, (2 deceased).  11th grade.    Allergies  Allergen Reactions  . Sulfa Antibiotics     Swelling, hives  . Sulfamethoxazole Hives  . Tape Other (See Comments)    Pulls skin off. Skin irritation and breakdown    Current Outpatient Medications  Medication Sig Dispense Refill  . albuterol (PROVENTIL HFA;VENTOLIN HFA) 108 (90 BASE) MCG/ACT inhaler Inhale 2 puffs into the lungs every 6 (six) hours as needed for wheezing or shortness of breath.    Marland Kitchen albuterol (PROVENTIL HFA;VENTOLIN HFA) 108 (90 BASE) MCG/ACT inhaler 2 puffs.    Marland Kitchen atorvastatin (LIPITOR) 10 MG tablet Take 10 mg by  mouth daily.    Marland Kitchen b complex vitamins capsule Take 1 capsule by mouth daily.    . Blood Glucose Monitoring Suppl (FORA V30A BLOOD GLUCOSE SYSTEM) DEVI by Does not apply route.    . candesartan (ATACAND) 32 MG tablet Take 32 mg by mouth daily.  3  . cloNIDine (CATAPRES) 0.2 MG tablet Take 0.1 mg by mouth 2 (two) times daily. Reported on 09/09/2015    . diltiazem (CARDIZEM CD) 240 MG 24 hr capsule Take 300 mg by mouth daily.     . Dorzolamide HCl-Timolol Mal PF 22.3-6.8 MG/ML SOLN Apply to eye.    . furosemide (LASIX) 40 MG tablet Take 40 mg by mouth. 1 tab am, 1/2 tab in pm    . hydrALAZINE (APRESOLINE) 25 MG tablet Take 50 mg by mouth 2 (two) times daily.     . Iron, Ferrous Gluconate, 256 (28 FE) MG TABS Take 324 mg by mouth daily.     . metFORMIN (GLUCOPHAGE) 500 MG tablet Take 500 mg by mouth. One tablet twice daily    . pantoprazole (PROTONIX) 40 MG tablet Take 40 mg by mouth daily.    . potassium chloride (K-DUR) 10 MEQ tablet Take 20 mEq by mouth daily. 1 1/2 tablet daily,Take with lasix    . valsartan (DIOVAN) 160 MG tablet Take 160 mg by mouth daily.     Marland Kitchen warfarin (COUMADIN) 5 MG tablet Take 7.5 mg by mouth daily. Takes 2 tabs Mon, Wed, Fri. Takes 7.5mg  Harrell Lark, Sat, Sun.     No current facility-administered medications for this visit.     ROS:   General:  No weight loss, Fever, chills  HEENT: No recent headaches, no nasal bleeding, no visual changes, no sore throat  Neurologic: No dizziness, blackouts, seizures. No recent symptoms of stroke or mini- stroke. No recent episodes of slurred speech, or temporary blindness.  Cardiac: No recent episodes of chest pain/pressure, no shortness of breath at rest.  + shortness of breath with exertion.  + history of atrial fibrillation or irregular heartbeat  Vascular: No history of rest pain in feet.  No history of claudication.  No history of non-healing ulcer, + history of DVT   Pulmonary: No home oxygen, no productive cough, no  hemoptysis,  No asthma or wheezing  Musculoskeletal:  [X]  Arthritis, [ ]  Low back pain,  [  X] Joint pain  Hematologic:No history of hypercoagulable state.  No history of easy bleeding.  No history of anemia  Gastrointestinal: No hematochezia or melena,  No gastroesophageal reflux, no trouble swallowing  Urinary: [ ]  chronic Kidney disease, [ ]  on HD - [ ]  MWF or [ ]  TTHS, [ ]  Burning with urination, [ ]  Frequent urination, [ ]  Difficulty urinating;   Skin: No rashes  Psychological: No history of anxiety,  No history of depression   Physical Examination  Vitals:   09/24/18 1359  BP: (!) 157/62  Pulse: (!) 58  Resp: 16  Temp: 98 F (36.7 C)  TempSrc: Temporal  SpO2: 100%  Weight: 216 lb 12.8 oz (98.3 kg)  Height: 5\' 8"  (1.727 m)    Body mass index is 32.96 kg/m.  General:  Alert and oriented, no acute distress Skin: No rash or ulcer Extremity Pulses:  2+ femoral, absent dorsalis pedis, posterior tibial pulses bilaterally Musculoskeletal: Diffuse right leg nonpitting edema extending from the knee into the foot  Neurologic: Upper and lower extremity motor 5/5 and symmetric  DATA:  Patient had a venous duplex exam today which showed some reflux in the deep system but no superficial venous reflux in the right leg  ASSESSMENT: Right leg swelling most likely secondary to lymphedema from prior fractures in the right leg.  She also has had a prior DVT.  She has no evidence of superficial venous reflux.  Right ankle pain could be degenerative arthritis or as suggested by her previous hospital admission.  Fortunately her symptoms are improving.   PLAN: No intervention planned for her veins as she does not have superficial venous reflux.  She is scheduled to have a follow-up evaluation by our nurse practitioner in October 2020 for maintenance of her peripheral arterial disease.  She is currently asymptomatic.   Ruta Hinds, MD [facility]  Office: [phone] Pager: [phone]

## 2018-09-25 ENCOUNTER — Encounter (HOSPITAL_COMMUNITY): Payer: Medicare Other

## 2019-01-12 ENCOUNTER — Encounter: Payer: Self-pay | Admitting: Internal Medicine

## 2019-01-12 ENCOUNTER — Other Ambulatory Visit: Payer: Self-pay

## 2019-01-12 DIAGNOSIS — I739 Peripheral vascular disease, unspecified: Secondary | ICD-10-CM

## 2019-01-14 ENCOUNTER — Telehealth (HOSPITAL_COMMUNITY): Payer: Self-pay

## 2019-01-14 NOTE — Telephone Encounter (Signed)

## 2019-01-15 ENCOUNTER — Encounter: Payer: Self-pay | Admitting: Family

## 2019-01-15 ENCOUNTER — Ambulatory Visit (HOSPITAL_COMMUNITY)
Admission: RE | Admit: 2019-01-15 | Discharge: 2019-01-15 | Disposition: A | Discharge status: Home / Self Care | Payer: Medicare Other | Source: Ambulatory Visit | Attending: Family | Admitting: Family

## 2019-01-15 ENCOUNTER — Ambulatory Visit (INDEPENDENT_AMBULATORY_CARE_PROVIDER_SITE_OTHER): Payer: Medicare Other | Admitting: Family

## 2019-01-15 ENCOUNTER — Other Ambulatory Visit: Payer: Self-pay

## 2019-01-15 VITALS — BP 157/68 | HR 53 | Temp 97.7°F | Resp 14 | Ht 65.0 in | Wt 222.0 lb

## 2019-01-15 DIAGNOSIS — Z87891 Personal history of nicotine dependence: Secondary | ICD-10-CM

## 2019-01-15 DIAGNOSIS — I739 Peripheral vascular disease, unspecified: Secondary | ICD-10-CM | POA: Insufficient documentation

## 2019-01-15 DIAGNOSIS — Z7901 Long term (current) use of anticoagulants: Secondary | ICD-10-CM | POA: Diagnosis not present

## 2019-01-15 DIAGNOSIS — I872 Venous insufficiency (chronic) (peripheral): Secondary | ICD-10-CM

## 2019-01-15 DIAGNOSIS — E1151 Type 2 diabetes mellitus with diabetic peripheral angiopathy without gangrene: Secondary | ICD-10-CM

## 2019-01-15 DIAGNOSIS — I779 Disorder of arteries and arterioles, unspecified: Secondary | ICD-10-CM | POA: Diagnosis not present

## 2019-01-15 DIAGNOSIS — I482 Chronic atrial fibrillation, unspecified: Secondary | ICD-10-CM

## 2019-01-15 NOTE — Progress Notes (Addendum)
[facility] [place]   CC: Follow up peripheral artery occlusive disease  History of Present Illness Bethany [name] is a 73 y.o. female whomDr. Philis Nettle been monitoringfor peripheral arterial disease.  She was noted to have an abnormal non invasive arterial study on screening July 2015. She is asymptomatic. She denies claudication, rest pain, or non healing wounds.   Pt states her blood pressure at home is 130's-140'ssystolic. Pt denies chest pain, denies dyspnea, denies headache.   She rides her stationary bike 40 minutes daily. She walks in place in her house to [place]. She is intentionally eating healthy and losing weight.  Pt states she is using her knee high compression hose several days/week with good results.  She states sheis blind in her left eye.  Her daughter states that she had a stroke the day her umbilical herniorrhaphy was scheduled, after her coumadin was held for this. Daughter states that her stoke manifested as confusion, no residual neurological deficits except some mild confusion.   She was hospitalized at [facility] in July 2016 for exacerbation of COPD.  She is under evaluation for anemia, states she has an appointment with Dr. Barney Drain soon.   Diabetic: Yes, no lab results on file since 2012, pt states FBS is about 100, states her last A1C was 6.3 Tobaccos use: former smoker, quit in 2003  Pt meds include: Statin :Yes ASA: No Other anticoagulants/antiplatelets: coumadin for atrial fib, she states that her cardiologist is Dr. Denman George (with [facility])    Past Medical History:  Diagnosis Date  . Anemia   . Atrial fibrillation ([place])   . COPD (chronic obstructive pulmonary disease) ([place])   . Diabetes mellitus without complication ([place])   . DVT (deep venous thrombosis) ([place])   . Enlarged heart   . Gangrene ([place])   . Hypercholesteremia   . Hypertension   . Insomnia   . Legally blind in left eye, as  defined in [place]   . Stroke [facility])     Social History Social History   Tobacco Use  . Smoking status: Former Smoker    Quit date: 02/26/2001    Years since quitting: 17.8  . Smokeless tobacco: Never Used  Substance Use Topics  . Alcohol use: No    Alcohol/week: 0.0 standard drinks  . Drug use: No    Family History Family History  Problem Relation Age of Onset  . Heart disease Mother   . Hypertension Mother   . Heart attack Mother   . Hypertension Father   . Diabetes Daughter     Past Surgical History:  Procedure Laterality Date  . bilareral tubal ligation    . EYE SURGERY    . FRACTURE SURGERY Right 2005   Fx  leg  . GLAUCOMA SURGERY Right   . removal of sweat glands     per pt. 30 years ago  . TIBIA FRACTURE SURGERY Right 2010    Allergies  Allergen Reactions  . Sulfa Antibiotics     Swelling, hives  . Sulfamethoxazole Hives  . Tape Other (See Comments)    Pulls skin off. Skin irritation and breakdown    Current Outpatient Medications  Medication Sig Dispense Refill  . albuterol (PROVENTIL HFA;VENTOLIN HFA) 108 (90 BASE) MCG/ACT inhaler Inhale 2 puffs into the lungs every 6 (six) hours as needed for wheezing or shortness of breath.    Marland Kitchen albuterol (PROVENTIL HFA;VENTOLIN HFA) 108 (90 BASE) MCG/ACT inhaler 2 puffs.    Marland Kitchen atorvastatin (LIPITOR)  10 MG tablet Take 10 mg by mouth daily.    Marland Kitchen b complex vitamins capsule Take 1 capsule by mouth daily.    . Blood Glucose Monitoring Suppl (FORA V30A BLOOD GLUCOSE SYSTEM) DEVI by Does not apply route.    . candesartan (ATACAND) 32 MG tablet Take 32 mg by mouth daily.  3  . cloNIDine (CATAPRES) 0.2 MG tablet Take 0.1 mg by mouth 2 (two) times daily. Reported on 09/09/2015    . diltiazem (CARDIZEM CD) 240 MG 24 hr capsule Take 300 mg by mouth daily.     . Dorzolamide HCl-Timolol Mal PF 22.3-6.8 MG/ML SOLN Apply to eye.    . furosemide (LASIX) 40 MG tablet Take 40 mg by mouth. 1 tab am, 1/2 tab in pm    . hydrALAZINE  (APRESOLINE) 25 MG tablet Take 50 mg by mouth 2 (two) times daily.     . Iron, Ferrous Gluconate, 256 (28 FE) MG TABS Take 324 mg by mouth daily.     . metFORMIN (GLUCOPHAGE) 500 MG tablet Take 500 mg by mouth. One tablet twice daily    . pantoprazole (PROTONIX) 40 MG tablet Take 40 mg by mouth daily.    . potassium chloride (K-DUR) 10 MEQ tablet Take 20 mEq by mouth daily. 1 1/2 tablet daily,Take with lasix    . valsartan (DIOVAN) 160 MG tablet Take 160 mg by mouth daily.     Marland Kitchen warfarin (COUMADIN) 5 MG tablet Take 7.5 mg by mouth daily. Takes 2 tabs Mon, Wed, Fri. Takes 7.5mg  Tues, Thur, Sat, Sun.     No current facility-administered medications for this visit.     ROS: See HPI for pertinent positives and negatives.   Physical Examination  Vitals:   01/15/19 0942  BP: (!) 157/68  Pulse: (!) 53  Resp: 14  Temp: 97.7 F (36.5 C)  TempSrc: Temporal  SpO2: 98%  Weight: 222 lb (100.7 kg)  Height: 5\' 5"  (1.651 m)   Body mass index is 36.94 kg/m.  General: A&O x 3, WDWN, obese female in NAD, accompanied by her daughter. Gait: steady, holding her daughter's arm for guidance  HENT: No gross abnormalities.  Eyes: Right eye pupil is round and reactive to light,left pupil is irregular shaped, not reactive to light  Pulmonary: Respirations are non labored, CTAB, good air movement in all fields Cardiac: IrregularRhythm,bradycardic (not on a beta blocker),no detected murmur.     Carotid Bruits Right Left   Negative Negative   Radial pulses are 2+ palpable bilaterally   Adominal aortic pulse is not palpable                         VASCULAR EXAM: Extremities without ischemic changes, without Gangrene; without open wounds.  Right lower leg with1+pitting and 2+nonpitting edema, hemosiderin deposits in both lower legsindicating chronic venous insufficiency. Left lower leg with 1+ pitting edema.  LE Pulses Right Left       FEMORAL  2+ palpable  not palpable        POPLITEAL  not palpable   not palpable       POSTERIOR TIBIAL  not palpable   not palpable        DORSALIS PEDIS      ANTERIOR TIBIAL not palpable  not palpable    Abdomen: softly obese, NT, no palpable masses. Skin: no rashes, no cellulitis, no ulcers noted. Musculoskeletal: no muscle wasting or atrophy.  Neurologic: A&O X 3; appropriate affect, Sensation is normal; MOTOR FUNCTION:  moving all extremities equally, motor strength 5/5 throughout. Speech is fluent/normal. CN 2-12 intact except has significant hearing loss. Psychiatric: Thought content is normal, mood appropriate for clinical situation.    DATA  ABI Findings (01-15-19): +---------+------------------+-----+--------+--------+ Right    Rt Pressure (mmHg)IndexWaveformComment  +---------+------------------+-----+--------+--------+ Brachial 157                                     +---------+------------------+-----+--------+--------+ PTA      139               0.89 biphasic         +---------+------------------+-----+--------+--------+ DP       130               0.83 biphasic         +---------+------------------+-----+--------+--------+ Great Toe135               0.86                  +---------+------------------+-----+--------+--------+  +---------+------------------+-----+----------+-------+ Left     Lt Pressure (mmHg)IndexWaveform  Comment +---------+------------------+-----+----------+-------+ Brachial 156                                      +---------+------------------+-----+----------+-------+ PTA      148               0.94 monophasic        +---------+------------------+-----+----------+-------+ DP       121               0.77 biphasic          +---------+------------------+-----+----------+-------+ Great Toe97                0.62                    +---------+------------------+-----+----------+-------+  +-------+-----------+-----------+------------+------------+ ABI/TBIToday's ABIToday's TBIPrevious ABIPrevious TBI +-------+-----------+-----------+------------+------------+ Right  0.89       0.86       0.88        0.61         +-------+-----------+-----------+------------+------------+ Left   0.94       0.62       0.67        0.37         +-------+-----------+-----------+------------+------------+   Previous ABI on 12/09/17.   Summary: Right: Resting right ankle-brachial index indicates mild right lower extremity arterial disease. The right toe-brachial index is normal. RT great toe pressure = 135 mmHg.  Left: Resting left ankle-brachial index indicates mild left lower extremity arterial disease. The left toe-brachial index is abnormal. LT Great toe pressure = 97 mmHg.   ABI (Date: 11/29/2017):   R:  ? ABI: 0.88 (was 0.79 on 05-02-17),  ?  PT: bi ? DP: bi ? TBI:  0.61, toe pressure 120, (was 0.63)  L:  ? ABI: 0.67 (was 0.63),  ? PT: mono ? DP: mono ? TBI: 0.37, toe pressure 73, (was 0.54) Improved right ABI to mild disease, biphasic waveforms. Slightly improved left ABI, moderate disease, monophasic waveforms. Stable right TBI, decline in left TBI.   ASSESSMENT: DAVI ROTAN is a 73 y.o. female presents for evaluation of peripheral arterial disease. She was noted to have an abnormal non invasive arterial study on screening in July 2015. She is asymptomatic. She denies claudication, denies rest pain, she has no signs of ischemia in her lower extremites. She exercises and walks a great deal daily.  ABI's today show mild disease bilaterally, stable in the right with biphasic waveforms, significant improvement in the left with bi and monophasic waveforms.   Her atherosclerotic risk factors include well controlled DM and former smoker (quit in 2003). She takes coumadin for atrial fib.  I  congratulated her on her excellent lifestyle habits and encouraged her to continue   PLAN:  Based on the patient's vascular studies and examination, pt will return to clinic in 1 year with ABI's.  I advised her to notify us if she develops concerns re the circulation in her feet or legs.  Continue excellent daily exercise routine on stationary bike. Continue daily seated leg exercises as demonstrated and discussed with pt and her daughter.  I discussed in depth with the patient the nature of atherosclerosis, and emphasized the importance of maximal medical management including strict control of blood pressure, blood glucose, and lipid levels, obtaining regular exercise, and continued cessation of smoking.  The patient is aware that without maximal medical management the underlying atherosclerotic disease process will progress, limiting the benefit of any interventions.  The patient was given information about PAD including signs, symptoms, treatment, what symptoms should prompt the patient to seek immediate medical care, and risk reduction measures to take.  Clemon Chambers, RN, MSN, FNP-C [facility] Phone: [phone]  Clinic MD: Laqueta Due  01/15/19 9:47 AM

## 2019-01-15 NOTE — Patient Instructions (Signed)
Peripheral Vascular Disease  Peripheral vascular disease (PVD) is a disease of the blood vessels that are not part of your heart and brain. A simple term for PVD is poor circulation. In most cases, PVD narrows the blood vessels that carry blood from your heart to the rest of your body. This can reduce the supply of blood to your arms, legs, and internal organs, like your stomach or kidneys. However, PVD most often affects a persons lower legs and feet. Without treatment, PVD tends to get worse. PVD can also lead to acute ischemic limb. This is when an arm or leg suddenly cannot get enough blood. This is a medical emergency. Follow these instructions at home: Lifestyle  Do not use any products that contain nicotine or tobacco, such as cigarettes and e-cigarettes. If you need help quitting, ask your doctor.  Lose weight if you are overweight. Or, stay at a healthy weight as told by your doctor.  Eat a diet that is low in fat and cholesterol. If you need help, ask your doctor.  Exercise regularly. Ask your doctor for activities that are right for you. General instructions  Take over-the-counter and prescription medicines only as told by your doctor.  Take good care of your feet: ? Wear comfortable shoes that fit well. ? Check your feet often for any cuts or sores.  Keep all follow-up visits as told by your doctor This is important. Contact a doctor if:  You have cramps in your legs when you walk.  You have leg pain when you are at rest.  You have coldness in a leg or foot.  Your skin changes.  You are unable to get or have an erection (erectile dysfunction).  You have cuts or sores on your feet that do not heal. Get help right away if:  Your arm or leg turns cold, numb, and blue.  Your arms or legs become red, warm, swollen, painful, or numb.  You have chest pain.  You have trouble breathing.  You suddenly have weakness in your face, arm, or leg.  You become very  confused or you cannot speak.  You suddenly have a very bad headache.  You suddenly cannot see. Summary  Peripheral vascular disease (PVD) is a disease of the blood vessels.  A simple term for PVD is poor circulation. Without treatment, PVD tends to get worse.  Treatment may include exercise, low fat and low cholesterol diet, and quitting smoking. This information is not intended to replace advice given to you by your health care provider. Make sure you discuss any questions you have with your health care provider. Document Released: 05/09/2009 Document Revised: 01/25/2017 Document Reviewed: 03/22/2016 Elsevier Patient Education  2020 [employer].     Chronic Venous Insufficiency Chronic venous insufficiency is a condition where the leg veins cannot effectively pump blood from the legs to the heart. This happens when the vein walls are either stretched, weakened, or damaged, or when the valves inside the vein are damaged. With the right treatment, you should be able to continue with an active life. This condition is also called venous stasis. What are the causes? Common causes of this condition include:  High blood pressure inside the veins (venous hypertension).  Sitting or standing too long, causing increased blood pressure in the leg veins.  A blood clot that blocks blood flow in a vein (deep vein thrombosis, DVT).  Inflammation of a vein (phlebitis) that causes a blood clot to form.  Tumors in the pelvis  that cause blood to back up. What increases the risk? The following factors may make you more likely to develop this condition:  Having a family history of this condition.  Obesity.  Pregnancy.  Living without enough regular physical activity or exercise (sedentary lifestyle).  Smoking.  Having a job that requires long periods of standing or sitting in one place.  Being a certain age. Women in their 8s and 30s and men in their 3s are more likely to develop this  condition. What are the signs or symptoms? Symptoms of this condition include:  Veins that are enlarged, bulging, or twisted (varicose veins).  Skin breakdown or ulcers.  Reddened skin or dark discoloration of skin on the leg between the knee and ankle.  Brown, smooth, tight, and painful skin just above the ankle, usually on the inside of the leg (lipodermatosclerosis).  Swelling of the legs. How is this diagnosed? This condition may be diagnosed based on:  Your medical history.  A physical exam.  Tests, such as: ? A procedure that creates an image of a blood vessel and nearby organs and provides information about blood flow through the blood vessel (duplex ultrasound). ? A procedure that tests blood flow (plethysmography). ? A procedure that looks at the veins using X-ray and dye (venogram). How is this treated? The goals of treatment are to help you return to an active life and to minimize pain or disability. Treatment depends on the severity of your condition, and it may include:  Wearing compression stockings. These can help relieve symptoms and help prevent your condition from getting worse. However, they do not cure the condition.  Sclerotherapy. This procedure involves an injection of a solution that shrinks damaged veins.  Surgery. This may involve: ? Removing a diseased vein (vein stripping). ? Cutting off blood flow through the vein (laser ablation surgery). ? Repairing or reconstructing a valve within the affected vein. Follow these instructions at home:      Wear compression stockings as told by your health care provider. These stockings help to prevent blood clots and reduce swelling in your legs.  Take over-the-counter and prescription medicines only as told by your health care provider.  Stay active by exercising, walking, or doing different activities. Ask your health care provider what activities are safe for you and how much exercise you need.  Drink  enough fluid to keep your urine pale yellow.  Do not use any products that contain nicotine or tobacco, such as cigarettes, e-cigarettes, and chewing tobacco. If you need help quitting, ask your health care provider.  Keep all follow-up visits as told by your health care provider. This is important. Contact a health care provider if you:  Have redness, swelling, or more pain in the affected area.  See a red streak or line that goes up or down from the affected area.  Have skin breakdown or skin loss in the affected area, even if the breakdown is small.  Get an injury in the affected area. Get help right away if:  You get an injury and an open wound in the affected area.  You have: ? Severe pain that does not get better with medicine. ? Sudden numbness or weakness in the foot or ankle below the affected area. ? Trouble moving your foot or ankle. ? A fever. ? Worse or persistent symptoms. ? Chest pain. ? Shortness of breath. Summary  Chronic venous insufficiency is a condition where the leg veins cannot effectively pump blood from the  legs to the heart.  Chronic venous insufficiency occurs when the vein walls become stretched, weakened, or damaged, or when valves within the vein are damaged.  Treatment depends on how severe your condition is. It often involves wearing compression stockings and may involve having a procedure.  Make sure you stay active by exercising, walking, or doing different activities. Ask your health care provider what activities are safe for you and how much exercise you need. This information is not intended to replace advice given to you by your health care provider. Make sure you discuss any questions you have with your health care provider. Document Released: 06/18/2006 Document Revised: 11/05/2017 Document Reviewed: 11/05/2017 Elsevier Patient Education  2020 [employer].

## 2019-01-20 ENCOUNTER — Encounter: Payer: Self-pay | Admitting: Gastroenterology

## 2019-01-20 NOTE — Progress Notes (Signed)
Referring Provider:Dr. Woody Seller Primary Care Physician:  Glenda Chroman, MD Primary Gastroenterologist:  Dr. Gala Romney  Chief Complaint  Patient presents with  . Anemia    f/u.    HPI:   Bethany [name] is a 73 y.o. female presenting today at the request of Vyas, Dhruv B, MD for anemia.   Past medical history significant for IDA, stroke, HTN, HLD, atrial fibrillation on warfarin, PAD, CHF with last EF 45% in February 2020, COPD, diabetes, and GERD.  Patient is a limited historian. Today she states she has a history of anemia and has been on iron for years. Not sure what her baseline hemoglobin is or if her hemoglobin has been dropping. Not sure if she has blood in the stool or black stool due to significant vision impairment. Denies history of kidney disease although in chart review, her last creatinine in March 2020 was 1.52. Denies abdominal pain. BMs daily. No constipation or diarrhea. No family history of colon cancer. Not sure if she has ever had a colonoscopy. Records from PCP report colonoscopy in 2008 that was "normal."  Admits to acid reflux occasionally. Protonix keeps symptoms well controlled. No dysphagia. No nausea or vomiting. Has lost about 20 lbs since February. Has made dietary changes, decreased sodium intake significantly, and limited junk foods due to her CHF and diabetes. Appetite is still good.   No fever, chills, lightheadedness, dizziness, or feeling like she will pass out. No nasal congestion or sore throat. No chest pain or heart palpitations. Admits to occasional shortness of breath due to COPD. Completes breathing treatments as needed which helps. No regular cough.   No NSAIDs.   Past Medical History:  Diagnosis Date  . Anemia    on oral iron  . Atrial fibrillation ([place])   . CHF (congestive heart failure) ([place])   . COPD (chronic obstructive pulmonary disease) ([place])   . Diabetes mellitus without complication ([place])   . DVT (deep venous thrombosis) ([place])   . Enlarged  heart   . Gangrene ([place])   . GERD (gastroesophageal reflux disease)   . Hypercholesteremia   . Hypertension   . Insomnia   . Legally blind in left eye, as defined in [place]    Decreased vision in her right eye as well with limited ability to see.   . Stroke [facility])     Past Surgical History:  Procedure Laterality Date  . bilareral tubal ligation    . CATARACT EXTRACTION, BILATERAL    . EYE SURGERY    . FRACTURE SURGERY Right 2005   Fx  leg  . GLAUCOMA SURGERY Right 2014  . removal of sweat glands     per pt. 30 years ago  . TIBIA FRACTURE SURGERY Right 2010  . TUBAL LIGATION      Current Outpatient Medications  Medication Sig Dispense Refill  . albuterol (PROVENTIL HFA;VENTOLIN HFA) 108 (90 BASE) MCG/ACT inhaler Inhale 2 puffs into the lungs every 6 (six) hours as needed for wheezing or shortness of breath.    Marland Kitchen albuterol (PROVENTIL HFA;VENTOLIN HFA) 108 (90 BASE) MCG/ACT inhaler 2 puffs.    Marland Kitchen amLODipine (NORVASC) 2.5 MG tablet Take 2.5 mg by mouth daily.    Marland Kitchen atorvastatin (LIPITOR) 10 MG tablet Take 10 mg by mouth daily.    Marland Kitchen b complex vitamins capsule Take 1 capsule by mouth daily.    . Blood Glucose Monitoring Suppl (FORA V30A BLOOD GLUCOSE SYSTEM) DEVI by Does not apply route.    Marland Kitchen  brimonidine (ALPHAGAN) 0.2 % ophthalmic solution 3 (three) times daily.    . candesartan (ATACAND) 32 MG tablet Take 32 mg by mouth daily.  3  . cetirizine (ZYRTEC) 10 MG tablet Take 10 mg by mouth daily.    . cloNIDine (CATAPRES) 0.1 MG tablet Take 0.1 mg by mouth 2 (two) times daily. Reported on 09/09/2015    . diltiazem (CARDIZEM CD) 240 MG 24 hr capsule Take 300 mg by mouth daily.     . Dorzolamide HCl-Timolol Mal PF 22.3-6.8 MG/ML SOLN Apply to eye.    . furosemide (LASIX) 40 MG tablet Take 80 mg by mouth daily.     . hydrALAZINE (APRESOLINE) 25 MG tablet Take 50 mg by mouth 3 (three) times daily.     . Iron, Ferrous Gluconate, 256 (28 FE) MG TABS Take 324 mg by mouth daily.     . metFORMIN  (GLUCOPHAGE) 500 MG tablet 2 tab in am and 1 tab in PM    . pantoprazole (PROTONIX) 40 MG tablet Take 40 mg by mouth daily.    . potassium chloride (K-DUR) 10 MEQ tablet Take 20 mEq by mouth daily. 1 1/2 tablet daily,Take with lasix    . warfarin (COUMADIN) 5 MG tablet Take 7.5 mg by mouth daily. Takes 2 tabs Mon, Wed, Fri. Takes 7.5mg  Tues, Thur, Sat, Sun.     No current facility-administered medications for this visit.     Allergies as of 01/21/2019 - Review Complete 01/21/2019  Allergen Reaction Noted  . Sulfa antibiotics  10/21/2013  . Sulfamethoxazole Hives 09/09/2015  . Tape Other (See Comments) 10/21/2013    Family History  Problem Relation Age of Onset  . Heart disease Mother   . Hypertension Mother   . Heart attack Mother   . Hypertension Father   . Diabetes Daughter   . Colon cancer Neg Hx     Social History   Socioeconomic History  . Marital status: Divorced    Spouse name: Not on file  . Number of children: 3  . Years of education: 51 th   . Highest education level: Not on file  Occupational History  . Not on file  Social Needs  . Financial resource strain: Not on file  . Food insecurity    Worry: Not on file    Inability: Not on file  . Transportation needs    Medical: Not on file    Non-medical: Not on file  Tobacco Use  . Smoking status: Former Smoker    Quit date: 02/26/2001    Years since quitting: 17.9  . Smokeless tobacco: Never Used  Substance and Sexual Activity  . Alcohol use: No    Alcohol/week: 0.0 standard drinks  . Drug use: No  . Sexual activity: Not on file  Lifestyle  . Physical activity    Days per week: Not on file    Minutes per session: Not on file  . Stress: Not on file  Relationships  . Social Herbalist on phone: Not on file    Gets together: Not on file    Attends religious service: Not on file    Active member of club or organization: Not on file    Attends meetings of clubs or organizations: Not on file     Relationship status: Not on file  . Intimate partner violence    Fear of current or ex partner: Not on file    Emotionally abused: Not on file  Physically abused: Not on file    Forced sexual activity: Not on file  Other Topics Concern  . Not on file  Social History Narrative   Caffeine 1 cup daily.          Divorced. Home alone.  3 kids, (2 deceased).  11th grade.    Review of Systems: Gen: See HPI CV: See HPI  Resp: See HPI  GI: See HPI GU : Denies urinary burning, urinary frequency, urinary hesitancy MS: Denies joint pain Derm: Denies rash Psych: Denies depression, anxiety Heme: Denies bruising, bleeding  Physical Exam: BP 132/74   Pulse 69   Temp 97.9 F (36.6 C) (Oral)   Ht 5\' 7"  (1.702 m)   Wt 222 lb 6.4 oz (100.9 kg)   BMI 34.83 kg/m  General: Alert and oriented. Pleasant and cooperative. Well-nourished and well-developed. Significantly impaired vision requiring assistance to navigate from chair to exam table and back to chair.  Head:  Normocephalic and atraumatic. Eyes:  Without icterus, sclera clear and conjunctiva pink.  Ears:  Normal auditory acuity. Lungs:  Clear to auscultation bilaterally. No wheezes, rales, or rhonchi. No distress.  Heart:  S1, S2 present without murmurs appreciated.  Abdomen:  +BS, soft, non-tender and non-distended. No HSM noted. No guarding or rebound. No masses appreciated.  Rectal:  Deferred  Msk:  Symmetrical without gross deformities. Normal posture. Extremities:  Asymmetrical swelling in her ankles with right greater than the left. Per patient, this is chronic and secondary to prior fracture requiring surgery. No significant pitting edema.  Neurologic:  Alert and  oriented x4;  grossly normal neurologically. Skin:  Intact without significant lesions or rashes. Psych: Normal mood and affect.  Labs: 01/06/2019 INR 2.0 PT 24.5 (H) CBC: WBC 5.5, hemoglobin 9.7, MCV 94, MCH 31.0, MCHC 33.1, platelets 217 Iron panel: Iron 51,  iron saturation 19%, TIBC 267, U IBC 216, ferritin 64

## 2019-01-21 ENCOUNTER — Encounter: Payer: Self-pay | Admitting: Gastroenterology

## 2019-01-21 ENCOUNTER — Ambulatory Visit (INDEPENDENT_AMBULATORY_CARE_PROVIDER_SITE_OTHER): Payer: Medicare Other | Admitting: Gastroenterology

## 2019-01-21 ENCOUNTER — Other Ambulatory Visit: Payer: Self-pay

## 2019-01-21 DIAGNOSIS — D649 Anemia, unspecified: Secondary | ICD-10-CM

## 2019-01-21 NOTE — Patient Instructions (Signed)
Please have labs completed.   I will discuss your case further with Dr. Gala Romney and we will call you with further recommendation. Please call us back if you have no heard form [place] in a couple of weeks.   Aliene Altes, PA-C [facility]

## 2019-01-22 LAB — B12 AND FOLATE PANEL
Folate: >24 ng/mL
Vitamin B-12: 329 pg/mL (ref 200–1100)

## 2019-01-22 LAB — CREATININE, SERUM: Creat: 1.95 mg/dL — ABNORMAL HIGH (ref 0.60–0.93)

## 2019-01-24 ENCOUNTER — Encounter: Payer: Self-pay | Admitting: Gastroenterology

## 2019-01-24 NOTE — Assessment & Plan Note (Addendum)
73 year old female with past medical history significant for IDA on oral iron, stroke, blindness, HTN, HLD, atrial fibrillation on warfarin, PAD, CHF with last EF 45% in February 2020, COPD, diabetes, and GERD who presents at the request of Dr. Woody Seller for anemia.  Receive recent labs dated 01/06/2019 with hemoglobin 9.7, normocytic indices, ferritin 64, iron saturation 19%, iron 51, TIBC 267.  Patient reports she has been anemic and on iron for years. PCP note with ferrous gluconate ordered in 2015. Unaware of prior colonoscopy.  Per PCP records, colonoscopy normal in 2008.  No significant upper or lower GI symptoms.  Due to vision impairment, she is unable to tell me if she has bright red blood per rectum or melena.  Upon chart review, last creatinine 1.52 in March 2020.  Patient denies history of kidney disease.  No family history of colon cancer.  With no other recent records to review, it is unclear whether patient's hemoglobin is at baseline or is worsening.  With creatinine of 1.52 in March 2020, query whether chronic kidney disease may be playing a role in her anemia.  As it has been over 10 years since her last colonoscopy, she does need this updated.  With normal iron panel recently, would not jump to EGD for IDA as IDA is chronic, she is without any significant upper GI symptoms, and I would not want to subject this nice 73 year old lady to a procedure unnecessarily.  At this point, I will request labs from last year from her PCP to follow hemoglobin trend, update serum creatinine, and check B12 and folate.  Will also discuss with Dr. Gala Romney once records are received to determine TCS only vs TCS + EGD.   Further recommendations to follow.

## 2019-01-26 NOTE — Progress Notes (Signed)
B12 and folate normal.   Creatinine is high, 1.95, meaning her kidney function is low. Not sure if this is near her current baseline or something that is worsening. Last creatinine I see is in care everywhere of 1.52 in March 2020. I suspect worsening kidney function is likely contributing to her anemia. She needs to be sure she is drinking plenty of water to keep her urine pale yellow to clear. She also needs to call her PCP today to follow-up on this as I am not sure if her kidney function is acutely worsening or at baseline.  We should be in contact with her later this week regarding procedures to evaluate her anemia. I am waiting on labs from PCP to see her hemoglobin trend over the last year and then will discuss with Dr. Gala Romney.   Manuela Schwartz, can you send these results to patients PCP?

## 2019-02-04 ENCOUNTER — Telehealth: Payer: Self-pay | Admitting: Gastroenterology

## 2019-02-04 NOTE — Telephone Encounter (Signed)
Received and reviewed records from PCP.   August 2018: Hemoglobin 06 October 2017: Hemoglobin 12.28 September 2018: Hemoglobin 10.31 December 2018: Hemoglobin 9.02 February 2019: Hemoglobin 03.7  Elmo Putt, please let patient know that I have reviewed her records from her PCP.  Discussed case with Dr. Gala Romney.  She is overdue for colonoscopy.  Due to referral for anemia and mild worsening of her chronic anemia, he agrees that we should add possible upper endoscopy to her colonoscopy. Additionally, he recommends we screen her for celiac disease.  Please place orders for IgA and ttG IgA.   She is on Warfarin. We need to get the ok to hold her Warfarin 3 days prior to her procedure. Please reach out to her cardiologist for the ok to hold warfarin and inquire whether patient will need bridging therapy. Looks like she was last seen by Dr. Venida Jarvis with [facility] at [facility].    Also, patient has significant vision impairment. Can you ask if patient feels comfortable completing colon prep at home as she will be back and forth to the bathroom frequently.   Once we have this information, I will provide orders/instructions to [facility] clinical pool for scheduling.

## 2019-02-05 ENCOUNTER — Other Ambulatory Visit: Payer: Self-pay

## 2019-02-05 DIAGNOSIS — Z79899 Other long term (current) drug therapy: Secondary | ICD-10-CM

## 2019-02-05 DIAGNOSIS — D509 Iron deficiency anemia, unspecified: Secondary | ICD-10-CM

## 2019-02-05 NOTE — Telephone Encounter (Signed)
Noted. Lmom for [facility]. It looks like pt has been to more than one cardiology office. Waiting on a return call to see if pt still goes to this office. Letter will be mailed after confirmed.

## 2019-02-05 NOTE — Telephone Encounter (Signed)
Spoke with pt. Pt asked if we could call her daughter to discuss further. Spoke with pt's daughter. She is aware of K[facility] RMR recommendations. Lab orders have been faxed to [facility] and pt will have labs drawn tomorrow 02/06/2019.

## 2019-02-05 NOTE — Telephone Encounter (Signed)
Yes, we will need instructions from cardiology regarding Warfarin. Will likely need bridging. Elmo Putt, if you have not already sent the letter to cardiology, will you please include the information provided by patients daughter?

## 2019-02-05 NOTE — Telephone Encounter (Signed)
Pt's daughter wanted to mention that the last time her mother held her Coumadin for a few days prior to a procedure she had a stroke. Pt's daughter says she has to have a shot now when her Coumadin is d/c for 3 days. Will contact pt's cardiologist as directed.

## 2019-02-09 NOTE — Telephone Encounter (Signed)
Received a call back from Jud at [facility]. The letter was faxed to Darlene's attention at [phone]. Waiting on a response from [place].

## 2019-02-10 ENCOUNTER — Other Ambulatory Visit: Payer: Self-pay

## 2019-02-10 DIAGNOSIS — I779 Disorder of arteries and arterioles, unspecified: Secondary | ICD-10-CM

## 2019-02-11 ENCOUNTER — Telehealth: Payer: Self-pay | Admitting: Gastroenterology

## 2019-02-11 NOTE — Telephone Encounter (Signed)
Received and reviewed celiac serologies from [facility] dated 02/09/2019.  TTG IgA <2 (negative 0-3) IgA total 160 (normal 64--422)  No evidence of celiac disease.  Elmo Putt please let patient know she does not have celiac disease.

## 2019-02-12 NOTE — Telephone Encounter (Signed)
I think cardiology pre-op evaluation would be fine and I would appreciate their input! Would like for her to follow-up with them ASAP as she is needing procedures for worsening anemia over the last year. Would they be able to see her in the next couple of weeks?

## 2019-02-12 NOTE — Telephone Encounter (Signed)
Pt's daughter notified of results.  

## 2019-02-12 NOTE — Telephone Encounter (Signed)
Received a call and fax from [facility] from [facility]. In reference to pt having a TCS and EGD, Dr. Tommi Rumps would like pt to follow up for a pre-op evaluate unless the procedure is urgent. Please advise.

## 2019-02-16 NOTE — Telephone Encounter (Signed)
Noted. If we do not hear back from patient in the next week, can we place reminder to follow-up on whether she has an appointment scheduled with cardiology?

## 2019-02-16 NOTE — Telephone Encounter (Signed)
Routed to [place] as a future reminder.

## 2019-02-16 NOTE — Telephone Encounter (Signed)
Called pt and informed her that we need to try to get her a procedure scheduled in the near future due to her worsening anemia. Pt advised to see if she could get a follow up with her cardiologist in the next couple of weeks.  Pt had me to repeat this information to her daughter.  Her daughter said that she will call her mother's cardiologist.

## 2019-02-17 NOTE — Telephone Encounter (Signed)
I poke with the patient and she has an appt with her cardiologist 12/29 at 3 pm

## 2019-02-24 ENCOUNTER — Telehealth: Payer: Self-pay

## 2019-02-24 NOTE — Telephone Encounter (Signed)
Pt was seen in office by Aliene Altes, [place], can you call Dr. Denman George?

## 2019-02-24 NOTE — Telephone Encounter (Signed)
T/C from Dr. Howard Pouch in reference to pt on Warfarin and having procedures. He would like to discuss with Dr. Oneida Alar about the pt having a Watchman procedure prior to having GI procedures. He can be reached at [phone].

## 2019-02-24 NOTE — Telephone Encounter (Signed)
PT IS AN RMR PT.

## 2019-02-26 NOTE — Telephone Encounter (Signed)
Spoke with Dr. Denman George, cardiologist at [facility]. He states patient is at high risk to come off of anticoagulation for procedures and would like for her to undergo Watchman procedure in the next couple of weeks. She would need to be on anticoagulation for an additional 6 weeks, but after this, we could safely discontinue anticoagulation. As her hemoglobin has been down trending very slowly over the last year and actually seems to have been stable over the last 4 months, I feel procedures are not urgent and having Watchman performed prior would be appropriate.   If patient is agreeable, we can go ahead and hold a date for a colonoscopy +/- EGD as first available isn't until the later part of March. This should give time to have the Watchman completed with additional 6 weeks of anticoagulation.   I would like for her to let us know when she hears from cardiology and when the Watchman will be scheduled so we can adjust the timing of procedures if needed.   Dr. Gala Romney, [facility]

## 2019-02-26 NOTE — Telephone Encounter (Addendum)
Holding spot for procedure 06/18/19 at 7:30am. Called and informed daughter. OV scheduled for 05/25/19 at 1:30pm.

## 2019-02-26 NOTE — Telephone Encounter (Signed)
Noted. Spoke with pts daughter and the Watchman procedure is scheduled on 04/08/2019. Pt's daughter is in agreement with scheduling procedure/ holding a spot for pt. Please advise on when procedure can be scheduled since pt isn't having the procedure until 04/08/2019.

## 2019-02-26 NOTE — Telephone Encounter (Signed)
[facility] Clinical Pool: Please hold spot for TCS +/- EGD with propofol with RMR in mid to late April if available. I will follow up with her in office prior to officially scheduling procedures.  I will provide medication adjustments at that time.  Of note, she is a diabetic.   Alicia: Please let patient know we will try to hold a date in April. I would like to see patient in clinic in March to see how she is doing post watchman procedure prior to officially scheduling TCS/EGD.

## 2019-02-26 NOTE — Telephone Encounter (Signed)
Noted. Spoke with pts daughter. She is aware that a slot will be held for April 2021. Please contact pts daughter when scheduling procedure.

## 2019-03-02 DIAGNOSIS — H547 Unspecified visual loss: Secondary | ICD-10-CM | POA: Diagnosis not present

## 2019-03-02 DIAGNOSIS — H401133 Primary open-angle glaucoma, bilateral, severe stage: Secondary | ICD-10-CM | POA: Diagnosis not present

## 2019-03-03 DIAGNOSIS — I10 Essential (primary) hypertension: Secondary | ICD-10-CM | POA: Diagnosis not present

## 2019-03-03 DIAGNOSIS — E1165 Type 2 diabetes mellitus with hyperglycemia: Secondary | ICD-10-CM | POA: Diagnosis not present

## 2019-03-03 DIAGNOSIS — N10 Acute tubulo-interstitial nephritis: Secondary | ICD-10-CM | POA: Diagnosis not present

## 2019-03-03 DIAGNOSIS — Z299 Encounter for prophylactic measures, unspecified: Secondary | ICD-10-CM | POA: Diagnosis not present

## 2019-03-03 DIAGNOSIS — E1142 Type 2 diabetes mellitus with diabetic polyneuropathy: Secondary | ICD-10-CM | POA: Diagnosis not present

## 2019-03-09 DIAGNOSIS — I110 Hypertensive heart disease with heart failure: Secondary | ICD-10-CM | POA: Diagnosis not present

## 2019-03-16 DIAGNOSIS — I509 Heart failure, unspecified: Secondary | ICD-10-CM | POA: Diagnosis not present

## 2019-03-16 DIAGNOSIS — I10 Essential (primary) hypertension: Secondary | ICD-10-CM | POA: Diagnosis not present

## 2019-03-16 DIAGNOSIS — I4891 Unspecified atrial fibrillation: Secondary | ICD-10-CM | POA: Diagnosis not present

## 2019-03-16 DIAGNOSIS — J449 Chronic obstructive pulmonary disease, unspecified: Secondary | ICD-10-CM | POA: Diagnosis not present

## 2019-03-16 DIAGNOSIS — Z87891 Personal history of nicotine dependence: Secondary | ICD-10-CM | POA: Diagnosis not present

## 2019-03-16 DIAGNOSIS — Z299 Encounter for prophylactic measures, unspecified: Secondary | ICD-10-CM | POA: Diagnosis not present

## 2019-03-16 DIAGNOSIS — I739 Peripheral vascular disease, unspecified: Secondary | ICD-10-CM | POA: Diagnosis not present

## 2019-03-26 DIAGNOSIS — I509 Heart failure, unspecified: Secondary | ICD-10-CM | POA: Diagnosis not present

## 2019-03-26 DIAGNOSIS — I10 Essential (primary) hypertension: Secondary | ICD-10-CM | POA: Diagnosis not present

## 2019-03-26 DIAGNOSIS — E119 Type 2 diabetes mellitus without complications: Secondary | ICD-10-CM | POA: Diagnosis not present

## 2019-03-26 DIAGNOSIS — M159 Polyosteoarthritis, unspecified: Secondary | ICD-10-CM | POA: Diagnosis not present

## 2019-03-29 DIAGNOSIS — J449 Chronic obstructive pulmonary disease, unspecified: Secondary | ICD-10-CM | POA: Diagnosis not present

## 2019-04-08 DIAGNOSIS — I5042 Chronic combined systolic (congestive) and diastolic (congestive) heart failure: Secondary | ICD-10-CM | POA: Diagnosis not present

## 2019-04-08 DIAGNOSIS — I130 Hypertensive heart and chronic kidney disease with heart failure and stage 1 through stage 4 chronic kidney disease, or unspecified chronic kidney disease: Secondary | ICD-10-CM | POA: Diagnosis not present

## 2019-04-08 DIAGNOSIS — T45515A Adverse effect of anticoagulants, initial encounter: Secondary | ICD-10-CM | POA: Diagnosis not present

## 2019-04-08 DIAGNOSIS — K922 Gastrointestinal hemorrhage, unspecified: Secondary | ICD-10-CM | POA: Diagnosis not present

## 2019-04-08 DIAGNOSIS — Z882 Allergy status to sulfonamides status: Secondary | ICD-10-CM | POA: Diagnosis not present

## 2019-04-08 DIAGNOSIS — Z79899 Other long term (current) drug therapy: Secondary | ICD-10-CM | POA: Diagnosis not present

## 2019-04-08 DIAGNOSIS — Z95818 Presence of other cardiac implants and grafts: Secondary | ICD-10-CM | POA: Diagnosis not present

## 2019-04-08 DIAGNOSIS — Z006 Encounter for examination for normal comparison and control in clinical research program: Secondary | ICD-10-CM | POA: Diagnosis not present

## 2019-04-08 DIAGNOSIS — J449 Chronic obstructive pulmonary disease, unspecified: Secondary | ICD-10-CM | POA: Diagnosis not present

## 2019-04-08 DIAGNOSIS — Z7984 Long term (current) use of oral hypoglycemic drugs: Secondary | ICD-10-CM | POA: Diagnosis not present

## 2019-04-08 DIAGNOSIS — I96 Gangrene, not elsewhere classified: Secondary | ICD-10-CM | POA: Diagnosis not present

## 2019-04-08 DIAGNOSIS — I4819 Other persistent atrial fibrillation: Secondary | ICD-10-CM | POA: Diagnosis not present

## 2019-04-08 DIAGNOSIS — D126 Benign neoplasm of colon, unspecified: Secondary | ICD-10-CM | POA: Diagnosis not present

## 2019-04-08 DIAGNOSIS — E1122 Type 2 diabetes mellitus with diabetic chronic kidney disease: Secondary | ICD-10-CM | POA: Diagnosis not present

## 2019-04-08 DIAGNOSIS — D631 Anemia in chronic kidney disease: Secondary | ICD-10-CM | POA: Diagnosis not present

## 2019-04-08 DIAGNOSIS — E1151 Type 2 diabetes mellitus with diabetic peripheral angiopathy without gangrene: Secondary | ICD-10-CM | POA: Diagnosis not present

## 2019-04-08 DIAGNOSIS — N183 Chronic kidney disease, stage 3 unspecified: Secondary | ICD-10-CM | POA: Diagnosis not present

## 2019-04-08 DIAGNOSIS — I4811 Longstanding persistent atrial fibrillation: Secondary | ICD-10-CM | POA: Diagnosis not present

## 2019-04-08 DIAGNOSIS — Z8673 Personal history of transient ischemic attack (TIA), and cerebral infarction without residual deficits: Secondary | ICD-10-CM | POA: Diagnosis not present

## 2019-04-08 DIAGNOSIS — I4891 Unspecified atrial fibrillation: Secondary | ICD-10-CM | POA: Diagnosis not present

## 2019-04-08 DIAGNOSIS — H9190 Unspecified hearing loss, unspecified ear: Secondary | ICD-10-CM | POA: Diagnosis not present

## 2019-04-13 DIAGNOSIS — N184 Chronic kidney disease, stage 4 (severe): Secondary | ICD-10-CM | POA: Diagnosis not present

## 2019-04-13 DIAGNOSIS — Z299 Encounter for prophylactic measures, unspecified: Secondary | ICD-10-CM | POA: Diagnosis not present

## 2019-04-13 DIAGNOSIS — E1165 Type 2 diabetes mellitus with hyperglycemia: Secondary | ICD-10-CM | POA: Diagnosis not present

## 2019-04-13 DIAGNOSIS — I10 Essential (primary) hypertension: Secondary | ICD-10-CM | POA: Diagnosis not present

## 2019-04-13 DIAGNOSIS — E1142 Type 2 diabetes mellitus with diabetic polyneuropathy: Secondary | ICD-10-CM | POA: Diagnosis not present

## 2019-04-13 DIAGNOSIS — Z09 Encounter for follow-up examination after completed treatment for conditions other than malignant neoplasm: Secondary | ICD-10-CM | POA: Diagnosis not present

## 2019-04-13 DIAGNOSIS — I4891 Unspecified atrial fibrillation: Secondary | ICD-10-CM | POA: Diagnosis not present

## 2019-04-23 DIAGNOSIS — E119 Type 2 diabetes mellitus without complications: Secondary | ICD-10-CM | POA: Diagnosis not present

## 2019-04-23 DIAGNOSIS — I10 Essential (primary) hypertension: Secondary | ICD-10-CM | POA: Diagnosis not present

## 2019-04-23 DIAGNOSIS — M159 Polyosteoarthritis, unspecified: Secondary | ICD-10-CM | POA: Diagnosis not present

## 2019-04-23 DIAGNOSIS — I509 Heart failure, unspecified: Secondary | ICD-10-CM | POA: Diagnosis not present

## 2019-04-28 ENCOUNTER — Encounter: Payer: Self-pay | Admitting: Ophthalmology

## 2019-04-28 DIAGNOSIS — H401133 Primary open-angle glaucoma, bilateral, severe stage: Secondary | ICD-10-CM | POA: Diagnosis not present

## 2019-04-28 DIAGNOSIS — H353132 Nonexudative age-related macular degeneration, bilateral, intermediate dry stage: Secondary | ICD-10-CM | POA: Diagnosis not present

## 2019-04-28 DIAGNOSIS — H524 Presbyopia: Secondary | ICD-10-CM | POA: Diagnosis not present

## 2019-04-28 DIAGNOSIS — E119 Type 2 diabetes mellitus without complications: Secondary | ICD-10-CM | POA: Diagnosis not present

## 2019-04-28 DIAGNOSIS — Z961 Presence of intraocular lens: Secondary | ICD-10-CM | POA: Diagnosis not present

## 2019-05-11 DIAGNOSIS — J449 Chronic obstructive pulmonary disease, unspecified: Secondary | ICD-10-CM | POA: Diagnosis not present

## 2019-05-18 DIAGNOSIS — R809 Proteinuria, unspecified: Secondary | ICD-10-CM | POA: Diagnosis not present

## 2019-05-18 DIAGNOSIS — D631 Anemia in chronic kidney disease: Secondary | ICD-10-CM | POA: Diagnosis not present

## 2019-05-18 DIAGNOSIS — N1832 Chronic kidney disease, stage 3b: Secondary | ICD-10-CM | POA: Diagnosis not present

## 2019-05-18 DIAGNOSIS — E1122 Type 2 diabetes mellitus with diabetic chronic kidney disease: Secondary | ICD-10-CM | POA: Diagnosis not present

## 2019-05-18 DIAGNOSIS — I129 Hypertensive chronic kidney disease with stage 1 through stage 4 chronic kidney disease, or unspecified chronic kidney disease: Secondary | ICD-10-CM | POA: Diagnosis not present

## 2019-05-18 DIAGNOSIS — N189 Chronic kidney disease, unspecified: Secondary | ICD-10-CM | POA: Diagnosis not present

## 2019-05-18 DIAGNOSIS — N179 Acute kidney failure, unspecified: Secondary | ICD-10-CM | POA: Diagnosis not present

## 2019-05-18 DIAGNOSIS — N2581 Secondary hyperparathyroidism of renal origin: Secondary | ICD-10-CM | POA: Diagnosis not present

## 2019-05-19 ENCOUNTER — Other Ambulatory Visit: Payer: Self-pay | Admitting: Nephrology

## 2019-05-19 DIAGNOSIS — N1832 Chronic kidney disease, stage 3b: Secondary | ICD-10-CM

## 2019-05-21 DIAGNOSIS — Z7982 Long term (current) use of aspirin: Secondary | ICD-10-CM | POA: Diagnosis not present

## 2019-05-21 DIAGNOSIS — Z79899 Other long term (current) drug therapy: Secondary | ICD-10-CM | POA: Diagnosis not present

## 2019-05-21 DIAGNOSIS — Q211 Atrial septal defect: Secondary | ICD-10-CM | POA: Diagnosis not present

## 2019-05-21 DIAGNOSIS — I5023 Acute on chronic systolic (congestive) heart failure: Secondary | ICD-10-CM | POA: Diagnosis not present

## 2019-05-21 DIAGNOSIS — Z95818 Presence of other cardiac implants and grafts: Secondary | ICD-10-CM | POA: Diagnosis not present

## 2019-05-21 DIAGNOSIS — I10 Essential (primary) hypertension: Secondary | ICD-10-CM | POA: Diagnosis not present

## 2019-05-21 DIAGNOSIS — Z7902 Long term (current) use of antithrombotics/antiplatelets: Secondary | ICD-10-CM | POA: Diagnosis not present

## 2019-05-21 DIAGNOSIS — I110 Hypertensive heart disease with heart failure: Secondary | ICD-10-CM | POA: Diagnosis not present

## 2019-05-21 DIAGNOSIS — I4811 Longstanding persistent atrial fibrillation: Secondary | ICD-10-CM | POA: Diagnosis not present

## 2019-05-22 ENCOUNTER — Ambulatory Visit (HOSPITAL_COMMUNITY)
Admission: RE | Admit: 2019-05-22 | Discharge: 2019-05-22 | Disposition: A | Discharge status: Home / Self Care | Payer: Medicare Other | Source: Ambulatory Visit | Attending: Nephrology | Admitting: Nephrology

## 2019-05-22 ENCOUNTER — Other Ambulatory Visit: Payer: Self-pay

## 2019-05-22 DIAGNOSIS — N1832 Chronic kidney disease, stage 3b: Secondary | ICD-10-CM | POA: Diagnosis not present

## 2019-05-22 DIAGNOSIS — N183 Chronic kidney disease, stage 3 unspecified: Secondary | ICD-10-CM | POA: Diagnosis not present

## 2019-05-25 ENCOUNTER — Ambulatory Visit: Payer: Medicare Other | Admitting: Gastroenterology

## 2019-05-25 DIAGNOSIS — E1165 Type 2 diabetes mellitus with hyperglycemia: Secondary | ICD-10-CM | POA: Diagnosis not present

## 2019-05-25 DIAGNOSIS — I4891 Unspecified atrial fibrillation: Secondary | ICD-10-CM | POA: Diagnosis not present

## 2019-05-25 DIAGNOSIS — I6340 Cerebral infarction due to embolism of unspecified cerebral artery: Secondary | ICD-10-CM | POA: Diagnosis not present

## 2019-05-25 DIAGNOSIS — Z299 Encounter for prophylactic measures, unspecified: Secondary | ICD-10-CM | POA: Diagnosis not present

## 2019-05-25 NOTE — Telephone Encounter (Signed)
Pt cancelled OV (wants to wait until she is off blood thinners). Hold spot removed from procedure schedule.

## 2019-05-27 DIAGNOSIS — M159 Polyosteoarthritis, unspecified: Secondary | ICD-10-CM | POA: Diagnosis not present

## 2019-05-27 DIAGNOSIS — I509 Heart failure, unspecified: Secondary | ICD-10-CM | POA: Diagnosis not present

## 2019-05-27 DIAGNOSIS — E119 Type 2 diabetes mellitus without complications: Secondary | ICD-10-CM | POA: Diagnosis not present

## 2019-05-27 DIAGNOSIS — I10 Essential (primary) hypertension: Secondary | ICD-10-CM | POA: Diagnosis not present

## 2019-05-28 DIAGNOSIS — J449 Chronic obstructive pulmonary disease, unspecified: Secondary | ICD-10-CM | POA: Diagnosis not present

## 2019-06-01 DIAGNOSIS — Z299 Encounter for prophylactic measures, unspecified: Secondary | ICD-10-CM | POA: Diagnosis not present

## 2019-06-01 DIAGNOSIS — Z1211 Encounter for screening for malignant neoplasm of colon: Secondary | ICD-10-CM | POA: Diagnosis not present

## 2019-06-01 DIAGNOSIS — E1122 Type 2 diabetes mellitus with diabetic chronic kidney disease: Secondary | ICD-10-CM | POA: Diagnosis not present

## 2019-06-01 DIAGNOSIS — I10 Essential (primary) hypertension: Secondary | ICD-10-CM | POA: Diagnosis not present

## 2019-06-01 DIAGNOSIS — Z7189 Other specified counseling: Secondary | ICD-10-CM | POA: Diagnosis not present

## 2019-06-01 DIAGNOSIS — Z0000 Encounter for general adult medical examination without abnormal findings: Secondary | ICD-10-CM | POA: Diagnosis not present

## 2019-06-03 DIAGNOSIS — Z79899 Other long term (current) drug therapy: Secondary | ICD-10-CM | POA: Diagnosis not present

## 2019-06-03 DIAGNOSIS — E7800 Pure hypercholesterolemia, unspecified: Secondary | ICD-10-CM | POA: Diagnosis not present

## 2019-06-03 DIAGNOSIS — R5383 Other fatigue: Secondary | ICD-10-CM | POA: Diagnosis not present

## 2019-06-11 DIAGNOSIS — J449 Chronic obstructive pulmonary disease, unspecified: Secondary | ICD-10-CM | POA: Diagnosis not present

## 2019-06-16 ENCOUNTER — Encounter (INDEPENDENT_AMBULATORY_CARE_PROVIDER_SITE_OTHER): Payer: Self-pay | Admitting: Ophthalmology

## 2019-06-16 ENCOUNTER — Ambulatory Visit (INDEPENDENT_AMBULATORY_CARE_PROVIDER_SITE_OTHER): Payer: Medicare Other | Admitting: Ophthalmology

## 2019-06-16 ENCOUNTER — Other Ambulatory Visit: Payer: Self-pay

## 2019-06-16 DIAGNOSIS — E113393 Type 2 diabetes mellitus with moderate nonproliferative diabetic retinopathy without macular edema, bilateral: Secondary | ICD-10-CM | POA: Diagnosis not present

## 2019-06-16 DIAGNOSIS — H35459 Secondary pigmentary degeneration, unspecified eye: Secondary | ICD-10-CM | POA: Diagnosis not present

## 2019-06-16 DIAGNOSIS — H3562 Retinal hemorrhage, left eye: Secondary | ICD-10-CM | POA: Diagnosis not present

## 2019-06-16 DIAGNOSIS — H3554 Dystrophies primarily involving the retinal pigment epithelium: Secondary | ICD-10-CM

## 2019-06-16 DIAGNOSIS — H401133 Primary open-angle glaucoma, bilateral, severe stage: Secondary | ICD-10-CM

## 2019-06-16 DIAGNOSIS — H44512 Absolute glaucoma, left eye: Secondary | ICD-10-CM

## 2019-06-16 NOTE — Progress Notes (Signed)
06/16/2019     CHIEF COMPLAINT Patient presents for Retina Follow Up   HISTORY OF PRESENT ILLNESS: Bethany [name] is a 74 y.o. female who presents to the clinic today for:   HPI    Retina Follow Up    Patient presents with  Diabetic Retinopathy.  In both eyes.  Duration of 9 months.  Since onset it is stable.          Comments    9 month follow up - OCT OU Patient denies change in vision and overall has no complaints. LBS 112 this AM A1C 6.0 04/2019       Last edited by Gerda Diss on 06/16/2019  9:55 AM. (History)      Referring physician: Glenda Chroman, MD [place],  [place] [address]  HISTORICAL INFORMATION:   Selected notes from the MEDICAL RECORD NUMBER       CURRENT MEDICATIONS: Current Outpatient Medications (Ophthalmic Drugs)  Medication Sig  . brimonidine (ALPHAGAN) 0.2 % ophthalmic solution 3 (three) times daily.  . Dorzolamide HCl-Timolol Mal PF 22.3-6.8 MG/ML SOLN Apply to eye.   No current facility-administered medications for this visit. (Ophthalmic Drugs)   Current Outpatient Medications (Other)  Medication Sig  . albuterol (PROVENTIL HFA;VENTOLIN HFA) 108 (90 BASE) MCG/ACT inhaler 2 puffs.  Marland Kitchen amLODipine (NORVASC) 2.5 MG tablet Take 2.5 mg by mouth daily.  Marland Kitchen atorvastatin (LIPITOR) 10 MG tablet Take 10 mg by mouth daily.  Marland Kitchen b complex vitamins capsule Take 1 capsule by mouth daily.  . Blood Glucose Monitoring Suppl (FORA V30A BLOOD GLUCOSE SYSTEM) DEVI by Does not apply route.  . candesartan (ATACAND) 32 MG tablet Take 32 mg by mouth daily.  . cetirizine (ZYRTEC) 10 MG tablet Take 10 mg by mouth daily.  . cloNIDine (CATAPRES) 0.1 MG tablet Take 0.1 mg by mouth 2 (two) times daily. Reported on 09/09/2015  . clopidogrel (PLAVIX) 300 MG TABS tablet Take 300 mg by mouth once.  . furosemide (LASIX) 40 MG tablet Take 80 mg by mouth daily.   . hydrALAZINE (APRESOLINE) 25 MG tablet Take 50 mg by mouth 3 (three) times daily.   . Iron, Ferrous  Gluconate, 256 (28 FE) MG TABS Take 324 mg by mouth daily.   . metFORMIN (GLUCOPHAGE) 500 MG tablet 2 tab in am and 1 tab in PM  . potassium chloride (K-DUR) 10 MEQ tablet Take 20 mEq by mouth daily. 1 1/2 tablet daily,Take with lasix  . albuterol (PROVENTIL HFA;VENTOLIN HFA) 108 (90 BASE) MCG/ACT inhaler Inhale 2 puffs into the lungs every 6 (six) hours as needed for wheezing or shortness of breath.  . diltiazem (CARDIZEM CD) 240 MG 24 hr capsule Take 300 mg by mouth daily.   . pantoprazole (PROTONIX) 40 MG tablet Take 40 mg by mouth daily.  Marland Kitchen warfarin (COUMADIN) 5 MG tablet Take 7.5 mg by mouth daily. Takes 2 tabs Mon, Wed, Fri. Takes 7.5mg  Harrell Lark, Sat, Sun.   No current facility-administered medications for this visit. (Other)      REVIEW OF SYSTEMS:    ALLERGIES Allergies  Allergen Reactions  . Sulfa Antibiotics     Swelling, hives  . Sulfamethoxazole Hives  . Tape Other (See Comments)    Pulls skin off. Skin irritation and breakdown    PAST MEDICAL HISTORY Past Medical History:  Diagnosis Date  . Anemia    on oral iron  . Atrial fibrillation ([place])   . CHF (congestive heart failure) ([place])   .  COPD (chronic obstructive pulmonary disease) ([place])   . Diabetes mellitus without complication ([place])   . DVT (deep venous thrombosis) ([place])   . Enlarged heart   . Gangrene ([place])   . GERD (gastroesophageal reflux disease)   . Hypercholesteremia   . Hypertension   . Insomnia   . Legally blind in left eye, as defined in [place]    Decreased vision in her right eye as well with limited ability to see.   . Stroke [facility])    Past Surgical History:  Procedure Laterality Date  . bilareral tubal ligation    . CATARACT EXTRACTION, BILATERAL    . EYE SURGERY    . FRACTURE SURGERY Right 2005   Fx  leg  . GLAUCOMA SURGERY Right 2014  . removal of sweat glands     per pt. 30 years ago  . TIBIA FRACTURE SURGERY Right 2010  . TUBAL LIGATION      FAMILY HISTORY Family History    Problem Relation Age of Onset  . Heart disease Mother   . Hypertension Mother   . Heart attack Mother   . Hypertension Father   . Diabetes Daughter   . Colon cancer Neg Hx     SOCIAL HISTORY Social History   Tobacco Use  . Smoking status: Former Smoker    Quit date: 02/26/2001    Years since quitting: 18.3  . Smokeless tobacco: Never Used  Substance Use Topics  . Alcohol use: No    Alcohol/week: 0.0 standard drinks  . Drug use: No         OPHTHALMIC EXAM: Base Eye Exam    Visual Acuity (Snellen - Linear)      Right Left   Dist [place] 20/100-2 NLP   Dist ph [place] NI        Tonometry (Tonopen, 10:10 AM)      Right Left   Pressure 14 11       Pupils      Dark Light Shape React APD   Right 6 4 Irregular Brisk None   Left 6 6 Round Minimal +3       Visual Fields (Counting fingers)      Left Right   Restrictions Total superior temporal, inferior temporal, superior nasal, inferior nasal deficiencies Total superior temporal, inferior temporal, superior nasal, inferior nasal deficiencies       Extraocular Movement   Poor cooperation and understanding       Neuro/Psych    Oriented x3: Yes   Mood/Affect: Normal       Dilation    Both eyes: 1.0% Mydriacyl, 2.5% Phenylephrine @ 10:10 AM        Slit Lamp and Fundus Exam    External Exam      Right Left   External Normal Normal       Slit Lamp Exam      Right Left   Lids/Lashes Normal Normal   Conjunctiva/Sclera White and quiet White and quiet, pale bleb SN   Cornea Clear Clear   Anterior Chamber Deep and quiet Deep and quiet   Iris Round and reactive Round and reactive   Lens Posterior chamber intraocular lens Posterior chamber intraocular lens   Anterior Vitreous Normal Normal       Fundus Exam      Right Left   Posterior Vitreous Posterior vitreous detachment    Disc Some pallor Completely cupped   C/D Ratio 0.85 1.0   Macula Microaneurysms  IMAGING AND PROCEDURES  Imaging and  Procedures for 06/16/19  OCT, Retina - OU - Both Eyes       Right Eye Quality was good. Progression has been stable. Findings include abnormal foveal contour, outer retinal atrophy, central retinal atrophy, inner retinal atrophy.   Left Eye Quality was good. Scan locations included subfoveal. Progression has been stable. Findings include central retinal atrophy, outer retinal atrophy, inner retinal atrophy, abnormal foveal contour.   Notes OD with diffuse retinal atrophy.  No active macular edema.,  OS similarly with near complete loss of the retinal nerve fiber layer.  Advanced glaucoma.                ASSESSMENT/PLAN:  No problem-specific Assessment & Plan notes found for this encounter.      ICD-10-CM   1. Moderate nonproliferative diabetic retinopathy of both eyes without macular edema associated with type 2 diabetes mellitus (HCC)  P38.2505 OCT, Retina - OU - Both Eyes  2. Primary open angle glaucoma of both eyes, severe stage  H40.1133   3. Dystrophies primarily involving the retinal pigment epithelium  H35.54   4. Retinal hemorrhage of left eye  H35.62 OCT, Retina - OU - Both Eyes  5. Secondary pigmentary degeneration of peripheral retina  H35.459 OCT, Retina - OU - Both Eyes    1.  2.  3.  Ophthalmic Meds Ordered this visit:  No orders of the defined types were placed in this encounter.      No follow-ups on file.  There are no Patient Instructions on file for this visit.   Explained the diagnoses, plan, and follow up with the patient and they expressed understanding.  Patient expressed understanding of the importance of proper follow up care.   Clent Demark Aileena Iglesia M.D. Diseases & Surgery of the Retina and Vitreous Retina & Diabetic [place] 06/16/19     Abbreviations: M myopia (nearsighted); A astigmatism; H hyperopia (farsighted); P presbyopia; Mrx spectacle prescription;  CTL contact lenses; OD right eye; OS left eye; OU both eyes  XT exotropia;  ET esotropia; PEK punctate epithelial keratitis; PEE punctate epithelial erosions; DES dry eye syndrome; MGD meibomian gland dysfunction; ATs artificial tears; PFAT's preservative free artificial tears; [place] nuclear sclerotic cataract; PSC posterior subcapsular cataract; ERM epi-retinal membrane; PVD posterior vitreous detachment; RD retinal detachment; DM diabetes mellitus; DR diabetic retinopathy; NPDR non-proliferative diabetic retinopathy; PDR proliferative diabetic retinopathy; CSME clinically significant macular edema; DME diabetic macular edema; dbh dot blot hemorrhages; CWS cotton wool spot; POAG primary open angle glaucoma; C/D cup-to-disc ratio; HVF humphrey visual field; GVF goldmann visual field; OCT optical coherence tomography; IOP intraocular pressure; BRVO Branch retinal vein occlusion; CRVO central retinal vein occlusion; CRAO central retinal artery occlusion; BRAO branch retinal artery occlusion; RT retinal tear; SB scleral buckle; PPV pars plana vitrectomy; VH Vitreous hemorrhage; PRP panretinal laser photocoagulation; IVK intravitreal kenalog; VMT vitreomacular traction; MH Macular hole;  NVD neovascularization of the disc; NVE neovascularization elsewhere; AREDS age related eye disease study; ARMD age related macular degeneration; POAG primary open angle glaucoma; EBMD epithelial/anterior basement membrane dystrophy; ACIOL anterior chamber intraocular lens; IOL intraocular lens; PCIOL posterior chamber intraocular lens; Phaco/IOL phacoemulsification with intraocular lens placement; [place] photorefractive keratectomy; LASIK laser assisted in situ keratomileusis; HTN hypertension; DM diabetes mellitus; COPD chronic obstructive pulmonary disease

## 2019-06-16 NOTE — Patient Instructions (Addendum)
The nature of moderate nonproliferative diabetic retinopathy was discussed with the patient as well as the need for more frequent follow up to judge for progression. Good blood glucose, blood pressure, and serum lipid control was recommended as well as avoidance of smoking and maintenance of normal weight.  Close follow up with PCP encouraged.  There is no reason to deliver panretinal photocoagulation to either eye.  Extensive retinal atrophy from advanced glaucoma renders this occurrence unlikely in the lifetime.    Intraocular pressure well controlled on current medication(s).   Maintain current medications. Will plan follow up as appropriate.  Patient understands to follow-up with Dr. Dionne Ano regarding this issue.

## 2019-07-07 DIAGNOSIS — Z299 Encounter for prophylactic measures, unspecified: Secondary | ICD-10-CM | POA: Diagnosis not present

## 2019-07-07 DIAGNOSIS — E1142 Type 2 diabetes mellitus with diabetic polyneuropathy: Secondary | ICD-10-CM | POA: Diagnosis not present

## 2019-07-07 DIAGNOSIS — E1165 Type 2 diabetes mellitus with hyperglycemia: Secondary | ICD-10-CM | POA: Diagnosis not present

## 2019-07-07 DIAGNOSIS — J449 Chronic obstructive pulmonary disease, unspecified: Secondary | ICD-10-CM | POA: Diagnosis not present

## 2019-07-07 DIAGNOSIS — I10 Essential (primary) hypertension: Secondary | ICD-10-CM | POA: Diagnosis not present

## 2019-07-07 DIAGNOSIS — I5030 Unspecified diastolic (congestive) heart failure: Secondary | ICD-10-CM | POA: Diagnosis not present

## 2019-07-09 DIAGNOSIS — J449 Chronic obstructive pulmonary disease, unspecified: Secondary | ICD-10-CM | POA: Diagnosis not present

## 2019-07-22 DIAGNOSIS — J449 Chronic obstructive pulmonary disease, unspecified: Secondary | ICD-10-CM | POA: Diagnosis not present

## 2019-08-10 DIAGNOSIS — N1832 Chronic kidney disease, stage 3b: Secondary | ICD-10-CM | POA: Diagnosis not present

## 2019-08-10 DIAGNOSIS — E1122 Type 2 diabetes mellitus with diabetic chronic kidney disease: Secondary | ICD-10-CM | POA: Diagnosis not present

## 2019-08-10 DIAGNOSIS — I129 Hypertensive chronic kidney disease with stage 1 through stage 4 chronic kidney disease, or unspecified chronic kidney disease: Secondary | ICD-10-CM | POA: Diagnosis not present

## 2019-08-10 DIAGNOSIS — N179 Acute kidney failure, unspecified: Secondary | ICD-10-CM | POA: Diagnosis not present

## 2019-08-10 DIAGNOSIS — D631 Anemia in chronic kidney disease: Secondary | ICD-10-CM | POA: Diagnosis not present

## 2019-08-10 DIAGNOSIS — N189 Chronic kidney disease, unspecified: Secondary | ICD-10-CM | POA: Diagnosis not present

## 2019-08-10 DIAGNOSIS — R809 Proteinuria, unspecified: Secondary | ICD-10-CM | POA: Diagnosis not present

## 2019-08-19 ENCOUNTER — Other Ambulatory Visit: Payer: Self-pay

## 2019-08-19 ENCOUNTER — Encounter (HOSPITAL_COMMUNITY)
Admission: RE | Admit: 2019-08-19 | Discharge: 2019-08-19 | Disposition: A | Discharge status: Home / Self Care | Payer: Medicare Other | Source: Ambulatory Visit | Attending: Nephrology | Admitting: Nephrology

## 2019-08-19 DIAGNOSIS — D631 Anemia in chronic kidney disease: Secondary | ICD-10-CM | POA: Insufficient documentation

## 2019-08-19 DIAGNOSIS — N184 Chronic kidney disease, stage 4 (severe): Secondary | ICD-10-CM | POA: Diagnosis not present

## 2019-08-19 MED ORDER — SODIUM CHLORIDE 0.9 % IV SOLN: Freq: Once | INTRAVENOUS | Status: AC

## 2019-08-19 MED ORDER — SODIUM CHLORIDE 0.9 % IV SOLN
510.0000 mg | Freq: Once | INTRAVENOUS | Status: AC
  Administered 2019-08-19: 510 mg via INTRAVENOUS
  Filled 2019-08-19: qty 510

## 2019-08-24 ENCOUNTER — Encounter: Payer: Self-pay | Admitting: Nurse Practitioner

## 2019-08-24 DIAGNOSIS — I4821 Permanent atrial fibrillation: Secondary | ICD-10-CM | POA: Diagnosis not present

## 2019-08-24 DIAGNOSIS — I10 Essential (primary) hypertension: Secondary | ICD-10-CM | POA: Diagnosis not present

## 2019-08-26 ENCOUNTER — Encounter (HOSPITAL_COMMUNITY): Payer: Self-pay

## 2019-08-26 ENCOUNTER — Encounter (HOSPITAL_COMMUNITY)
Admission: RE | Admit: 2019-08-26 | Discharge: 2019-08-26 | Disposition: A | Discharge status: Home / Self Care | Payer: Medicare Other | Source: Ambulatory Visit | Attending: Nephrology | Admitting: Nephrology

## 2019-08-26 ENCOUNTER — Other Ambulatory Visit: Payer: Self-pay

## 2019-08-26 DIAGNOSIS — D631 Anemia in chronic kidney disease: Secondary | ICD-10-CM | POA: Diagnosis not present

## 2019-08-26 DIAGNOSIS — M159 Polyosteoarthritis, unspecified: Secondary | ICD-10-CM | POA: Diagnosis not present

## 2019-08-26 DIAGNOSIS — E119 Type 2 diabetes mellitus without complications: Secondary | ICD-10-CM | POA: Diagnosis not present

## 2019-08-26 DIAGNOSIS — N184 Chronic kidney disease, stage 4 (severe): Secondary | ICD-10-CM | POA: Diagnosis not present

## 2019-08-26 DIAGNOSIS — I10 Essential (primary) hypertension: Secondary | ICD-10-CM | POA: Diagnosis not present

## 2019-08-26 DIAGNOSIS — I509 Heart failure, unspecified: Secondary | ICD-10-CM | POA: Diagnosis not present

## 2019-08-26 MED ORDER — SODIUM CHLORIDE 0.9 % IV SOLN: INTRAVENOUS | Status: DC

## 2019-08-26 MED ORDER — FERUMOXYTOL INJECTION 510 MG/17 ML
510.0000 mg | Freq: Once | INTRAVENOUS | Status: AC
  Administered 2019-08-26: 510 mg via INTRAVENOUS
  Filled 2019-08-26: qty 510

## 2019-09-07 DIAGNOSIS — H401133 Primary open-angle glaucoma, bilateral, severe stage: Secondary | ICD-10-CM | POA: Diagnosis not present

## 2019-09-07 DIAGNOSIS — H547 Unspecified visual loss: Secondary | ICD-10-CM | POA: Diagnosis not present

## 2019-09-09 DIAGNOSIS — J449 Chronic obstructive pulmonary disease, unspecified: Secondary | ICD-10-CM | POA: Diagnosis not present

## 2019-09-23 DIAGNOSIS — J449 Chronic obstructive pulmonary disease, unspecified: Secondary | ICD-10-CM | POA: Diagnosis not present

## 2019-09-25 DIAGNOSIS — M159 Polyosteoarthritis, unspecified: Secondary | ICD-10-CM | POA: Diagnosis not present

## 2019-09-25 DIAGNOSIS — E119 Type 2 diabetes mellitus without complications: Secondary | ICD-10-CM | POA: Diagnosis not present

## 2019-09-25 DIAGNOSIS — I509 Heart failure, unspecified: Secondary | ICD-10-CM | POA: Diagnosis not present

## 2019-09-25 DIAGNOSIS — I10 Essential (primary) hypertension: Secondary | ICD-10-CM | POA: Diagnosis not present

## 2019-10-14 DIAGNOSIS — I4891 Unspecified atrial fibrillation: Secondary | ICD-10-CM | POA: Diagnosis not present

## 2019-10-14 DIAGNOSIS — I10 Essential (primary) hypertension: Secondary | ICD-10-CM | POA: Diagnosis not present

## 2019-10-14 DIAGNOSIS — Z7901 Long term (current) use of anticoagulants: Secondary | ICD-10-CM | POA: Diagnosis not present

## 2019-10-14 DIAGNOSIS — Z7982 Long term (current) use of aspirin: Secondary | ICD-10-CM | POA: Diagnosis not present

## 2019-10-14 DIAGNOSIS — E119 Type 2 diabetes mellitus without complications: Secondary | ICD-10-CM | POA: Diagnosis not present

## 2019-10-21 DIAGNOSIS — Z299 Encounter for prophylactic measures, unspecified: Secondary | ICD-10-CM | POA: Diagnosis not present

## 2019-10-21 DIAGNOSIS — E1165 Type 2 diabetes mellitus with hyperglycemia: Secondary | ICD-10-CM | POA: Diagnosis not present

## 2019-10-21 DIAGNOSIS — I5030 Unspecified diastolic (congestive) heart failure: Secondary | ICD-10-CM | POA: Diagnosis not present

## 2019-10-21 DIAGNOSIS — I739 Peripheral vascular disease, unspecified: Secondary | ICD-10-CM | POA: Diagnosis not present

## 2019-10-21 DIAGNOSIS — E1142 Type 2 diabetes mellitus with diabetic polyneuropathy: Secondary | ICD-10-CM | POA: Diagnosis not present

## 2019-10-22 ENCOUNTER — Other Ambulatory Visit (HOSPITAL_COMMUNITY)
Admission: RE | Admit: 2019-10-22 | Discharge: 2019-10-22 | Disposition: A | Discharge status: Home / Self Care | Payer: Medicare Other | Source: Ambulatory Visit | Attending: Nurse Practitioner | Admitting: Nurse Practitioner

## 2019-10-22 ENCOUNTER — Ambulatory Visit (INDEPENDENT_AMBULATORY_CARE_PROVIDER_SITE_OTHER): Payer: Medicare Other | Admitting: Nurse Practitioner

## 2019-10-22 ENCOUNTER — Encounter: Payer: Self-pay | Admitting: Nurse Practitioner

## 2019-10-22 ENCOUNTER — Other Ambulatory Visit: Payer: Self-pay

## 2019-10-22 VITALS — BP 181/63 | HR 57 | Temp 98.9°F | Ht 67.0 in | Wt 228.6 lb

## 2019-10-22 DIAGNOSIS — D649 Anemia, unspecified: Secondary | ICD-10-CM

## 2019-10-22 LAB — FERRITIN: Ferritin: 174 ng/mL (ref 11–307)

## 2019-10-22 LAB — CBC WITH DIFFERENTIAL/PLATELET
Abs Immature Granulocytes: 0.02 10*3/uL (ref 0.00–0.07)
Basophils Absolute: 0 10*3/uL (ref 0.0–0.1)
Basophils Relative: 1 %
Eosinophils Absolute: 0.2 10*3/uL (ref 0.0–0.5)
Eosinophils Relative: 4 %
HCT: 35.4 % — ABNORMAL LOW (ref 36.0–46.0)
Hemoglobin: 11.1 g/dL — ABNORMAL LOW (ref 12.0–15.0)
Immature Granulocytes: 0 %
Lymphocytes Relative: 35 %
Lymphs Abs: 1.9 10*3/uL (ref 0.7–4.0)
MCH: 31.7 pg (ref 26.0–34.0)
MCHC: 31.4 g/dL (ref 30.0–36.0)
MCV: 101.1 fL — ABNORMAL HIGH (ref 80.0–100.0)
Monocytes Absolute: 0.7 10*3/uL (ref 0.1–1.0)
Monocytes Relative: 13 %
Neutro Abs: 2.6 10*3/uL (ref 1.7–7.7)
Neutrophils Relative %: 47 %
Platelets: 235 10*3/uL (ref 150–400)
RBC: 3.5 MIL/uL — ABNORMAL LOW (ref 3.87–5.11)
RDW: 13.9 % (ref 11.5–15.5)
WBC: 5.5 10*3/uL (ref 4.0–10.5)
nRBC: 0 % (ref 0.0–0.2)

## 2019-10-22 LAB — IRON: Iron: 83 ug/dL (ref 28–170)

## 2019-10-22 NOTE — Progress Notes (Signed)
Primary Care Physician:  Glenda Chroman, MD Primary Gastroenterologist:  Dr. Gala Romney  Chief Complaint  Patient presents with  . Anemia    consult for TCS,pt said that she had negative cologuard    HPI:   Bethany [name] is a 74 y.o. female who presents to schedule procedure.  The patient was last seen in our office 01/21/2019 for anemia.  At her last visit noted to be limited historian.  History of IDA, stroke, hypertension, hyperlipidemia, A. fib on warfarin, PAD, CHF with last EF of 45%, COPD, diabetes, and GERD.  Noted history of anemia on iron for years, unsure of baseline hemoglobin.  Unsure if any hematochezia or melena.  Denies history of kidney disease despite elevated creatinine.  States she has never had a colonoscopy although review of PCP report indicates colonoscopy in 2008 that was "normal".  Has intermittent GERD which Protonix manages well.  No other overt GI complaints.  Recommended updated labs, proceed with colonoscopy.  Labs found creatinine elevated at 1.95, B12 and folate normal.  Anemia likely multifactorial in nature given chronic kidney disease.  Most recent hemoglobin in December 2020 at 10.0.  Received celiac serologies dated 02/08/2019 which was normal and no evidence of celiac disease.  The patient had follow-up visit with cardiology to discuss potential to hold warfarin for procedure.  Patient called our office 02/24/2019 indicating she wants to hold off on procedures until she is off blood thinners.  Today she states she is doing okay overall. Denies abdominal pain, N/V, hematochezia, melena, fever, chills, unintentional weight loss. Denies URI or flu-like symptoms. Denies loss of sense of taste or smell. The patient has received COVID-19 vaccination(s). Denies chest pain, dyspnea, dizziness, lightheadedness, syncope, near syncope. Denies any other upper or lower GI symptoms.  Notes baseline RLE edema (s/p fracture at some point).  States she has an abdominal hernia  that occasionally/rarely bothers her, was planning to have it repaired but is holding off due to the high influx of COVID-19 hospitalizations currently.  Past Medical History:  Diagnosis Date  . Anemia    on oral iron  . Atrial fibrillation ([place])   . CHF (congestive heart failure) ([place])   . COPD (chronic obstructive pulmonary disease) ([place])   . Diabetes mellitus without complication ([place])   . DVT (deep venous thrombosis) ([place])   . Enlarged heart   . Gangrene ([place])   . GERD (gastroesophageal reflux disease)   . Hypercholesteremia   . Hypertension   . Insomnia   . Legally blind in left eye, as defined in [place]    Decreased vision in her right eye as well with limited ability to see.   . Stroke ([place])   . Type 2 diabetes mellitus without complication ([place]) 17/10/1503    Past Surgical History:  Procedure Laterality Date  . bilareral tubal ligation    . CATARACT EXTRACTION, BILATERAL    . EYE SURGERY    . FRACTURE SURGERY Right 2005   Fx  leg  . GLAUCOMA SURGERY Right 2014  . removal of sweat glands     per pt. 30 years ago  . TIBIA FRACTURE SURGERY Right 2010  . TUBAL LIGATION      Current Outpatient Medications  Medication Sig Dispense Refill  . albuterol (PROVENTIL HFA;VENTOLIN HFA) 108 (90 BASE) MCG/ACT inhaler Inhale 2 puffs into the lungs every 6 (six) hours as needed for wheezing or shortness of breath.    Marland Kitchen amLODipine (NORVASC) 2.5 MG tablet Take  2.5 mg by mouth daily.    Marland Kitchen aspirin EC 81 MG tablet Take 81 mg by mouth daily. Swallow whole.    Marland Kitchen atorvastatin (LIPITOR) 10 MG tablet Take 10 mg by mouth daily.    Marland Kitchen b complex vitamins capsule Take 1 capsule by mouth daily.    . Blood Glucose Monitoring Suppl (FORA V30A BLOOD GLUCOSE SYSTEM) DEVI by Does not apply route.    . brimonidine (ALPHAGAN) 0.2 % ophthalmic solution 3 (three) times daily.    . candesartan (ATACAND) 32 MG tablet Take 32 mg by mouth daily.  3  . cetirizine (ZYRTEC) 10 MG tablet Take 10 mg by mouth daily.     . cloNIDine (CATAPRES) 0.1 MG tablet Take 0.1 mg by mouth 2 (two) times daily. Reported on 09/09/2015    . diltiazem (CARDIZEM CD) 240 MG 24 hr capsule Take 300 mg by mouth daily.     . Dorzolamide HCl-Timolol Mal PF 22.3-6.8 MG/ML SOLN Apply to eye.    . furosemide (LASIX) 40 MG tablet Take 80 mg by mouth daily.     Marland Kitchen glipiZIDE (GLUCOTROL XL) 2.5 MG 24 hr tablet Take 2.5 mg by mouth daily with breakfast. Takes 2 tablets by mouth daily.    . hydrALAZINE (APRESOLINE) 25 MG tablet Take 50 mg by mouth 3 (three) times daily.     . Iron, Ferrous Gluconate, 256 (28 FE) MG TABS Take 325 mg by mouth daily.     . pantoprazole (PROTONIX) 40 MG tablet Take 40 mg by mouth as needed.     . potassium chloride (K-DUR) 10 MEQ tablet Take 20 mEq by mouth daily. 1 1/2 tablet daily,Take with lasix     No current facility-administered medications for this visit.    Allergies as of 10/22/2019 - Review Complete 10/22/2019  Allergen Reaction Noted  . Sulfa antibiotics  10/21/2013  . Sulfamethoxazole Hives 09/09/2015  . Tape Other (See Comments) 10/21/2013    Family History  Problem Relation Age of Onset  . Heart disease Mother   . Hypertension Mother   . Heart attack Mother   . Hypertension Father   . Diabetes Daughter   . Colon cancer Neg Hx     Social History   Socioeconomic History  . Marital status: Divorced    Spouse name: Not on file  . Number of children: 3  . Years of education: 10 th   . Highest education level: Not on file  Occupational History  . Not on file  Tobacco Use  . Smoking status: Former Smoker    Quit date: 02/26/2001    Years since quitting: 18.6  . Smokeless tobacco: Never Used  Vaping Use  . Vaping Use: Never used  Substance and Sexual Activity  . Alcohol use: No    Alcohol/week: 0.0 standard drinks  . Drug use: No  . Sexual activity: Not on file  Other Topics Concern  . Not on file  Social History Narrative   Caffeine 1 cup daily.          Divorced. Home  alone.  3 kids, (2 deceased).  11th grade.   Social Determinants of Health   Financial Resource Strain:   . Difficulty of Paying Living Expenses: Not on file  Food Insecurity:   . Worried About Charity fundraiser in the Last Year: Not on file  . Ran Out of Food in the Last Year: Not on file  Transportation Needs:   . Lack of Transportation (Medical): Not  on file  . Lack of Transportation (Non-Medical): Not on file  Physical Activity:   . Days of Exercise per Week: Not on file  . Minutes of Exercise per Session: Not on file  Stress:   . Feeling of Stress : Not on file  Social Connections:   . Frequency of Communication with Friends and Family: Not on file  . Frequency of Social Gatherings with Friends and Family: Not on file  . Attends Religious Services: Not on file  . Active Member of Clubs or Organizations: Not on file  . Attends Archivist Meetings: Not on file  . Marital Status: Not on file  Intimate Partner Violence:   . Fear of Current or Ex-Partner: Not on file  . Emotionally Abused: Not on file  . Physically Abused: Not on file  . Sexually Abused: Not on file    Subjective: Review of Systems  Constitutional: Negative for chills, fever, malaise/fatigue and weight loss.  HENT: Negative for congestion and sore throat.   Respiratory: Negative for cough and shortness of breath.   Cardiovascular: Negative for chest pain and palpitations.  Gastrointestinal: Negative for abdominal pain, blood in stool, diarrhea, melena, nausea and vomiting.  Musculoskeletal: Negative for joint pain and myalgias.  Skin: Negative for rash.  Neurological: Negative for dizziness and weakness.  Endo/Heme/Allergies: Does not bruise/bleed easily.  Psychiatric/Behavioral: Negative for depression. The patient is not nervous/anxious.   All other systems reviewed and are negative.      Objective: BP (!) 181/63   Pulse (!) 57   Temp 98.9 F (37.2 C) (Oral)   Ht 5\' 7"  (1.702 m)    Wt 228 lb 9.6 oz (103.7 kg)   BMI 35.80 kg/m  Physical Exam Vitals and nursing note reviewed.  Constitutional:      General: She is not in acute distress.    Appearance: Normal appearance. She is well-developed. She is obese. She is not ill-appearing, toxic-appearing or diaphoretic.  HENT:     Head: Normocephalic and atraumatic.     Nose: No congestion or rhinorrhea.  Eyes:     General: No scleral icterus. Cardiovascular:     Rate and Rhythm: Normal rate and regular rhythm.     Heart sounds: Normal heart sounds.  Pulmonary:     Effort: Pulmonary effort is normal. No respiratory distress.     Breath sounds: Normal breath sounds.  Abdominal:     General: Bowel sounds are normal.     Palpations: Abdomen is soft. There is no hepatomegaly, splenomegaly or mass.     Tenderness: There is no abdominal tenderness. There is no guarding or rebound.     Hernia: No hernia is present.  Skin:    General: Skin is warm and dry.     Coloration: Skin is not jaundiced.     Findings: No rash.  Neurological:     General: No focal deficit present.     Mental Status: She is alert and oriented to person, place, and time.  Psychiatric:        Attention and Perception: Attention normal.        Mood and Affect: Mood normal.        Speech: Speech normal.        Behavior: Behavior normal.        Thought Content: Thought content normal.        Cognition and Memory: Cognition and memory normal.      Assessment:  Very pleasant 74 year old female previously evaluated  for longstanding anemia.  Last colonoscopy in 2008.  At her last office visit was recommended that she undergo colonoscopy for colorectal cancer screening, likely her last colonoscopy exam.  However, she was on warfarin at the time and deferred colonoscopy until she is off her blood thinners.  She is now off and amenable to proceed.  At this point we will proceed with scheduling as previously recommended.   Proceed with colonoscopy +/- EGD by  Dr. Gala Romney in near future: the risks, benefits, and alternatives have been discussed with the patient in detail. The patient states understanding and desires to proceed.  The patient is currently on glipizide. The patient is not on any other anticoagulants, anxiolytics, chronic pain medications, antidepressants, antidiabetics, or iron supplements.    Plan: 1. Recheck CBC and iron studies 2. Colonoscopy with possible EGD to evaluate anemia 3. Further recommendations to follow   Thank you for allowing [place] to participate in the care of Lavada T Stum  Walden Field, DNP, AGNP-C Adult & Gerontological Nurse Practitioner [facility]    10/22/2019 11:55 AM   Disclaimer: This note was dictated with voice recognition software. Similar sounding words can inadvertently be transcribed and may not be corrected upon review.

## 2019-10-22 NOTE — Patient Instructions (Signed)
Your health issues we discussed today were:   Anemia: 1. Have your labs checked when you are able to 2. We will schedule your colonoscopy and possible upper endoscopy to further evaluate for causes of your anemia 3. Call us if you see any obvious bleeding  Overall I recommend:  1. Continue your other current medications 2. Return for follow-up in 4 months 3. Call us if you have any questions or concerns   ---------------------------------------------------------------  I am glad you have gotten your COVID-19 vaccination!  Even though you are fully vaccinated you should continue to follow CDC and state/local guidelines.  ---------------------------------------------------------------   At [facility] we value your feedback. You may receive a survey about your visit today. Please share your experience as we strive to create trusting relationships with our patients to provide genuine, compassionate, quality care.  We appreciate your understanding and patience as we review any laboratory studies, imaging, and other diagnostic tests that are ordered as we care for you. Our office policy is 5 business days for review of these results, and any emergent or urgent results are addressed in a timely manner for your best interest. If you do not hear from our office in 1 week, please contact us.   We also encourage the use of MyChart, which contains your medical information for your review as well. If you are not enrolled in this feature, an access code is on this after visit summary for your convenience. Thank you for allowing [place] to be involved in your care.  It was great to see you today!  I hope you have a great rest of your summer!!

## 2019-10-28 ENCOUNTER — Other Ambulatory Visit: Payer: Self-pay

## 2019-10-28 ENCOUNTER — Telehealth: Payer: Self-pay

## 2019-10-28 NOTE — Telephone Encounter (Signed)
Called daughter Jenny Reichmann) 10/26/19 per pt's request, TCS/-/+EGD w/Dr. Gala Romney scheduled for 12/23/19 at 10:30am. COVID test 12/22/19. Appt letter mailed with procedure instructions. Orders entered.  PA for EGD (TCS not needed) submitted via [facility] website. Case approved. PA# [id number], valid 12/23/19-02/26/20.

## 2019-11-19 DIAGNOSIS — Z87891 Personal history of nicotine dependence: Secondary | ICD-10-CM | POA: Diagnosis not present

## 2019-11-19 DIAGNOSIS — J441 Chronic obstructive pulmonary disease with (acute) exacerbation: Secondary | ICD-10-CM | POA: Diagnosis not present

## 2019-11-19 DIAGNOSIS — I6340 Cerebral infarction due to embolism of unspecified cerebral artery: Secondary | ICD-10-CM | POA: Diagnosis not present

## 2019-11-19 DIAGNOSIS — J449 Chronic obstructive pulmonary disease, unspecified: Secondary | ICD-10-CM | POA: Diagnosis not present

## 2019-11-19 DIAGNOSIS — Z299 Encounter for prophylactic measures, unspecified: Secondary | ICD-10-CM | POA: Diagnosis not present

## 2019-11-19 DIAGNOSIS — I4891 Unspecified atrial fibrillation: Secondary | ICD-10-CM | POA: Diagnosis not present

## 2019-11-23 DIAGNOSIS — D631 Anemia in chronic kidney disease: Secondary | ICD-10-CM | POA: Diagnosis not present

## 2019-11-23 DIAGNOSIS — Z1231 Encounter for screening mammogram for malignant neoplasm of breast: Secondary | ICD-10-CM | POA: Diagnosis not present

## 2019-11-23 DIAGNOSIS — E1122 Type 2 diabetes mellitus with diabetic chronic kidney disease: Secondary | ICD-10-CM | POA: Diagnosis not present

## 2019-11-23 DIAGNOSIS — N189 Chronic kidney disease, unspecified: Secondary | ICD-10-CM | POA: Diagnosis not present

## 2019-11-23 DIAGNOSIS — N1832 Chronic kidney disease, stage 3b: Secondary | ICD-10-CM | POA: Diagnosis not present

## 2019-11-23 DIAGNOSIS — N2581 Secondary hyperparathyroidism of renal origin: Secondary | ICD-10-CM | POA: Diagnosis not present

## 2019-11-23 DIAGNOSIS — I129 Hypertensive chronic kidney disease with stage 1 through stage 4 chronic kidney disease, or unspecified chronic kidney disease: Secondary | ICD-10-CM | POA: Diagnosis not present

## 2019-11-24 DIAGNOSIS — I10 Essential (primary) hypertension: Secondary | ICD-10-CM | POA: Diagnosis not present

## 2019-11-24 DIAGNOSIS — I5032 Chronic diastolic (congestive) heart failure: Secondary | ICD-10-CM | POA: Diagnosis not present

## 2019-11-24 DIAGNOSIS — I4821 Permanent atrial fibrillation: Secondary | ICD-10-CM | POA: Diagnosis not present

## 2019-11-26 DIAGNOSIS — E119 Type 2 diabetes mellitus without complications: Secondary | ICD-10-CM | POA: Diagnosis not present

## 2019-11-26 DIAGNOSIS — I10 Essential (primary) hypertension: Secondary | ICD-10-CM | POA: Diagnosis not present

## 2019-11-26 DIAGNOSIS — M159 Polyosteoarthritis, unspecified: Secondary | ICD-10-CM | POA: Diagnosis not present

## 2019-11-26 DIAGNOSIS — I509 Heart failure, unspecified: Secondary | ICD-10-CM | POA: Diagnosis not present

## 2019-11-29 DIAGNOSIS — J449 Chronic obstructive pulmonary disease, unspecified: Secondary | ICD-10-CM | POA: Diagnosis not present

## 2019-11-30 DIAGNOSIS — H35371 Puckering of macula, right eye: Secondary | ICD-10-CM | POA: Diagnosis not present

## 2019-11-30 DIAGNOSIS — H401133 Primary open-angle glaucoma, bilateral, severe stage: Secondary | ICD-10-CM | POA: Diagnosis not present

## 2019-11-30 DIAGNOSIS — H547 Unspecified visual loss: Secondary | ICD-10-CM | POA: Diagnosis not present

## 2019-12-22 ENCOUNTER — Other Ambulatory Visit: Payer: Self-pay

## 2019-12-22 ENCOUNTER — Other Ambulatory Visit (HOSPITAL_COMMUNITY)
Admission: RE | Admit: 2019-12-22 | Discharge: 2019-12-22 | Disposition: A | Discharge status: Home / Self Care | Payer: Medicare Other | Source: Ambulatory Visit | Attending: Internal Medicine | Admitting: Internal Medicine

## 2019-12-22 DIAGNOSIS — Z20822 Contact with and (suspected) exposure to covid-19: Secondary | ICD-10-CM | POA: Insufficient documentation

## 2019-12-22 DIAGNOSIS — Z01812 Encounter for preprocedural laboratory examination: Secondary | ICD-10-CM | POA: Diagnosis not present

## 2019-12-22 LAB — SARS CORONAVIRUS 2 (TAT 6-24 HRS): SARS Coronavirus 2: NEGATIVE

## 2019-12-23 ENCOUNTER — Ambulatory Visit (HOSPITAL_COMMUNITY)
Admission: RE | Admit: 2019-12-23 | Discharge: 2019-12-23 | Disposition: A | Discharge status: Home / Self Care | Payer: Medicare Other | Attending: Internal Medicine | Admitting: Internal Medicine

## 2019-12-23 ENCOUNTER — Encounter (HOSPITAL_COMMUNITY): Payer: Self-pay | Admitting: Internal Medicine

## 2019-12-23 ENCOUNTER — Encounter (HOSPITAL_COMMUNITY)
Admission: RE | Disposition: A | Discharge status: Home / Self Care | Payer: Self-pay | Source: Home / Self Care | Attending: Internal Medicine

## 2019-12-23 ENCOUNTER — Other Ambulatory Visit: Payer: Self-pay

## 2019-12-23 DIAGNOSIS — Z538 Procedure and treatment not carried out for other reasons: Secondary | ICD-10-CM | POA: Insufficient documentation

## 2019-12-23 DIAGNOSIS — D649 Anemia, unspecified: Secondary | ICD-10-CM | POA: Diagnosis not present

## 2019-12-23 HISTORY — DX: Personal history of urinary calculi: Z87.442

## 2019-12-23 SURGERY — COLONOSCOPY
Anesthesia: Moderate Sedation

## 2019-12-23 MED ORDER — ONDANSETRON HCL 4 MG/2ML IJ SOLN
INTRAMUSCULAR | Status: AC
  Filled 2019-12-23: qty 2

## 2019-12-23 MED ORDER — MEPERIDINE HCL 50 MG/ML IJ SOLN
INTRAMUSCULAR | Status: AC
  Filled 2019-12-23: qty 1

## 2019-12-23 MED ORDER — LIDOCAINE VISCOUS HCL 2 % MT SOLN
OROMUCOSAL | Status: AC
  Filled 2019-12-23: qty 15

## 2019-12-23 MED ORDER — MIDAZOLAM HCL 5 MG/5ML IJ SOLN
INTRAMUSCULAR | Status: AC
  Filled 2019-12-23: qty 10

## 2019-12-23 NOTE — Telephone Encounter (Signed)
Per our conversation, please reschedule the patient for her procedure as soon as possible

## 2019-12-23 NOTE — OR Nursing (Signed)
During pre-procedure, it was noted that patient had not completed a bowel prep. She stated that she was not given instructions for a colonoscopy or a prep solution.  This was verified with patient's daughter. Dr. Gala Romney cancelled the procedures for today and contacted the office about rescheduling with the correct prep instructions and advised that the patient go to the office now for instructions.  Patient and daughter notified and agreed.

## 2019-12-23 NOTE — Telephone Encounter (Signed)
Called pt's daughter Jenny Reichmann) as she had requested at [place]. TCS/-/+EGD w/Dr. Gala Romney rescheduled to 01/29/20 at 9:30am. Offered to schedule procedure 01/12/20 but daughter unable to do that day. COVID test 01/28/20 at 11:00am. Orders entered. New instructions mailed.  PA was valid 12/23/19-02/26/20. PA# [id number] for EGD (TCS doesn't need PA).

## 2019-12-29 DIAGNOSIS — H35371 Puckering of macula, right eye: Secondary | ICD-10-CM | POA: Diagnosis not present

## 2020-01-03 DIAGNOSIS — H35371 Puckering of macula, right eye: Secondary | ICD-10-CM | POA: Insufficient documentation

## 2020-01-11 DIAGNOSIS — H401133 Primary open-angle glaucoma, bilateral, severe stage: Secondary | ICD-10-CM | POA: Diagnosis not present

## 2020-01-11 DIAGNOSIS — H35371 Puckering of macula, right eye: Secondary | ICD-10-CM | POA: Diagnosis not present

## 2020-01-18 ENCOUNTER — Encounter (INDEPENDENT_AMBULATORY_CARE_PROVIDER_SITE_OTHER): Payer: Self-pay | Admitting: Ophthalmology

## 2020-01-18 ENCOUNTER — Other Ambulatory Visit: Payer: Self-pay

## 2020-01-18 ENCOUNTER — Ambulatory Visit (INDEPENDENT_AMBULATORY_CARE_PROVIDER_SITE_OTHER): Payer: Medicare Other | Admitting: Ophthalmology

## 2020-01-18 DIAGNOSIS — E113393 Type 2 diabetes mellitus with moderate nonproliferative diabetic retinopathy without macular edema, bilateral: Secondary | ICD-10-CM

## 2020-01-18 DIAGNOSIS — H401133 Primary open-angle glaucoma, bilateral, severe stage: Secondary | ICD-10-CM

## 2020-01-18 DIAGNOSIS — H3554 Dystrophies primarily involving the retinal pigment epithelium: Secondary | ICD-10-CM

## 2020-01-18 NOTE — Assessment & Plan Note (Signed)
OU not likely to progress diabetic retinopathy with the amount of fleck pigmentary retinal pigment epithelial degeneration as well as presence of absolute glaucoma left eye is protective against diabetic retinopathy development left eye

## 2020-01-18 NOTE — Progress Notes (Signed)
01/18/2020     CHIEF COMPLAINT Patient presents for Retina Follow Up   HISTORY OF PRESENT ILLNESS: Bethany [name] is a 74 y.o. female who presents to the clinic today for:   HPI    Retina Follow Up    Patient presents with  Diabetic Retinopathy.  In both eyes.  This started 7 months ago.  Severity is mild.  Duration of 7 months.  Since onset it is stable.          Comments    7 Month Diabetic F/U OU  Pt sts VA OD is "real cloudy."  Pt denies ocular pain, flashes of light, or floaters OU.  A1c: 7.0, 09/2019 LBS: 140 this AM         Last edited by Rockie Neighbours, [place] on 01/18/2020 10:42 AM. (History)      Referring physician: Glenda Chroman, MD [address],  [place] [address]  HISTORICAL INFORMATION:   Selected notes from the MEDICAL RECORD NUMBER       CURRENT MEDICATIONS: Current Outpatient Medications (Ophthalmic Drugs)  Medication Sig  . brimonidine (ALPHAGAN) 0.2 % ophthalmic solution Place 1 drop into the right eye in the morning and at bedtime.   . Dorzolamide HCl-Timolol Mal PF 22.3-6.8 MG/ML SOLN Place 1 drop into the right eye in the morning and at bedtime.   Marland Kitchen latanoprost (XALATAN) 0.005 % ophthalmic solution Place 1 drop into the right eye at bedtime.   No current facility-administered medications for this visit. (Ophthalmic Drugs)   Current Outpatient Medications (Other)  Medication Sig  . albuterol (PROVENTIL HFA;VENTOLIN HFA) 108 (90 BASE) MCG/ACT inhaler Inhale 2 puffs into the lungs every 6 (six) hours as needed for wheezing or shortness of breath. (Patient not taking: Reported on 12/16/2019)  . amLODipine (NORVASC) 2.5 MG tablet Take 2.5 mg by mouth daily.  Marland Kitchen aspirin EC 81 MG tablet Take 81 mg by mouth daily. Swallow whole.  Marland Kitchen atorvastatin (LIPITOR) 10 MG tablet Take 10 mg by mouth daily.  Marland Kitchen b complex vitamins capsule Take 1 capsule by mouth daily.  . Blood Glucose Monitoring Suppl (FORA V30A BLOOD GLUCOSE SYSTEM) DEVI by Does not apply route.   . candesartan (ATACAND) 32 MG tablet Take 32 mg by mouth daily.  . cetirizine (ZYRTEC) 10 MG tablet Take 10 mg by mouth daily.  . cloNIDine (CATAPRES) 0.1 MG tablet Take 0.1 mg by mouth 2 (two) times daily. Reported on 09/09/2015 (Patient not taking: Reported on 12/16/2019)  . diltiazem (CARDIZEM CD) 300 MG 24 hr capsule Take 300 mg by mouth daily.   . furosemide (LASIX) 40 MG tablet Take 80 mg by mouth daily.   Marland Kitchen glipiZIDE (GLUCOTROL XL) 2.5 MG 24 hr tablet Take 2.5 mg by mouth in the morning and at bedtime.   . hydrALAZINE (APRESOLINE) 50 MG tablet Take 50 mg by mouth 3 (three) times daily.   . Iron, Ferrous Gluconate, 256 (28 FE) MG TABS Take 325 mg by mouth daily.   . pantoprazole (PROTONIX) 40 MG tablet Take 40 mg by mouth daily.   . Potassium Chloride ER 20 MEQ TBCR Take 30 mEq by mouth daily.    No current facility-administered medications for this visit. (Other)      REVIEW OF SYSTEMS:    ALLERGIES Allergies  Allergen Reactions  . Sulfa Antibiotics Hives and Swelling  . Sulfamethoxazole Hives  . Tape Other (See Comments)    Pulls skin off. Skin irritation and breakdown  PAST MEDICAL HISTORY Past Medical History:  Diagnosis Date  . Anemia    on oral iron  . Atrial fibrillation ([place])   . CHF (congestive heart failure) ([place])   . COPD (chronic obstructive pulmonary disease) ([place])   . Diabetes mellitus without complication ([place])   . DVT (deep venous thrombosis) ([place])   . Enlarged heart   . Gangrene ([place])   . GERD (gastroesophageal reflux disease)   . History of kidney stones   . Hypercholesteremia   . Hypertension   . Insomnia   . Legally blind in left eye, as defined in [place]    Decreased vision in her right eye as well with limited ability to see.   . Stroke ([place])   . Type 2 diabetes mellitus without complication ([place]) 67/03/945   Past Surgical History:  Procedure Laterality Date  . bilareral tubal ligation    . CATARACT EXTRACTION, BILATERAL    . EYE  SURGERY    . FRACTURE SURGERY Right 2005   Fx  leg  . GLAUCOMA SURGERY Right 2014  . removal of sweat glands     per pt. 30 years ago  . TIBIA FRACTURE SURGERY Right 2010  . TUBAL LIGATION      FAMILY HISTORY Family History  Problem Relation Age of Onset  . Heart disease Mother   . Hypertension Mother   . Heart attack Mother   . Hypertension Father   . Diabetes Daughter   . Colon cancer Neg Hx     SOCIAL HISTORY Social History   Tobacco Use  . Smoking status: Former Smoker    Quit date: 02/26/2001    Years since quitting: 18.9  . Smokeless tobacco: Never Used  Vaping Use  . Vaping Use: Never used  Substance Use Topics  . Alcohol use: No    Alcohol/week: 0.0 standard drinks  . Drug use: No         OPHTHALMIC EXAM: Base Eye Exam    Visual Acuity (ETDRS)      Right Left   Dist [place] 20/400 NLP   Dist ph [place] NI        Tonometry (Tonopen, 10:38 AM)      Right Left   Pressure 31 31       Tonometry #2 (Tonopen, 10:45 AM)      Right Left   Pressure 31 31       Tonometry #3 (Applanation, 11:33 AM)      Right Left   Pressure 17        Pupils      Dark Light Shape React APD   Right 7 6 Round Brisk None   Left 7 7 Round Minimal +3       Visual Fields (Counting fingers)      Left Right   Restrictions Total superior temporal, inferior temporal, superior nasal, inferior nasal deficiencies Total superior temporal, inferior temporal, superior nasal, inferior nasal deficiencies       Extraocular Movement      Right Left    Full Full       Neuro/Psych    Oriented x3: Yes   Mood/Affect: Normal       Dilation    Both eyes: 1.0% Mydriacyl, 2.5% Phenylephrine @ 11:34 AM  Defer due to high IOP        Slit Lamp and Fundus Exam    External Exam      Right Left   External Normal Normal  Slit Lamp Exam      Right Left   Lids/Lashes Normal Normal   Conjunctiva/Sclera White and quiet White and quiet, pale bleb SN   Cornea Clear Clear   Anterior  Chamber Deep and quiet, tube shunt Deep and quiet   Iris Round and reactive Round and reactive   Lens Posterior chamber intraocular lens Posterior chamber intraocular lens, 2+ Posterior capsular opacification   Anterior Vitreous Normal Normal       Fundus Exam      Right Left   Posterior Vitreous Posterior vitreous detachment Posterior vitreous detachment   Disc Some pallor Completely cupped   C/D Ratio 0.85 1.0   Macula Microaneurysms, flecks, pigment Microaneurysms, flecks, pigment   Vessels NPDR- Moderate NPDR- Moderate   Periphery Flecks, Pigmentation, Abnormal pigmentation Flecks, Pigmentation, Abnormal pigmentation          IMAGING AND PROCEDURES  Imaging and Procedures for 01/18/20  Color Fundus Photography Optos - OU - Both Eyes       Right Eye Progression has been stable. Disc findings include increased cup to disc ratio, thinning of rim. Macula : normal observations.   Left Eye Disc findings include increased cup to disc ratio, pallor, thinning of rim.                 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 Dionne Ano  A recheck of screening Tono-Pen pressure today found 31 mm OD which was not confirmed by tactile pressure, and thus applanation performed and found to be 17 by MD check OD  Moderate nonproliferative diabetic retinopathy of both eyes ([place]) OU not likely to progress diabetic retinopathy with the amount of fleck pigmentary retinal pigment epithelial degeneration as well as presence of absolute glaucoma left eye is protective against diabetic retinopathy development left eye      ICD-10-CM   1. Moderate nonproliferative diabetic retinopathy of both eyes without macular edema associated with type 2 diabetes mellitus (HCC)  O84.1660 Color Fundus  Photography Optos - OU - Both Eyes  2. 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 Primary open angle glaucoma of both eyes, severe stage  H40.1133     1.  Follow-up with Dr. Dionne Ano as scheduled  2.  Continue with good blood sugar control, not too aggressive so as to avoid hypoglycemia  3.  Ophthalmic Meds Ordered this visit:  No orders of the defined types were placed in this encounter.      Return in about 1 year (around 01/17/2021) for DILATE OU, COLOR FP, OCT.  There are no Patient Instructions on file for this visit.   Explained the diagnoses, plan, and follow up with the patient and they expressed understanding.  Patient expressed understanding of the importance of proper follow up care.   Clent Demark Blanka Rockholt M.D. Diseases & Surgery of the Retina and Vitreous Retina & Diabetic [place] 01/18/20     Abbreviations: M myopia (nearsighted); A astigmatism; H hyperopia (farsighted); P presbyopia; Mrx spectacle prescription;  CTL contact lenses; OD right eye; OS left eye; OU both eyes  XT exotropia; ET esotropia; PEK punctate epithelial keratitis; PEE punctate epithelial erosions; DES dry eye syndrome; MGD meibomian gland dysfunction; ATs artificial tears; PFAT's preservative free artificial tears; [place] nuclear sclerotic cataract; PSC posterior subcapsular cataract; ERM epi-retinal membrane; PVD  posterior vitreous detachment; RD retinal detachment; DM diabetes mellitus; DR diabetic retinopathy; NPDR non-proliferative diabetic retinopathy; PDR proliferative diabetic retinopathy; CSME clinically significant macular edema; DME diabetic macular edema; dbh dot blot hemorrhages; CWS cotton wool spot; POAG primary open angle glaucoma; C/D cup-to-disc ratio; HVF humphrey visual field; GVF goldmann visual field; OCT optical coherence tomography; IOP intraocular pressure; BRVO Branch retinal vein occlusion; CRVO central retinal vein occlusion; CRAO central  retinal artery occlusion; BRAO branch retinal artery occlusion; RT retinal tear; SB scleral buckle; PPV pars plana vitrectomy; VH Vitreous hemorrhage; PRP panretinal laser photocoagulation; IVK intravitreal kenalog; VMT vitreomacular traction; MH Macular hole;  NVD neovascularization of the disc; NVE neovascularization elsewhere; AREDS age related eye disease study; ARMD age related macular degeneration; POAG primary open angle glaucoma; EBMD epithelial/anterior basement membrane dystrophy; ACIOL anterior chamber intraocular lens; IOL intraocular lens; PCIOL posterior chamber intraocular lens; Phaco/IOL phacoemulsification with intraocular lens placement; [place] photorefractive keratectomy; LASIK laser assisted in situ keratomileusis; HTN hypertension; DM diabetes mellitus; COPD chronic obstructive pulmonary disease

## 2020-01-18 NOTE — Assessment & Plan Note (Signed)
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

## 2020-01-18 NOTE — Assessment & Plan Note (Signed)
Management OU as per Dr. Dionne Ano  A recheck of screening Tono-Pen pressure today found 31 mm OD which was not confirmed by tactile pressure, and thus applanation performed and found to be 17 by MD check OD

## 2020-01-20 DIAGNOSIS — J449 Chronic obstructive pulmonary disease, unspecified: Secondary | ICD-10-CM | POA: Diagnosis not present

## 2020-01-26 ENCOUNTER — Other Ambulatory Visit (HOSPITAL_COMMUNITY): Payer: Self-pay | Admitting: *Deleted

## 2020-01-26 DIAGNOSIS — E119 Type 2 diabetes mellitus without complications: Secondary | ICD-10-CM | POA: Diagnosis not present

## 2020-01-26 DIAGNOSIS — M159 Polyosteoarthritis, unspecified: Secondary | ICD-10-CM | POA: Diagnosis not present

## 2020-01-26 DIAGNOSIS — I509 Heart failure, unspecified: Secondary | ICD-10-CM | POA: Diagnosis not present

## 2020-01-26 DIAGNOSIS — I10 Essential (primary) hypertension: Secondary | ICD-10-CM | POA: Diagnosis not present

## 2020-01-28 ENCOUNTER — Other Ambulatory Visit (HOSPITAL_COMMUNITY)
Admit: 2020-01-28 | Discharge: 2020-01-28 | Disposition: A | Discharge status: Home / Self Care | Payer: Medicare Other | Attending: Internal Medicine | Admitting: Internal Medicine

## 2020-01-28 ENCOUNTER — Other Ambulatory Visit: Payer: Self-pay

## 2020-01-28 DIAGNOSIS — Z20822 Contact with and (suspected) exposure to covid-19: Secondary | ICD-10-CM | POA: Diagnosis not present

## 2020-01-28 DIAGNOSIS — Z01812 Encounter for preprocedural laboratory examination: Secondary | ICD-10-CM | POA: Insufficient documentation

## 2020-01-28 LAB — SARS CORONAVIRUS 2 (TAT 6-24 HRS): SARS Coronavirus 2: NEGATIVE

## 2020-01-29 ENCOUNTER — Encounter (HOSPITAL_COMMUNITY): Payer: Self-pay | Admitting: Internal Medicine

## 2020-01-29 ENCOUNTER — Other Ambulatory Visit: Payer: Self-pay

## 2020-01-29 ENCOUNTER — Ambulatory Visit (HOSPITAL_COMMUNITY)
Admission: RE | Admit: 2020-01-29 | Discharge: 2020-01-29 | Disposition: A | Discharge status: Home / Self Care | Payer: Medicare Other | Attending: Internal Medicine | Admitting: Internal Medicine

## 2020-01-29 ENCOUNTER — Encounter (HOSPITAL_COMMUNITY)
Admission: RE | Disposition: A | Discharge status: Home / Self Care | Payer: Self-pay | Source: Home / Self Care | Attending: Internal Medicine

## 2020-01-29 DIAGNOSIS — Z888 Allergy status to other drugs, medicaments and biological substances status: Secondary | ICD-10-CM | POA: Diagnosis not present

## 2020-01-29 DIAGNOSIS — Q438 Other specified congenital malformations of intestine: Secondary | ICD-10-CM | POA: Insufficient documentation

## 2020-01-29 DIAGNOSIS — Z882 Allergy status to sulfonamides status: Secondary | ICD-10-CM | POA: Diagnosis not present

## 2020-01-29 DIAGNOSIS — Z79899 Other long term (current) drug therapy: Secondary | ICD-10-CM | POA: Insufficient documentation

## 2020-01-29 DIAGNOSIS — K2950 Unspecified chronic gastritis without bleeding: Secondary | ICD-10-CM | POA: Diagnosis not present

## 2020-01-29 DIAGNOSIS — K5730 Diverticulosis of large intestine without perforation or abscess without bleeding: Secondary | ICD-10-CM | POA: Diagnosis not present

## 2020-01-29 DIAGNOSIS — D509 Iron deficiency anemia, unspecified: Secondary | ICD-10-CM

## 2020-01-29 DIAGNOSIS — Z87891 Personal history of nicotine dependence: Secondary | ICD-10-CM | POA: Insufficient documentation

## 2020-01-29 DIAGNOSIS — Z7984 Long term (current) use of oral hypoglycemic drugs: Secondary | ICD-10-CM | POA: Insufficient documentation

## 2020-01-29 DIAGNOSIS — Z7982 Long term (current) use of aspirin: Secondary | ICD-10-CM | POA: Diagnosis not present

## 2020-01-29 DIAGNOSIS — R233 Spontaneous ecchymoses: Secondary | ICD-10-CM | POA: Insufficient documentation

## 2020-01-29 HISTORY — PX: ESOPHAGOGASTRODUODENOSCOPY: SHX5428

## 2020-01-29 HISTORY — PX: BIOPSY: SHX5522

## 2020-01-29 HISTORY — PX: COLONOSCOPY: SHX5424

## 2020-01-29 LAB — GLUCOSE, CAPILLARY: Glucose-Capillary: 118 mg/dL — ABNORMAL HIGH (ref 70–99)

## 2020-01-29 SURGERY — COLONOSCOPY
Anesthesia: Moderate Sedation

## 2020-01-29 MED ORDER — LIDOCAINE VISCOUS HCL 2 % MT SOLN
OROMUCOSAL | Status: AC
  Filled 2020-01-29: qty 15

## 2020-01-29 MED ORDER — STERILE WATER FOR IRRIGATION IR SOLN
Status: DC | PRN
  Administered 2020-01-29: 100 mL

## 2020-01-29 MED ORDER — MIDAZOLAM HCL 5 MG/5ML IJ SOLN
INTRAMUSCULAR | Status: AC
  Filled 2020-01-29: qty 10

## 2020-01-29 MED ORDER — MEPERIDINE HCL 50 MG/ML IJ SOLN
INTRAMUSCULAR | Status: AC
  Filled 2020-01-29: qty 1

## 2020-01-29 MED ORDER — SODIUM CHLORIDE 0.9 % IV SOLN
INTRAVENOUS | Status: DC
  Administered 2020-01-29: 1000 mL via INTRAVENOUS

## 2020-01-29 MED ORDER — ONDANSETRON HCL 4 MG/2ML IJ SOLN
INTRAMUSCULAR | Status: AC
  Filled 2020-01-29: qty 2

## 2020-01-29 MED ORDER — MEPERIDINE HCL 100 MG/ML IJ SOLN
INTRAMUSCULAR | Status: DC | PRN
  Administered 2020-01-29: 15 mg via INTRAVENOUS
  Administered 2020-01-29: 25 mg via INTRAVENOUS
  Administered 2020-01-29: 10 mg via INTRAVENOUS

## 2020-01-29 MED ORDER — ONDANSETRON HCL 4 MG/2ML IJ SOLN
INTRAMUSCULAR | Status: DC | PRN
  Administered 2020-01-29: 4 mg via INTRAVENOUS

## 2020-01-29 MED ORDER — LIDOCAINE VISCOUS HCL 2 % MT SOLN
OROMUCOSAL | Status: DC | PRN
  Administered 2020-01-29: 4 mL via OROMUCOSAL

## 2020-01-29 MED ORDER — MIDAZOLAM HCL 5 MG/5ML IJ SOLN
INTRAMUSCULAR | Status: DC | PRN
  Administered 2020-01-29 (×2): 1 mg via INTRAVENOUS
  Administered 2020-01-29: 2 mg via INTRAVENOUS
  Administered 2020-01-29: 1 mg via INTRAVENOUS
  Administered 2020-01-29: 2 mg via INTRAVENOUS
  Administered 2020-01-29: 1 mg via INTRAVENOUS

## 2020-01-29 NOTE — Op Note (Addendum)
[facility] Patient Name: Bethany [name] Procedure Date: 01/29/2020 9:16 AM MRN: [id number] Date of Birth: 26-Nov-1945 Attending MD: Norvel Richards , MD CSN: [id number] Age: 74 Admit Type: Outpatient Procedure:                Upper GI endoscopy Indications:              Iron deficiency anemia Providers:                Norvel Richards, MD, Lambert Mody, Tammy                            Vaught, RN, Aram Candela Referring MD:              Medicines:                Midazolam 9 mg IV, Meperidine 50 mg IV Complications:            No immediate complications. Estimated Blood Loss:     Estimated blood loss was minimal. Procedure:                Pre-Anesthesia Assessment:                           - Prior to the procedure, a History and Physical                            was performed, and patient medications and                            allergies were reviewed. The patient's tolerance of                            previous anesthesia was also reviewed. The risks                            and benefits of the procedure and the sedation                            options and risks were discussed with the patient.                            All questions were answered, and informed consent                            was obtained. Prior Anticoagulants: The patient has                            taken no previous anticoagulant or antiplatelet                            agents. ASA Grade Assessment: III - A patient with                            severe systemic disease. After reviewing the risks  and benefits, the patient was deemed in                            satisfactory condition to undergo the procedure.                           After obtaining informed consent, the endoscope was                            passed under direct vision. Throughout the                            procedure, the patient's blood pressure, pulse, and                             oxygen saturations were monitored continuously. The                            [id number]) was introduced through the mouth,                            and advanced to the second part of duodenum. The                            upper GI endoscopy was accomplished without                            difficulty. The patient tolerated the procedure                            well. Scope In: 10:36:42 AM Scope Out: 10:41:27 AM Total Procedure Duration: 0 hours 4 minutes 45 seconds  Findings:      The examined esophagus was normal.      Scattered submucosal petechiae with patchy erythema. No ulcer or       infiltrating process.      The duodenal bulb and second portion of the duodenum were normal aside       from pigment laden mucosa (iron). gastric biopsies taken to assess for       H. pylori. Impression:               - Normal esophagus. Submucosal gastric petechiae                            hemorrhage and erythema. Status post biopsy                           - Normal duodenal bulb and second portion of the                            duodenum (pigment laden mucosa)                           - Moderate Sedation:      Moderate (conscious) sedation was administered by the endoscopy nurse       and  supervised by the endoscopist. The following parameters were       monitored: oxygen saturation, heart rate, blood pressure, respiratory       rate, EKG, adequacy of pulmonary ventilation, and response to care.       Total physician intraservice time was 11 minutes. Recommendation:           - Patient has a contact number available for                            emergencies. The signs and symptoms of potential                            delayed complications were discussed with the                            patient. Return to normal activities tomorrow.                            Written discharge instructions were provided to the                            patient.                            - Advance diet as tolerated. Follow-up on                            pathology. See colonoscopy report. Procedure Code(s):        --- Professional ---                           [phone], Esophagogastroduodenoscopy, flexible,                            transoral; diagnostic, including collection of                            specimen(s) by brushing or washing, when performed                            (separate procedure)                           G0500, Moderate sedation services provided by the                            same physician or other qualified health care                            professional performing a gastrointestinal                            endoscopic service that sedation supports,                            requiring the presence of an independent trained  observer to assist in the monitoring of the                            patient's level of consciousness and physiological                            status; initial 15 minutes of intra-service time;                            patient age 26 years or older (additional time may                            be reported with [phone], as appropriate) Diagnosis Code(s):        --- Professional ---                           D50.9, Iron deficiency anemia, unspecified CPT copyright 2019 American Medical Association. All rights reserved. The codes documented in this report are preliminary and upon coder review may  be revised to meet current compliance requirements. Cristopher Estimable. Yug Loria, MD Norvel Richards, MD 01/29/2020 10:51:23 AM This report has been signed electronically. Number of Addenda: 1 Addendum Number: 1   Addendum Date: 02/17/2020 8:29:47 AM                            Cristopher Estimable. Marinus Eicher, MD Norvel Richards, MD 02/17/2020 8:30:46 AM This report has been signed electronically.

## 2020-01-29 NOTE — Discharge Instructions (Signed)
Colonoscopy Discharge Instructions  Read the instructions outlined below and refer to this sheet in the next few weeks. These discharge instructions provide you with general information on caring for yourself after you leave the hospital. Your doctor may also give you specific instructions. While your treatment has been planned according to the most current medical practices available, unavoidable complications occasionally occur. If you have any problems or questions after discharge, call Dr. Gala Romney at [phone]. ACTIVITY  You may resume your regular activity, but move at a slower pace for the next 24 hours.   Take frequent rest periods for the next 24 hours.   Walking will help get rid of the air and reduce the bloated feeling in your belly (abdomen).   No driving for 24 hours (because of the medicine (anesthesia) used during the test).    Do not sign any important legal documents or operate any machinery for 24 hours (because of the anesthesia used during the test).  NUTRITION  Drink plenty of fluids.   You may resume your normal diet as instructed by your doctor.   Begin with a light meal and progress to your normal diet. Heavy or fried foods are harder to digest and may make you feel sick to your stomach (nauseated).   Avoid alcoholic beverages for 24 hours or as instructed.  MEDICATIONS  You may resume your normal medications unless your doctor tells you otherwise.  WHAT YOU CAN EXPECT TODAY  Some feelings of bloating in the abdomen.   Passage of more gas than usual.   Spotting of blood in your stool or on the toilet paper.  IF YOU HAD POLYPS REMOVED DURING THE COLONOSCOPY:  No aspirin products for 7 days or as instructed.   No alcohol for 7 days or as instructed.   Eat a soft diet for the next 24 hours.  FINDING OUT THE RESULTS OF YOUR TEST Not all test results are available during your visit. If your test results are not back during the visit, make an appointment  with your caregiver to find out the results. Do not assume everything is normal if you have not heard from your caregiver or the medical facility. It is important for you to follow up on all of your test results.  SEEK IMMEDIATE MEDICAL ATTENTION IF:  You have more than a spotting of blood in your stool.   Your belly is swollen (abdominal distention).   You are nauseated or vomiting.   You have a temperature over 101.   You have abdominal pain or discomfort that is severe or gets worse throughout the day.  EGD Discharge instructions Please read the instructions outlined below and refer to this sheet in the next few weeks. These discharge instructions provide you with general information on caring for yourself after you leave the hospital. Your doctor may also give you specific instructions. While your treatment has been planned according to the most current medical practices available, unavoidable complications occasionally occur. If you have any problems or questions after discharge, please call your doctor. ACTIVITY  You may resume your regular activity but move at a slower pace for the next 24 hours.   Take frequent rest periods for the next 24 hours.   Walking will help expel (get rid of) the air and reduce the bloated feeling in your abdomen.   No driving for 24 hours (because of the anesthesia (medicine) used during the test).   You may shower.   Do not sign any important  legal documents or operate any machinery for 24 hours (because of the anesthesia used during the test).  NUTRITION  Drink plenty of fluids.   You may resume your normal diet.   Begin with a light meal and progress to your normal diet.   Avoid alcoholic beverages for 24 hours or as instructed by your caregiver.  MEDICATIONS  You may resume your normal medications unless your caregiver tells you otherwise.  WHAT YOU CAN EXPECT TODAY  You may experience abdominal discomfort such as a feeling of fullness  or "gas" pains.  FOLLOW-UP  Your doctor will discuss the results of your test with you.  SEEK IMMEDIATE MEDICAL ATTENTION IF ANY OF THE FOLLOWING OCCUR:  Excessive nausea (feeling sick to your stomach) and/or vomiting.   Severe abdominal pain and distention (swelling).   Trouble swallowing.   Temperature over 101 F (37.8 C).   Rectal bleeding or vomiting of blood.    Diverticulosis  Diverticulosis is a condition that develops when small pouches (diverticula) form in the wall of the large intestine (colon). The colon is where water is absorbed and stool (feces) is formed. The pouches form when the inside layer of the colon pushes through weak spots in the outer layers of the colon. You may have a few pouches or many of them. The pouches usually do not cause problems unless they become inflamed or infected. When this happens, the condition is called diverticulitis. What are the causes? The cause of this condition is not known. What increases the risk? The following factors may make you more likely to develop this condition:  Being older than age 72. Your risk for this condition increases with age. Diverticulosis is rare among people younger than age 9. By age 66, many people have it.  Eating a low-fiber diet.  Having frequent constipation.  Being overweight.  Not getting enough exercise.  Smoking.  Taking over-the-counter pain medicines, like aspirin and ibuprofen.  Having a family history of diverticulosis. What are the signs or symptoms? In most people, there are no symptoms of this condition. If you do have symptoms, they may include:  Bloating.  Cramps in the abdomen.  Constipation or diarrhea.  Pain in the lower left side of the abdomen. How is this diagnosed? Because diverticulosis usually has no symptoms, it is most often diagnosed during an exam for other colon problems. The condition may be diagnosed by:  Using a flexible scope to examine the colon  (colonoscopy).  Taking an X-ray of the colon after dye has been put into the colon (barium enema).  Having a CT scan. How is this treated? You may not need treatment for this condition. Your health care provider may recommend treatment to prevent problems. You may need treatment if you have symptoms or if you previously had diverticulitis. Treatment may include:  Eating a high-fiber diet.  Taking a fiber supplement.  Taking a live bacteria supplement (probiotic).  Taking medicine to relax your colon. Follow these instructions at home: Medicines  Take over-the-counter and prescription medicines only as told by your health care provider.  If told by your health care provider, take a fiber supplement or probiotic. Constipation prevention Your condition may cause constipation. To prevent or treat constipation, you may need to:  Drink enough fluid to keep your urine pale yellow.  Take over-the-counter or prescription medicines.  Eat foods that are high in fiber, such as beans, whole grains, and fresh fruits and vegetables.  Limit foods that are high in  fat and processed sugars, such as fried or sweet foods.  General instructions  Try not to strain when you have a bowel movement.  Keep all follow-up visits as told by your health care provider. This is important. Contact a health care provider if you:  Have pain in your abdomen.  Have bloating.  Have cramps.  Have not had a bowel movement in 3 days. Get help right away if:  Your pain gets worse.  Your bloating becomes very bad.  You have a fever or chills, and your symptoms suddenly get worse.  You vomit.  You have bowel movements that are bloody or black.  You have bleeding from your rectum. Summary  Diverticulosis is a condition that develops when small pouches (diverticula) form in the wall of the large intestine (colon).  You may have a few pouches or many of them.  This condition is most often diagnosed  during an exam for other colon problems.  Treatment may include increasing the fiber in your diet, taking supplements, or taking medicines. This information is not intended to replace advice given to you by your health care provider. Make sure you discuss any questions you have with your health care provider. Document Revised: 09/11/2018 Document Reviewed: 09/11/2018 Elsevier Patient Education  [employer].   Diverticulosis information provided  I do not recommend a future colonoscopy unless new symptoms develop  Your stomach was inflamed-biopsies were taken  Further recommendations to follow in the near future  At patient request, spoke to [place] at [phone]

## 2020-01-29 NOTE — H&P (Signed)
@LOGO @   Primary Care Physician:  Glenda Chroman, MD Primary Gastroenterologist:  Dr. Gala Romney  Pre-Procedure History & Physical: HPI:  Bethany [name] is a 74 y.o. female here for screening colonoscopy.  Does have a history of chronic anemia on iron supplementation.  She is devoid of any lower GI tract symptoms.  No longer anticoagulated.  Plan today is a screening colonoscopy consider EGD if colonoscopy unrevealing.  Past Medical History:  Diagnosis Date  . Anemia    on oral iron  . Atrial fibrillation ([place])   . CHF (congestive heart failure) ([place])   . COPD (chronic obstructive pulmonary disease) ([place])   . Diabetes mellitus without complication ([place])   . DVT (deep venous thrombosis) ([place])   . Enlarged heart   . Gangrene ([place])   . GERD (gastroesophageal reflux disease)   . History of kidney stones   . Hypercholesteremia   . Hypertension   . Insomnia   . Legally blind in left eye, as defined in [place]    Decreased vision in her right eye as well with limited ability to see.   . Stroke ([place])   . Type 2 diabetes mellitus without complication ([place]) 38/02/8297    Past Surgical History:  Procedure Laterality Date  . bilareral tubal ligation    . CATARACT EXTRACTION, BILATERAL    . EYE SURGERY    . FRACTURE SURGERY Right 2005   Fx  leg  . GLAUCOMA SURGERY Right 2014  . removal of sweat glands     per pt. 30 years ago  . TIBIA FRACTURE SURGERY Right 2010  . TUBAL LIGATION      Prior to Admission medications   Medication Sig Start Date End Date Taking? Authorizing Provider  amLODipine (NORVASC) 2.5 MG tablet Take 2.5 mg by mouth daily.   Yes [provider]  aspirin EC 81 MG tablet Take 81 mg by mouth daily. Swallow whole.   Yes [provider]  atorvastatin (LIPITOR) 10 MG tablet Take 10 mg by mouth daily.   Yes [provider]  b complex vitamins capsule Take 1 capsule by mouth daily.   Yes [provider]  brimonidine (ALPHAGAN) 0.2 %  ophthalmic solution Place 1 drop into the right eye in the morning and at bedtime.    Yes [provider]  candesartan (ATACAND) 32 MG tablet Take 32 mg by mouth daily. 11/19/17  Yes [provider]  cetirizine (ZYRTEC) 10 MG tablet Take 10 mg by mouth daily.   Yes [provider]  diltiazem (CARDIZEM CD) 300 MG 24 hr capsule Take 300 mg by mouth daily.    Yes [provider]  dorzolamide-timolol (COSOPT) 22.3-6.8 MG/ML ophthalmic solution Place 1 drop into the right eye 2 (two) times daily. 01/01/20  Yes [provider]  furosemide (LASIX) 40 MG tablet Take 80 mg by mouth daily.    Yes [provider]  glipiZIDE (GLUCOTROL XL) 2.5 MG 24 hr tablet Take 2.5 mg by mouth in the morning and at bedtime.    Yes [provider]  hydrALAZINE (APRESOLINE) 50 MG tablet Take 50 mg by mouth 3 (three) times daily.    Yes [provider]  Iron, Ferrous Gluconate, 256 (28 FE) MG TABS Take 325 mg by mouth daily.    Yes [provider]  pantoprazole (PROTONIX) 40 MG tablet Take 40 mg by mouth daily.    Yes [provider]  Potassium Chloride ER 20 MEQ TBCR Take 30  mEq by mouth daily as needed (As needed with Lasix).    Yes [provider]  albuterol (PROVENTIL HFA;VENTOLIN HFA) 108 (90 BASE) MCG/ACT inhaler Inhale 2 puffs into the lungs every 6 (six) hours as needed for wheezing or shortness of breath.     [provider]  Blood Glucose Monitoring Suppl (FORA V30A BLOOD GLUCOSE SYSTEM) DEVI by Does not apply route.    [provider]    Allergies as of 12/23/2019 - Review Complete 12/23/2019  Allergen Reaction Noted  . Sulfa antibiotics Hives and Swelling 10/21/2013  . Sulfamethoxazole Hives 09/09/2015  . Tape Other (See Comments) 10/21/2013    Family History  Problem Relation Age of Onset  . Heart disease Mother   . Hypertension Mother   . Heart attack Mother   . Hypertension Father   .  Diabetes Daughter   . Colon cancer Neg Hx     Social History   Socioeconomic History  . Marital status: Divorced    Spouse name: Not on file  . Number of children: 3  . Years of education: 23 th   . Highest education level: Not on file  Occupational History  . Not on file  Tobacco Use  . Smoking status: Former Smoker    Quit date: 02/26/2001    Years since quitting: 18.9  . Smokeless tobacco: Never Used  Vaping Use  . Vaping Use: Never used  Substance and Sexual Activity  . Alcohol use: No    Alcohol/week: 0.0 standard drinks  . Drug use: No  . Sexual activity: Not on file  Other Topics Concern  . Not on file  Social History Narrative   Caffeine 1 cup daily.          Divorced. Home alone.  3 kids, (2 deceased).  11th grade.   Social Determinants of Health   Financial Resource Strain:   . Difficulty of Paying Living Expenses: Not on file  Food Insecurity:   . Worried About Charity fundraiser in the Last Year: Not on file  . Ran Out of Food in the Last Year: Not on file  Transportation Needs:   . Lack of Transportation (Medical): Not on file  . Lack of Transportation (Non-Medical): Not on file  Physical Activity:   . Days of Exercise per Week: Not on file  . Minutes of Exercise per Session: Not on file  Stress:   . Feeling of Stress : Not on file  Social Connections:   . Frequency of Communication with Friends and Family: Not on file  . Frequency of Social Gatherings with Friends and Family: Not on file  . Attends Religious Services: Not on file  . Active Member of Clubs or Organizations: Not on file  . Attends Archivist Meetings: Not on file  . Marital Status: Not on file  Intimate Partner Violence:   . Fear of Current or Ex-Partner: Not on file  . Emotionally Abused: Not on file  . Physically Abused: Not on file  . Sexually Abused: Not on file    Review of Systems: See HPI, otherwise negative ROS  Physical Exam: BP (!) 156/60   Pulse (!)  57   Temp 98.7 F (37.1 C) (Oral)   Resp 12   Ht 5\' 7"  (1.702 m)   Wt 101.6 kg   SpO2 99%   BMI 35.08 kg/m  General:   Alert,  Well-developed, well-nourished, pleasant and cooperative in NAD Neck:  Supple; no masses or  thyromegaly. No significant cervical adenopathy. Lungs:  Clear throughout to auscultation.   No wheezes, crackles, or rhonchi. No acute distress. Heart:  Regular rate and rhythm; no murmurs, clicks, rubs,  or gallops. Abdomen: Non-distended, normal bowel sounds.  Soft and nontender without appreciable mass or hepatosplenomegaly.  Pulses:  Normal pulses noted. Extremities:  Without clubbing or edema.  Impression/Plan: 74 year old lady here for colonoscopy and possible EGD to follow per plan. The risks, benefits, limitations, imponderables and alternatives regarding both EGD and colonoscopy have been reviewed with the patient. Questions have been answered. All parties agreeable.      Notice: This dictation was prepared with Dragon dictation along with smaller phrase technology. Any transcriptional errors that result from this process are unintentional and may not be corrected upon review.

## 2020-02-01 ENCOUNTER — Other Ambulatory Visit: Payer: Self-pay

## 2020-02-01 LAB — SURGICAL PATHOLOGY

## 2020-02-02 ENCOUNTER — Encounter: Payer: Self-pay | Admitting: Internal Medicine

## 2020-02-02 ENCOUNTER — Other Ambulatory Visit: Payer: Self-pay

## 2020-02-02 DIAGNOSIS — I739 Peripheral vascular disease, unspecified: Secondary | ICD-10-CM

## 2020-02-03 DIAGNOSIS — E1165 Type 2 diabetes mellitus with hyperglycemia: Secondary | ICD-10-CM | POA: Diagnosis not present

## 2020-02-03 DIAGNOSIS — Z299 Encounter for prophylactic measures, unspecified: Secondary | ICD-10-CM | POA: Diagnosis not present

## 2020-02-03 DIAGNOSIS — I10 Essential (primary) hypertension: Secondary | ICD-10-CM | POA: Diagnosis not present

## 2020-02-03 DIAGNOSIS — I5030 Unspecified diastolic (congestive) heart failure: Secondary | ICD-10-CM | POA: Diagnosis not present

## 2020-02-03 DIAGNOSIS — E1142 Type 2 diabetes mellitus with diabetic polyneuropathy: Secondary | ICD-10-CM | POA: Diagnosis not present

## 2020-02-03 DIAGNOSIS — D6869 Other thrombophilia: Secondary | ICD-10-CM | POA: Diagnosis not present

## 2020-02-09 ENCOUNTER — Encounter: Payer: Self-pay | Admitting: Internal Medicine

## 2020-02-09 ENCOUNTER — Ambulatory Visit: Payer: Medicare Other | Admitting: Nurse Practitioner

## 2020-02-09 NOTE — Progress Notes (Deleted)
Referring Provider: Glenda Chroman, MD Primary Care Physician:  Glenda Chroman, MD Primary GI:  Dr. Gala Romney  No chief complaint on file.   HPI:   Bethany [name] is a 74 y.o. female who presents for follow-up and to reschedule procedure.  The patient was last seen in our office 10/22/2019 for anemia.  Previously noted to be limited historian.  History of IDA, stroke, hypertension, hyperlipidemia, A. fib on warfarin, PAD, CHF with last EF of 45%, COPD, diabetes, GERD.  Has been on iron for years.  Colonoscopy in 2008 that was "normal" and Protonix manages intermittent GERD well.  Chronic kidney disease with creatinine of 1.95 prior to her last visit and anemia deemed likely multifactorial in nature.  Most recent hemoglobin in our system prior to her last visit was December 2020 and found to be 10.0.  Celiac serologies in December 2020 normal without evidence of celiac disease.  At her last visit she was doing well overall.  No overt GI complaints.  Recommended recheck CBC and iron studies, colonoscopy with possible EGD to evaluate for anemia.  CBC completed 10/22/2019 with stable hemoglobin at 11.1, normal ferritin at 174, normal iron 83.  EGD was completed 01/29/2020 which found normal esophagus, submucosal gastric petechial hemorrhage and erythema status post biopsy, normal duodenum.  Gastric biopsy found mild chronic gastritis. It does not appear colonoscopy was completed.  Today she states she is doing okay overall.   Past Medical History:  Diagnosis Date  . Anemia    on oral iron  . Atrial fibrillation ([place])   . CHF (congestive heart failure) ([place])   . COPD (chronic obstructive pulmonary disease) ([place])   . Diabetes mellitus without complication ([place])   . DVT (deep venous thrombosis) ([place])   . Enlarged heart   . Gangrene ([place])   . GERD (gastroesophageal reflux disease)   . History of kidney stones   . Hypercholesteremia   . Hypertension   . Insomnia   . Legally blind in left eye, as  defined in [place]    Decreased vision in her right eye as well with limited ability to see.   . Stroke ([place])   . Type 2 diabetes mellitus without complication ([place]) 99/09/3380    Past Surgical History:  Procedure Laterality Date  . bilareral tubal ligation    . CATARACT EXTRACTION, BILATERAL    . EYE SURGERY    . FRACTURE SURGERY Right 2005   Fx  leg  . GLAUCOMA SURGERY Right 2014  . removal of sweat glands     per pt. 30 years ago  . TIBIA FRACTURE SURGERY Right 2010  . TUBAL LIGATION      Current Outpatient Medications  Medication Sig Dispense Refill  . albuterol (PROVENTIL HFA;VENTOLIN HFA) 108 (90 BASE) MCG/ACT inhaler Inhale 2 puffs into the lungs every 6 (six) hours as needed for wheezing or shortness of breath.     Marland Kitchen amLODipine (NORVASC) 2.5 MG tablet Take 2.5 mg by mouth daily.    Marland Kitchen aspirin EC 81 MG tablet Take 81 mg by mouth daily. Swallow whole.    Marland Kitchen atorvastatin (LIPITOR) 10 MG tablet Take 10 mg by mouth daily.    Marland Kitchen b complex vitamins capsule Take 1 capsule by mouth daily.    . Blood Glucose Monitoring Suppl (FORA V30A BLOOD GLUCOSE SYSTEM) DEVI by Does not apply route.    . brimonidine (ALPHAGAN) 0.2 % ophthalmic solution Place 1 drop into the right eye in the  morning and at bedtime.     . candesartan (ATACAND) 32 MG tablet Take 32 mg by mouth daily.  3  . cetirizine (ZYRTEC) 10 MG tablet Take 10 mg by mouth daily.    Marland Kitchen diltiazem (CARDIZEM CD) 300 MG 24 hr capsule Take 300 mg by mouth daily.     . dorzolamide-timolol (COSOPT) 22.3-6.8 MG/ML ophthalmic solution Place 1 drop into the right eye 2 (two) times daily.    . furosemide (LASIX) 40 MG tablet Take 80 mg by mouth daily.     Marland Kitchen glipiZIDE (GLUCOTROL XL) 2.5 MG 24 hr tablet Take 2.5 mg by mouth in the morning and at bedtime.     . hydrALAZINE (APRESOLINE) 50 MG tablet Take 50 mg by mouth 3 (three) times daily.     . Iron, Ferrous Gluconate, 256 (28 FE) MG TABS Take 325 mg by mouth daily.     . pantoprazole (PROTONIX) 40  MG tablet Take 40 mg by mouth daily.     . Potassium Chloride ER 20 MEQ TBCR Take 30 mEq by mouth daily as needed (As needed with Lasix).      No current facility-administered medications for this visit.    Allergies as of 02/09/2020 - Review Complete 01/29/2020  Allergen Reaction Noted  . Sulfa antibiotics Hives and Swelling 10/21/2013  . Sulfamethoxazole Hives 09/09/2015  . Tape Other (See Comments) 10/21/2013    Family History  Problem Relation Age of Onset  . Heart disease Mother   . Hypertension Mother   . Heart attack Mother   . Hypertension Father   . Diabetes Daughter   . Colon cancer Neg Hx     Social History   Socioeconomic History  . Marital status: Divorced    Spouse name: Not on file  . Number of children: 3  . Years of education: 63 th   . Highest education level: Not on file  Occupational History  . Not on file  Tobacco Use  . Smoking status: Former Smoker    Quit date: 02/26/2001    Years since quitting: 18.9  . Smokeless tobacco: Never Used  Vaping Use  . Vaping Use: Never used  Substance and Sexual Activity  . Alcohol use: No    Alcohol/week: 0.0 standard drinks  . Drug use: No  . Sexual activity: Not on file  Other Topics Concern  . Not on file  Social History Narrative   Caffeine 1 cup daily.          Divorced. Home alone.  3 kids, (2 deceased).  11th grade.   Social Determinants of Health   Financial Resource Strain: Not on file  Food Insecurity: Not on file  Transportation Needs: Not on file  Physical Activity: Not on file  Stress: Not on file  Social Connections: Not on file    Subjective:*** Review of Systems  Constitutional: Negative for chills, fever, malaise/fatigue and weight loss.  HENT: Negative for congestion and sore throat.   Respiratory: Negative for cough and shortness of breath.   Cardiovascular: Negative for chest pain and palpitations.  Gastrointestinal: Negative for abdominal pain, blood in stool, diarrhea,  melena, nausea and vomiting.  Musculoskeletal: Negative for joint pain and myalgias.  Skin: Negative for rash.  Neurological: Negative for dizziness and weakness.  Endo/Heme/Allergies: Does not bruise/bleed easily.  Psychiatric/Behavioral: Negative for depression. The patient is not nervous/anxious.   All other systems reviewed and are negative.    Objective: There were no vitals taken for this visit.  Physical Exam Vitals and nursing note reviewed.  Constitutional:      General: She is not in acute distress.    Appearance: Normal appearance. She is well-developed. She is not ill-appearing, toxic-appearing or diaphoretic.  HENT:     Head: Normocephalic and atraumatic.     Nose: No congestion or rhinorrhea.  Eyes:     General: No scleral icterus. Cardiovascular:     Rate and Rhythm: Normal rate and regular rhythm.     Heart sounds: Normal heart sounds.  Pulmonary:     Effort: Pulmonary effort is normal. No respiratory distress.     Breath sounds: Normal breath sounds.  Abdominal:     General: Bowel sounds are normal.     Palpations: Abdomen is soft. There is no hepatomegaly, splenomegaly or mass.     Tenderness: There is no abdominal tenderness. There is no guarding or rebound.     Hernia: No hernia is present.  Skin:    General: Skin is warm and dry.     Coloration: Skin is not jaundiced.     Findings: No rash.  Neurological:     General: No focal deficit present.     Mental Status: She is alert and oriented to person, place, and time.  Psychiatric:        Attention and Perception: Attention normal.        Mood and Affect: Mood normal.        Speech: Speech normal.        Behavior: Behavior normal.        Thought Content: Thought content normal.        Cognition and Memory: Cognition and memory normal.      Assessment:  ***   Plan: ***    Thank you for allowing [place] to participate in the care of Charleene T Date  Walden Field, DNP, AGNP-C Adult & Gerontological  Nurse Practitioner [facility]   02/09/2020 7:44 AM   Disclaimer: This note was dictated with voice recognition software. Similar sounding words can inadvertently be transcribed and may not be corrected upon review.

## 2020-02-10 ENCOUNTER — Ambulatory Visit (INDEPENDENT_AMBULATORY_CARE_PROVIDER_SITE_OTHER): Payer: Medicare Other | Admitting: Physician Assistant

## 2020-02-10 ENCOUNTER — Ambulatory Visit (HOSPITAL_COMMUNITY)
Admission: RE | Admit: 2020-02-10 | Discharge: 2020-02-10 | Disposition: A | Discharge status: Home / Self Care | Payer: Medicare Other | Source: Ambulatory Visit | Attending: Physician Assistant | Admitting: Physician Assistant

## 2020-02-10 ENCOUNTER — Other Ambulatory Visit: Payer: Self-pay

## 2020-02-10 VITALS — BP 175/68 | HR 53 | Temp 98.7°F | Resp 20 | Ht 67.0 in | Wt 227.2 lb

## 2020-02-10 DIAGNOSIS — I739 Peripheral vascular disease, unspecified: Secondary | ICD-10-CM | POA: Diagnosis not present

## 2020-02-10 NOTE — Progress Notes (Signed)
[facility] [place] HISTORY AND PHYSICAL   History of Present Illness:  Patient is a 74 y.o. year old female who presents for evaluation of peripheral arterial diease.    She was noted to have an abnormal non invasive arterial study on screening July 2015.  She is asymptomatic. She denies claudication rest pain or non healing wounds. Other medical problems include hypertension, diabetes, elevated cholesterol, COPD, afib.  The patient is no longer on anticoagulation.    She does walk around in her home independently and around the stores occasionally holding to a cart, allthough she is legally blind.      Past Medical History:  Diagnosis Date  . Anemia    on oral iron  . Atrial fibrillation ([place])   . CHF (congestive heart failure) ([place])   . COPD (chronic obstructive pulmonary disease) ([place])   . Diabetes mellitus without complication ([place])   . DVT (deep venous thrombosis) ([place])   . Enlarged heart   . Gangrene ([place])   . GERD (gastroesophageal reflux disease)   . History of kidney stones   . Hypercholesteremia   . Hypertension   . Insomnia   . Legally blind in left eye, as defined in [place]    Decreased vision in her right eye as well with limited ability to see.   . Stroke ([place])   . Type 2 diabetes mellitus without complication ([place]) 62/08/348    Past Surgical History:  Procedure Laterality Date  . bilareral tubal ligation    . CATARACT EXTRACTION, BILATERAL    . EYE SURGERY    . FRACTURE SURGERY Right 2005   Fx  leg  . GLAUCOMA SURGERY Right 2014  . removal of sweat glands     per pt. 30 years ago  . TIBIA FRACTURE SURGERY Right 2010  . TUBAL LIGATION      ROS:   General:  No weight loss, Fever, chills  HEENT: No recent headaches, no nasal bleeding, no visual changes, no sore throat  Neurologic: No dizziness, blackouts, seizures. No recent symptoms of stroke or mini- stroke. No recent episodes of slurred speech, or temporary blindness.  Cardiac: No  recent episodes of chest pain/pressure, no shortness of breath at rest.  No shortness of breath with exertion.  Denies history of atrial fibrillation or irregular heartbeat  Vascular: No history of rest pain in feet.  No history of claudication.  No history of non-healing ulcer, No history of DVT   Pulmonary: No home oxygen, no productive cough, no hemoptysis,  No asthma or wheezing  Musculoskeletal:  [ ]  Arthritis, [ ]  Low back pain,  [ ]  Joint pain  Hematologic:No history of hypercoagulable state.  No history of easy bleeding.  No history of anemia  Gastrointestinal: No hematochezia or melena,  No gastroesophageal reflux, no trouble swallowing  Urinary: [ ]  chronic Kidney disease, [ ]  on HD - [ ]  MWF or [ ]  TTHS, [ ]  Burning with urination, [ ]  Frequent urination, [ ]  Difficulty urinating;   Skin: No rashes  Psychological: No history of anxiety,  No history of depression  Social History Social History   Tobacco Use  . Smoking status: Former Smoker    Quit date: 02/26/2001    Years since quitting: 18.9  . Smokeless tobacco: Never Used  Vaping Use  . Vaping Use: Never used  Substance Use Topics  . Alcohol use: No    Alcohol/week: 0.0 standard drinks  . Drug use: No  Family History Family History  Problem Relation Age of Onset  . Heart disease Mother   . Hypertension Mother   . Heart attack Mother   . Hypertension Father   . Diabetes Daughter   . Colon cancer Neg Hx     Allergies  Allergies  Allergen Reactions  . Sulfa Antibiotics Hives and Swelling  . Sulfamethoxazole Hives  . Tape Other (See Comments)    Pulls skin off. Skin irritation and breakdown     Current Outpatient Medications  Medication Sig Dispense Refill  . albuterol (PROVENTIL HFA;VENTOLIN HFA) 108 (90 BASE) MCG/ACT inhaler Inhale 2 puffs into the lungs every 6 (six) hours as needed for wheezing or shortness of breath.     Marland Kitchen amLODipine (NORVASC) 2.5 MG tablet Take 2.5 mg by mouth daily.    Marland Kitchen  aspirin EC 81 MG tablet Take 81 mg by mouth daily. Swallow whole.    Marland Kitchen atorvastatin (LIPITOR) 10 MG tablet Take 10 mg by mouth daily.    Marland Kitchen b complex vitamins capsule Take 1 capsule by mouth daily.    . Blood Glucose Monitoring Suppl (FORA V30A BLOOD GLUCOSE SYSTEM) DEVI by Does not apply route.    . brimonidine (ALPHAGAN) 0.2 % ophthalmic solution Place 1 drop into the right eye in the morning and at bedtime.     . candesartan (ATACAND) 32 MG tablet Take 32 mg by mouth daily.  3  . cetirizine (ZYRTEC) 10 MG tablet Take 10 mg by mouth daily.    Marland Kitchen diltiazem (CARDIZEM CD) 300 MG 24 hr capsule Take 300 mg by mouth daily.     . dorzolamide-timolol (COSOPT) 22.3-6.8 MG/ML ophthalmic solution Place 1 drop into the right eye 2 (two) times daily.    . furosemide (LASIX) 40 MG tablet Take 80 mg by mouth daily.     Marland Kitchen glipiZIDE (GLUCOTROL XL) 2.5 MG 24 hr tablet Take 2.5 mg by mouth in the morning and at bedtime.     . hydrALAZINE (APRESOLINE) 50 MG tablet Take 50 mg by mouth 3 (three) times daily.     . Iron, Ferrous Gluconate, 256 (28 FE) MG TABS Take 325 mg by mouth daily.     . pantoprazole (PROTONIX) 40 MG tablet Take 40 mg by mouth daily.     . Potassium Chloride ER 20 MEQ TBCR Take 30 mEq by mouth daily as needed (As needed with Lasix).      No current facility-administered medications for this visit.    Physical Examination  Vitals:   02/10/20 1046  BP: (!) 175/68  Pulse: (!) 53  Resp: 20  Temp: 98.7 F (37.1 C)  TempSrc: Temporal  SpO2: 97%  Weight: 227 lb 3.2 oz (103.1 kg)  Height: 5\' 7"  (1.702 m)    Body mass index is 35.58 kg/m.  General:  Alert and oriented, no acute distress HEENT: Normal Neck: No bruit or JVD Pulmonary: Clear to auscultation bilaterally Cardiac: Regular Rate and Rhythm without murmur Abdomen: Soft, non-tender, non-distended, no mass, no scars Skin: No rash Extremity Pulses:  2+ radial, brachial, femoral, no dorsalis pedis, posterior tibial pulses  bilaterally Musculoskeletal:Right LE edema  Neurologic: Upper and lower extremity motor 5/5 and symmetric  DATA:     ABI Findings:  +---------+------------------+-----+--------+--------+  Right  Rt Pressure (mmHg)IndexWaveformComment   +---------+------------------+-----+--------+--------+  Brachial 182                     +---------+------------------+-----+--------+--------+  PTA   170  0.93 biphasic      +---------+------------------+-----+--------+--------+  DP    171        0.94 biphasic      +---------+------------------+-----+--------+--------+  Great Toe100        0.55           +---------+------------------+-----+--------+--------+   +---------+------------------+-----+----------+-------+  Left   Lt Pressure (mmHg)IndexWaveform Comment  +---------+------------------+-----+----------+-------+  Brachial 181                      +---------+------------------+-----+----------+-------+  PTA   151        0.83 monophasic      +---------+------------------+-----+----------+-------+  DP    140        0.77 biphasic       +---------+------------------+-----+----------+-------+  Great Toe73        0.40            +---------+------------------+-----+----------+-------+   +-------+-----------+-----------+------------+------------+  ABI/TBIToday's ABIToday's TBIPrevious ABIPrevious TBI  +-------+-----------+-----------+------------+------------+  Right 0.94    0.55    0.89    0.86      +-------+-----------+-----------+------------+------------+  Left  0.83    0.40    0.94    0.62      +-------+-----------+-----------+------------+------------+   Summary:  Right: Resting right ankle-brachial index indicates mild right lower  extremity arterial  disease. The right toe-brachial index is abnormal.   Left: Resting left ankle-brachial index indicates mild left lower  extremity arterial disease. The left toe-brachial index is abnormal.   ASSESSMENT:  Stable arterial peripheral di seas without significant change on ABI.  She remains asymptomatic for claudication, rest pain or non healing wounds.     PLAN: Continue to stay as active as possible with daily ambulation.  Have family members check LE skin for non healing wounds.  F/U in 1 year for repeat ABI study.  She will wear her compression on the right LE for edema control.   Roxy Horseman PA-C [facility] [place] Office: [phone]  MD in clinic [place]

## 2020-02-16 DIAGNOSIS — N1832 Chronic kidney disease, stage 3b: Secondary | ICD-10-CM | POA: Diagnosis not present

## 2020-02-17 NOTE — Op Note (Signed)
[facility] Patient Name: Bethany [name] Procedure Date: 01/29/2020 9:24 AM MRN: [id number] Date of Birth: 1945-07-22 Attending MD: Norvel Richards , MD CSN: [id number] Age: 74 Admit Type: Outpatient Procedure:                Colonoscopy Indications:              Unexplained iron deficiency anemia Providers:                Norvel Richards, MD, Lambert Mody, Tammy                            Vaught, RN, Aram Candela Referring MD:              Medicines:                Midazolam 9 mg IV, Meperidine 50 mg IV Complications:            No immediate complications. Estimated Blood Loss:     Estimated blood loss: none. Procedure:                Pre-Anesthesia Assessment:                           - Prior to the procedure, a History and Physical                            was performed, and patient medications and                            allergies were reviewed. The patient's tolerance of                            previous anesthesia was also reviewed. The risks                            and benefits of the procedure and the sedation                            options and risks were discussed with the patient.                            All questions were answered, and informed consent                            was obtained. Prior Anticoagulants: The patient has                            taken no previous anticoagulant or antiplatelet                            agents. ASA Grade Assessment: III - A patient with                            severe systemic disease. After reviewing the risks  and benefits, the patient was deemed in                            satisfactory condition to undergo the procedure.                           After obtaining informed consent, the colonoscope                            was passed under direct vision. Throughout the                            procedure, the patient's blood pressure, pulse, and                             oxygen saturations were monitored continuously. The                            CF-HQ190L ([id number]) scope was introduced through                            the anus and advanced to the the cecum, identified                            by appendiceal orifice and ileocecal valve. The                            colonoscopy was performed without difficulty. The                            patient tolerated the procedure well. The quality                            of the bowel preparation was adequate. Scope In: 9:42:03 AM Scope Out: 10:30:55 AM Scope Withdrawal Time: 0 hours 8 minutes 4 seconds  Total Procedure Duration: 0 hours 48 minutes 52 seconds  Findings:      Scattered medium-mouthed diverticula were found in the sigmoid colon and       descending colon. Elongated, redundant colon. This required multiple       changes in the patient's position and external abdominal pressure to       reach the cecum.      The exam was otherwise without abnormality on direct and retroflexion       views. Impression:               - Diverticulosis in the sigmoid colon and in the                            descending colon. [place] redundant colon                           - The examination was otherwise normal on direct                            and  retroflexion views.                           - No specimens collected. Moderate Sedation:      Moderate (conscious) sedation was administered by the endoscopy nurse       and supervised by the endoscopist. The following parameters were       monitored: oxygen saturation, heart rate, blood pressure, respiratory       rate, EKG, adequacy of pulmonary ventilation, and response to care.       Total physician intraservice time was 55 minutes. Recommendation:           - Patient has a contact number available for                            emergencies. The signs and symptoms of potential                            delayed complications were discussed  with the                            patient. Return to normal activities tomorrow.                            Written discharge instructions were provided to the                            patient.                           - Resume previous diet.                           - Continue present medications.                           - Patient has a contact number available for                            emergencies. The signs and symptoms of potential                            delayed complications were discussed with the                            patient. Return to normal activities tomorrow.                            Written discharge instructions were provided to the                            patient.                           - No repeat colonoscopy due to age.                           -  Return to GI clinic (date not yet determined).                            See EGD report. Procedure Code(s):        --- Professional ---                           [phone], Colonoscopy, flexible; diagnostic, including                            collection of specimen(s) by brushing or washing,                            when performed (separate procedure)                           99153, Moderate sedation; each additional 15                            minutes intraservice time                           99153, Moderate sedation; each additional 15                            minutes intraservice time                           99153, Moderate sedation; each additional 15                            minutes intraservice time                           G0500, Moderate sedation services provided by the                            same physician or other qualified health care                            professional performing a gastrointestinal                            endoscopic service that sedation supports,                            requiring the presence of an independent trained                             observer to assist in the monitoring of the                            patient's level of consciousness and physiological                            status; initial 15 minutes of intra-service time;  patient age 15 years or older (additional time may                            be reported with [phone], as appropriate) Diagnosis Code(s):        --- Professional ---                           D50.9, Iron deficiency anemia, unspecified                           K57.30, Diverticulosis of large intestine without                            perforation or abscess without bleeding CPT copyright 2019 [employer]. All rights reserved. The codes documented in this report are preliminary and upon coder review may  be revised to meet current compliance requirements. Cristopher Estimable. Aundray Cartlidge, MD Norvel Richards, MD 02/17/2020 8:32:33 AM This report has been signed electronically. Number of Addenda: 0

## 2020-02-23 ENCOUNTER — Encounter (HOSPITAL_COMMUNITY): Payer: Self-pay | Admitting: Internal Medicine

## 2020-02-25 DIAGNOSIS — I10 Essential (primary) hypertension: Secondary | ICD-10-CM | POA: Diagnosis not present

## 2020-02-25 DIAGNOSIS — M159 Polyosteoarthritis, unspecified: Secondary | ICD-10-CM | POA: Diagnosis not present

## 2020-02-25 DIAGNOSIS — I509 Heart failure, unspecified: Secondary | ICD-10-CM | POA: Diagnosis not present

## 2020-02-25 DIAGNOSIS — E119 Type 2 diabetes mellitus without complications: Secondary | ICD-10-CM | POA: Diagnosis not present

## 2020-03-01 DIAGNOSIS — I10 Essential (primary) hypertension: Secondary | ICD-10-CM | POA: Diagnosis not present

## 2020-03-01 DIAGNOSIS — I5032 Chronic diastolic (congestive) heart failure: Secondary | ICD-10-CM | POA: Diagnosis not present

## 2020-03-01 DIAGNOSIS — I4821 Permanent atrial fibrillation: Secondary | ICD-10-CM | POA: Diagnosis not present

## 2020-03-03 DIAGNOSIS — J449 Chronic obstructive pulmonary disease, unspecified: Secondary | ICD-10-CM | POA: Diagnosis not present

## 2020-03-07 DIAGNOSIS — I509 Heart failure, unspecified: Secondary | ICD-10-CM | POA: Diagnosis not present

## 2020-03-07 DIAGNOSIS — I480 Paroxysmal atrial fibrillation: Secondary | ICD-10-CM | POA: Diagnosis not present

## 2020-03-07 DIAGNOSIS — N179 Acute kidney failure, unspecified: Secondary | ICD-10-CM | POA: Diagnosis not present

## 2020-03-07 DIAGNOSIS — N1832 Chronic kidney disease, stage 3b: Secondary | ICD-10-CM | POA: Diagnosis not present

## 2020-03-07 DIAGNOSIS — I739 Peripheral vascular disease, unspecified: Secondary | ICD-10-CM | POA: Diagnosis not present

## 2020-03-07 DIAGNOSIS — R809 Proteinuria, unspecified: Secondary | ICD-10-CM | POA: Diagnosis not present

## 2020-03-07 DIAGNOSIS — E1122 Type 2 diabetes mellitus with diabetic chronic kidney disease: Secondary | ICD-10-CM | POA: Diagnosis not present

## 2020-03-07 DIAGNOSIS — I129 Hypertensive chronic kidney disease with stage 1 through stage 4 chronic kidney disease, or unspecified chronic kidney disease: Secondary | ICD-10-CM | POA: Diagnosis not present

## 2020-03-07 DIAGNOSIS — D631 Anemia in chronic kidney disease: Secondary | ICD-10-CM | POA: Diagnosis not present

## 2020-03-07 DIAGNOSIS — N2581 Secondary hyperparathyroidism of renal origin: Secondary | ICD-10-CM | POA: Diagnosis not present

## 2020-03-08 DIAGNOSIS — E1142 Type 2 diabetes mellitus with diabetic polyneuropathy: Secondary | ICD-10-CM | POA: Diagnosis not present

## 2020-03-08 DIAGNOSIS — J449 Chronic obstructive pulmonary disease, unspecified: Secondary | ICD-10-CM | POA: Diagnosis not present

## 2020-03-08 DIAGNOSIS — E1165 Type 2 diabetes mellitus with hyperglycemia: Secondary | ICD-10-CM | POA: Diagnosis not present

## 2020-03-08 DIAGNOSIS — Z299 Encounter for prophylactic measures, unspecified: Secondary | ICD-10-CM | POA: Diagnosis not present

## 2020-03-08 DIAGNOSIS — I10 Essential (primary) hypertension: Secondary | ICD-10-CM | POA: Diagnosis not present

## 2020-03-08 DIAGNOSIS — I5030 Unspecified diastolic (congestive) heart failure: Secondary | ICD-10-CM | POA: Diagnosis not present

## 2020-03-10 DIAGNOSIS — F17210 Nicotine dependence, cigarettes, uncomplicated: Secondary | ICD-10-CM | POA: Diagnosis not present

## 2020-03-10 DIAGNOSIS — Z882 Allergy status to sulfonamides status: Secondary | ICD-10-CM | POA: Diagnosis not present

## 2020-03-10 DIAGNOSIS — I4891 Unspecified atrial fibrillation: Secondary | ICD-10-CM | POA: Diagnosis not present

## 2020-03-10 DIAGNOSIS — I517 Cardiomegaly: Secondary | ICD-10-CM | POA: Diagnosis not present

## 2020-03-10 DIAGNOSIS — R0600 Dyspnea, unspecified: Secondary | ICD-10-CM | POA: Diagnosis not present

## 2020-03-10 DIAGNOSIS — I739 Peripheral vascular disease, unspecified: Secondary | ICD-10-CM | POA: Diagnosis not present

## 2020-03-10 DIAGNOSIS — R0602 Shortness of breath: Secondary | ICD-10-CM | POA: Diagnosis not present

## 2020-03-10 DIAGNOSIS — E119 Type 2 diabetes mellitus without complications: Secondary | ICD-10-CM | POA: Diagnosis not present

## 2020-03-10 DIAGNOSIS — K219 Gastro-esophageal reflux disease without esophagitis: Secondary | ICD-10-CM | POA: Diagnosis not present

## 2020-03-10 DIAGNOSIS — Z743 Need for continuous supervision: Secondary | ICD-10-CM | POA: Diagnosis not present

## 2020-03-10 DIAGNOSIS — R069 Unspecified abnormalities of breathing: Secondary | ICD-10-CM | POA: Diagnosis not present

## 2020-03-10 DIAGNOSIS — Z79899 Other long term (current) drug therapy: Secondary | ICD-10-CM | POA: Diagnosis not present

## 2020-03-10 DIAGNOSIS — J449 Chronic obstructive pulmonary disease, unspecified: Secondary | ICD-10-CM | POA: Diagnosis not present

## 2020-03-10 DIAGNOSIS — Z7982 Long term (current) use of aspirin: Secondary | ICD-10-CM | POA: Diagnosis not present

## 2020-03-10 DIAGNOSIS — N189 Chronic kidney disease, unspecified: Secondary | ICD-10-CM | POA: Diagnosis not present

## 2020-03-10 DIAGNOSIS — R6889 Other general symptoms and signs: Secondary | ICD-10-CM | POA: Diagnosis not present

## 2020-03-24 DIAGNOSIS — H401133 Primary open-angle glaucoma, bilateral, severe stage: Secondary | ICD-10-CM | POA: Diagnosis not present

## 2020-03-24 DIAGNOSIS — H35371 Puckering of macula, right eye: Secondary | ICD-10-CM | POA: Diagnosis not present

## 2020-03-28 DIAGNOSIS — J449 Chronic obstructive pulmonary disease, unspecified: Secondary | ICD-10-CM | POA: Diagnosis not present

## 2020-05-03 DIAGNOSIS — J449 Chronic obstructive pulmonary disease, unspecified: Secondary | ICD-10-CM | POA: Diagnosis not present

## 2020-05-13 DIAGNOSIS — I509 Heart failure, unspecified: Secondary | ICD-10-CM | POA: Diagnosis not present

## 2020-05-13 DIAGNOSIS — J449 Chronic obstructive pulmonary disease, unspecified: Secondary | ICD-10-CM | POA: Diagnosis not present

## 2020-05-13 DIAGNOSIS — I5030 Unspecified diastolic (congestive) heart failure: Secondary | ICD-10-CM | POA: Diagnosis not present

## 2020-05-13 DIAGNOSIS — Z299 Encounter for prophylactic measures, unspecified: Secondary | ICD-10-CM | POA: Diagnosis not present

## 2020-05-13 DIAGNOSIS — I10 Essential (primary) hypertension: Secondary | ICD-10-CM | POA: Diagnosis not present

## 2020-05-13 DIAGNOSIS — E1165 Type 2 diabetes mellitus with hyperglycemia: Secondary | ICD-10-CM | POA: Diagnosis not present

## 2020-05-25 DIAGNOSIS — J449 Chronic obstructive pulmonary disease, unspecified: Secondary | ICD-10-CM | POA: Diagnosis not present

## 2020-05-31 DIAGNOSIS — I5032 Chronic diastolic (congestive) heart failure: Secondary | ICD-10-CM | POA: Diagnosis not present

## 2020-05-31 DIAGNOSIS — I4821 Permanent atrial fibrillation: Secondary | ICD-10-CM | POA: Diagnosis not present

## 2020-05-31 DIAGNOSIS — I10 Essential (primary) hypertension: Secondary | ICD-10-CM | POA: Diagnosis not present

## 2020-06-06 DIAGNOSIS — Z7189 Other specified counseling: Secondary | ICD-10-CM | POA: Diagnosis not present

## 2020-06-06 DIAGNOSIS — N189 Chronic kidney disease, unspecified: Secondary | ICD-10-CM | POA: Diagnosis not present

## 2020-06-06 DIAGNOSIS — I129 Hypertensive chronic kidney disease with stage 1 through stage 4 chronic kidney disease, or unspecified chronic kidney disease: Secondary | ICD-10-CM | POA: Diagnosis not present

## 2020-06-06 DIAGNOSIS — Z0000 Encounter for general adult medical examination without abnormal findings: Secondary | ICD-10-CM | POA: Diagnosis not present

## 2020-06-06 DIAGNOSIS — R809 Proteinuria, unspecified: Secondary | ICD-10-CM | POA: Diagnosis not present

## 2020-06-06 DIAGNOSIS — D631 Anemia in chronic kidney disease: Secondary | ICD-10-CM | POA: Diagnosis not present

## 2020-06-06 DIAGNOSIS — I509 Heart failure, unspecified: Secondary | ICD-10-CM | POA: Diagnosis not present

## 2020-06-06 DIAGNOSIS — Z79899 Other long term (current) drug therapy: Secondary | ICD-10-CM | POA: Diagnosis not present

## 2020-06-06 DIAGNOSIS — I10 Essential (primary) hypertension: Secondary | ICD-10-CM | POA: Diagnosis not present

## 2020-06-06 DIAGNOSIS — R5383 Other fatigue: Secondary | ICD-10-CM | POA: Diagnosis not present

## 2020-06-06 DIAGNOSIS — N179 Acute kidney failure, unspecified: Secondary | ICD-10-CM | POA: Diagnosis not present

## 2020-06-06 DIAGNOSIS — Z789 Other specified health status: Secondary | ICD-10-CM | POA: Diagnosis not present

## 2020-06-06 DIAGNOSIS — N2581 Secondary hyperparathyroidism of renal origin: Secondary | ICD-10-CM | POA: Diagnosis not present

## 2020-06-06 DIAGNOSIS — N1832 Chronic kidney disease, stage 3b: Secondary | ICD-10-CM | POA: Diagnosis not present

## 2020-06-06 DIAGNOSIS — I5030 Unspecified diastolic (congestive) heart failure: Secondary | ICD-10-CM | POA: Diagnosis not present

## 2020-06-06 DIAGNOSIS — I480 Paroxysmal atrial fibrillation: Secondary | ICD-10-CM | POA: Diagnosis not present

## 2020-06-06 DIAGNOSIS — I739 Peripheral vascular disease, unspecified: Secondary | ICD-10-CM | POA: Diagnosis not present

## 2020-06-06 DIAGNOSIS — E1122 Type 2 diabetes mellitus with diabetic chronic kidney disease: Secondary | ICD-10-CM | POA: Diagnosis not present

## 2020-06-06 DIAGNOSIS — E7800 Pure hypercholesterolemia, unspecified: Secondary | ICD-10-CM | POA: Diagnosis not present

## 2020-06-06 DIAGNOSIS — Z299 Encounter for prophylactic measures, unspecified: Secondary | ICD-10-CM | POA: Diagnosis not present

## 2020-07-01 DIAGNOSIS — J449 Chronic obstructive pulmonary disease, unspecified: Secondary | ICD-10-CM | POA: Diagnosis not present

## 2020-07-20 DIAGNOSIS — J449 Chronic obstructive pulmonary disease, unspecified: Secondary | ICD-10-CM | POA: Diagnosis not present

## 2020-08-15 DIAGNOSIS — H35371 Puckering of macula, right eye: Secondary | ICD-10-CM | POA: Diagnosis not present

## 2020-08-15 DIAGNOSIS — H401133 Primary open-angle glaucoma, bilateral, severe stage: Secondary | ICD-10-CM | POA: Diagnosis not present

## 2020-08-22 DIAGNOSIS — N184 Chronic kidney disease, stage 4 (severe): Secondary | ICD-10-CM | POA: Diagnosis not present

## 2020-08-22 DIAGNOSIS — I509 Heart failure, unspecified: Secondary | ICD-10-CM | POA: Diagnosis not present

## 2020-08-22 DIAGNOSIS — Z299 Encounter for prophylactic measures, unspecified: Secondary | ICD-10-CM | POA: Diagnosis not present

## 2020-08-22 DIAGNOSIS — I10 Essential (primary) hypertension: Secondary | ICD-10-CM | POA: Diagnosis not present

## 2020-08-22 DIAGNOSIS — E1122 Type 2 diabetes mellitus with diabetic chronic kidney disease: Secondary | ICD-10-CM | POA: Diagnosis not present

## 2020-08-22 DIAGNOSIS — E1165 Type 2 diabetes mellitus with hyperglycemia: Secondary | ICD-10-CM | POA: Diagnosis not present

## 2020-08-22 DIAGNOSIS — D649 Anemia, unspecified: Secondary | ICD-10-CM | POA: Diagnosis not present

## 2020-08-25 DIAGNOSIS — J449 Chronic obstructive pulmonary disease, unspecified: Secondary | ICD-10-CM | POA: Diagnosis not present

## 2020-08-25 DIAGNOSIS — K219 Gastro-esophageal reflux disease without esophagitis: Secondary | ICD-10-CM | POA: Diagnosis not present

## 2020-08-25 DIAGNOSIS — M1710 Unilateral primary osteoarthritis, unspecified knee: Secondary | ICD-10-CM | POA: Diagnosis not present

## 2020-08-25 DIAGNOSIS — E1165 Type 2 diabetes mellitus with hyperglycemia: Secondary | ICD-10-CM | POA: Diagnosis not present

## 2020-08-28 ENCOUNTER — Emergency Department (HOSPITAL_COMMUNITY)
Admission: EM | Admit: 2020-08-28 | Discharge: 2020-08-28 | Disposition: A | Discharge status: Home / Self Care | Payer: Medicare Other | Source: Home / Self Care | Attending: Emergency Medicine | Admitting: Emergency Medicine

## 2020-08-28 ENCOUNTER — Emergency Department (HOSPITAL_COMMUNITY): Payer: Medicare Other

## 2020-08-28 ENCOUNTER — Other Ambulatory Visit: Payer: Self-pay

## 2020-08-28 ENCOUNTER — Encounter (HOSPITAL_COMMUNITY): Payer: Self-pay

## 2020-08-28 DIAGNOSIS — E875 Hyperkalemia: Secondary | ICD-10-CM

## 2020-08-28 DIAGNOSIS — S52601A Unspecified fracture of lower end of right ulna, initial encounter for closed fracture: Secondary | ICD-10-CM

## 2020-08-28 DIAGNOSIS — W01198A Fall on same level from slipping, tripping and stumbling with subsequent striking against other object, initial encounter: Secondary | ICD-10-CM | POA: Insufficient documentation

## 2020-08-28 DIAGNOSIS — Z743 Need for continuous supervision: Secondary | ICD-10-CM | POA: Diagnosis not present

## 2020-08-28 DIAGNOSIS — Z8673 Personal history of transient ischemic attack (TIA), and cerebral infarction without residual deficits: Secondary | ICD-10-CM | POA: Insufficient documentation

## 2020-08-28 DIAGNOSIS — S42321A Displaced transverse fracture of shaft of humerus, right arm, initial encounter for closed fracture: Secondary | ICD-10-CM | POA: Diagnosis not present

## 2020-08-28 DIAGNOSIS — E7800 Pure hypercholesterolemia, unspecified: Secondary | ICD-10-CM | POA: Insufficient documentation

## 2020-08-28 DIAGNOSIS — R6889 Other general symptoms and signs: Secondary | ICD-10-CM | POA: Diagnosis not present

## 2020-08-28 DIAGNOSIS — S42351A Displaced comminuted fracture of shaft of humerus, right arm, initial encounter for closed fracture: Secondary | ICD-10-CM | POA: Diagnosis not present

## 2020-08-28 DIAGNOSIS — Z7982 Long term (current) use of aspirin: Secondary | ICD-10-CM | POA: Insufficient documentation

## 2020-08-28 DIAGNOSIS — S52501A Unspecified fracture of the lower end of right radius, initial encounter for closed fracture: Secondary | ICD-10-CM | POA: Insufficient documentation

## 2020-08-28 DIAGNOSIS — Y93G1 Activity, food preparation and clean up: Secondary | ICD-10-CM | POA: Insufficient documentation

## 2020-08-28 DIAGNOSIS — J449 Chronic obstructive pulmonary disease, unspecified: Secondary | ICD-10-CM | POA: Insufficient documentation

## 2020-08-28 DIAGNOSIS — Z85038 Personal history of other malignant neoplasm of large intestine: Secondary | ICD-10-CM | POA: Insufficient documentation

## 2020-08-28 DIAGNOSIS — M25511 Pain in right shoulder: Secondary | ICD-10-CM | POA: Diagnosis not present

## 2020-08-28 DIAGNOSIS — S42324A Nondisplaced transverse fracture of shaft of humerus, right arm, initial encounter for closed fracture: Secondary | ICD-10-CM | POA: Diagnosis not present

## 2020-08-28 DIAGNOSIS — E1169 Type 2 diabetes mellitus with other specified complication: Secondary | ICD-10-CM | POA: Insufficient documentation

## 2020-08-28 DIAGNOSIS — E1151 Type 2 diabetes mellitus with diabetic peripheral angiopathy without gangrene: Secondary | ICD-10-CM | POA: Insufficient documentation

## 2020-08-28 DIAGNOSIS — I10 Essential (primary) hypertension: Secondary | ICD-10-CM | POA: Diagnosis not present

## 2020-08-28 DIAGNOSIS — S0990XA Unspecified injury of head, initial encounter: Secondary | ICD-10-CM | POA: Insufficient documentation

## 2020-08-28 DIAGNOSIS — Z79899 Other long term (current) drug therapy: Secondary | ICD-10-CM | POA: Insufficient documentation

## 2020-08-28 DIAGNOSIS — M7989 Other specified soft tissue disorders: Secondary | ICD-10-CM | POA: Diagnosis not present

## 2020-08-28 DIAGNOSIS — S42334A Nondisplaced oblique fracture of shaft of humerus, right arm, initial encounter for closed fracture: Secondary | ICD-10-CM | POA: Diagnosis not present

## 2020-08-28 DIAGNOSIS — E1136 Type 2 diabetes mellitus with diabetic cataract: Secondary | ICD-10-CM | POA: Insufficient documentation

## 2020-08-28 DIAGNOSIS — E1139 Type 2 diabetes mellitus with other diabetic ophthalmic complication: Secondary | ICD-10-CM | POA: Insufficient documentation

## 2020-08-28 LAB — CBC WITH DIFFERENTIAL/PLATELET
Abs Immature Granulocytes: 0.04 10*3/uL (ref 0.00–0.07)
Basophils Absolute: 0.1 10*3/uL (ref 0.0–0.1)
Basophils Relative: 1 %
Eosinophils Absolute: 0.1 10*3/uL (ref 0.0–0.5)
Eosinophils Relative: 2 %
HCT: 33.5 % — ABNORMAL LOW (ref 36.0–46.0)
Hemoglobin: 10.6 g/dL — ABNORMAL LOW (ref 12.0–15.0)
Immature Granulocytes: 0 %
Lymphocytes Relative: 12 %
Lymphs Abs: 1.1 10*3/uL (ref 0.7–4.0)
MCH: 31.9 pg (ref 26.0–34.0)
MCHC: 31.6 g/dL (ref 30.0–36.0)
MCV: 100.9 fL — ABNORMAL HIGH (ref 80.0–100.0)
Monocytes Absolute: 0.7 10*3/uL (ref 0.1–1.0)
Monocytes Relative: 7 %
Neutro Abs: 7.4 10*3/uL (ref 1.7–7.7)
Neutrophils Relative %: 78 %
Platelets: 234 10*3/uL (ref 150–400)
RBC: 3.32 MIL/uL — ABNORMAL LOW (ref 3.87–5.11)
RDW: 12.8 % (ref 11.5–15.5)
WBC: 9.5 10*3/uL (ref 4.0–10.5)
nRBC: 0 % (ref 0.0–0.2)

## 2020-08-28 LAB — BASIC METABOLIC PANEL
Anion gap: 6 (ref 5–15)
BUN: 59 mg/dL — ABNORMAL HIGH (ref 8–23)
CO2: 22 mmol/L (ref 22–32)
Calcium: 8.1 mg/dL — ABNORMAL LOW (ref 8.9–10.3)
Chloride: 105 mmol/L (ref 98–111)
Creatinine, Ser: 2.1 mg/dL — ABNORMAL HIGH (ref 0.44–1.00)
GFR, Estimated: 24 mL/min — ABNORMAL LOW (ref >60)
Glucose, Bld: 242 mg/dL — ABNORMAL HIGH (ref 70–99)
Potassium: 5.9 mmol/L — ABNORMAL HIGH (ref 3.5–5.1)
Sodium: 133 mmol/L — ABNORMAL LOW (ref 135–145)

## 2020-08-28 MED ORDER — SODIUM ZIRCONIUM CYCLOSILICATE 5 G PO PACK
10.0000 g | PACK | Freq: Once | ORAL | Status: AC
  Administered 2020-08-28: 10 g via ORAL
  Filled 2020-08-28: qty 2

## 2020-08-28 MED ORDER — HYDROMORPHONE HCL 1 MG/ML IJ SOLN
0.5000 mg | Freq: Once | INTRAMUSCULAR | Status: AC
  Administered 2020-08-28: 0.5 mg via INTRAVENOUS
  Filled 2020-08-28: qty 1

## 2020-08-28 MED ORDER — OXYCODONE-ACETAMINOPHEN 5-325 MG PO TABS: 1.0000 | ORAL_TABLET | ORAL | 0 refills | Status: DC | PRN

## 2020-08-28 NOTE — Discharge Instructions (Addendum)
As discussed, stop your potassium for 2 to 3 days.  Your level today was too high.  You will need to have your potassium rechecked by your primary care doctor later this week.  Also you should be contacted by the orthopedic's office on Tuesday.  Please call his office if you do not hear from them.  Keep your right arm splinted and wear the sling as needed for support.

## 2020-08-28 NOTE — ED Provider Notes (Signed)
[facility] EMERGENCY DEPARTMENT Provider Note   CSN: [id number] Arrival date & time: 08/28/20  7741     History Chief Complaint  Patient presents with   Bethany [name] is a 75 y.o. female.   Fall Pertinent negatives include no chest pain, no abdominal pain, no headaches and no shortness of breath.     Bethany [name] is a 75 y.o. female with past medical history of atrial fibrillation, anemia, CHF, COPD, type 2 diabetes, hypertension and prior stroke.  She presents to the Emergency Department via EMS for evaluation of right shoulder pain secondary to a mechanical fall that occurred this morning.  She states that she was washing dishes when she turned and fell onto the floor.  States that she slid on the floor and struck her head on the doorway.  Recalls landing on her right side.  She has pain of her right shoulder right upper arm and wrist.  She denies any loss of consciousness, headache, dizziness or visual change.  She was given 10 mg of IV morphine by EMS in route, she continues to complain of pain of her shoulder.  No nausea or vomiting.  She denies chest pain, neck or low back pain, hip pain or knee pain.  She does take aspirin daily, but denies any other anticoagulants.  Past Medical History:  Diagnosis Date   Anemia    on oral iron   Atrial fibrillation (HCC)    CHF (congestive heart failure) (HCC)    COPD (chronic obstructive pulmonary disease) (HCC)    Diabetes mellitus without complication (HCC)    DVT (deep venous thrombosis) (HCC)    Enlarged heart    Gangrene (HCC)    GERD (gastroesophageal reflux disease)    History of kidney stones    Hypercholesteremia    Hypertension    Insomnia    Legally blind in left eye, as defined in [place]    Decreased vision in her right eye as well with limited ability to see.    Stroke [facility])    Type 2 diabetes mellitus without complication ([place]) 28/08/8674    Patient Active Problem List   Diagnosis Date Noted   Epiretinal  membrane (ERM) of right eye 01/03/2020   Moderate nonproliferative diabetic retinopathy of both eyes ([place]) 06/16/2019   Primary open angle glaucoma of both eyes, severe stage 06/16/2019   Dystrophies primarily involving the retinal pigment epithelium 06/16/2019   Retinal hemorrhage of left eye 06/16/2019   Secondary pigmentary degeneration of peripheral retina 06/16/2019   Absolute glaucoma of left eye 06/16/2019   Anemia 01/21/2019   Chronic diastolic congestive heart failure ([place]) 05/12/2018   Tubular adenoma of colon 07/12/2017   Heme positive stool 05/22/2017   Leg swelling-Right Leg > Left leg 72/10/4707   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 ([place]) 12/28/2013   Persistent atrial fibrillation ([place]) 12/28/2013   Glaucoma 12/28/2013   Blind left eye 12/28/2013   PAD (peripheral artery disease) ([place]) 10/21/2013   Anticoagulated on warfarin 03/25/2013   Aortic valve sclerosis 03/25/2013   LVH (left ventricular hypertrophy) 03/25/2013   SOB (shortness of breath) 03/25/2013   Tobacco abuse, in remission 03/25/2013   GERD (gastroesophageal reflux disease) 01/08/2013   History of kidney stones 01/08/2013   COPD (chronic obstructive pulmonary disease) ([place]) 10/07/2012   Essential hypertension 10/07/2012   After cataract not obscuring vision 10/03/2012    Past Surgical History:  Procedure Laterality Date   bilareral tubal ligation  BIOPSY  01/29/2020   Procedure: BIOPSY;  Surgeon: Daneil Dolin, MD;  Location: [facility] ENDO SUITE;  Service: Endoscopy;;   CATARACT EXTRACTION, BILATERAL     COLONOSCOPY N/A 01/29/2020   Procedure: COLONOSCOPY;  Surgeon: Daneil Dolin, MD;  Location: [facility] ENDO SUITE;  Service: Endoscopy;  Laterality: N/A;  9:30am   ESOPHAGOGASTRODUODENOSCOPY N/A 01/29/2020   Procedure: ESOPHAGOGASTRODUODENOSCOPY (EGD);  Surgeon: Daneil Dolin, MD;  Location: [facility] ENDO SUITE;  Service: Endoscopy;  Laterality: N/A;   EYE SURGERY     FRACTURE SURGERY Right 2005    Fx  leg   GLAUCOMA SURGERY Right 2014   removal of sweat glands     per pt. 30 years ago   TIBIA FRACTURE SURGERY Right 2010   TUBAL LIGATION       OB History   No obstetric history on file.     Family History  Problem Relation Age of Onset   Heart disease Mother    Hypertension Mother    Heart attack Mother    Hypertension Father    Diabetes Daughter    Colon cancer Neg Hx     Social History   Tobacco Use   Smoking status: Former    Pack years: 0.00    Types: Cigarettes    Quit date: 02/26/2001    Years since quitting: 19.5   Smokeless tobacco: Never  Vaping Use   Vaping Use: Never used  Substance Use Topics   Alcohol use: No    Alcohol/week: 0.0 standard drinks   Drug use: No    Home Medications Prior to Admission medications   Medication Sig Start Date End Date Taking? Authorizing Provider  albuterol (PROVENTIL HFA;VENTOLIN HFA) 108 (90 BASE) MCG/ACT inhaler Inhale 2 puffs into the lungs every 6 (six) hours as needed for wheezing or shortness of breath.     [provider]  amLODipine (NORVASC) 2.5 MG tablet Take 2.5 mg by mouth daily.    [provider]  aspirin EC 81 MG tablet Take 81 mg by mouth daily. Swallow whole.    [provider]  atorvastatin (LIPITOR) 10 MG tablet Take 10 mg by mouth daily.    [provider]  b complex vitamins capsule Take 1 capsule by mouth daily.    [provider]  Blood Glucose Monitoring Suppl (FORA V30A BLOOD GLUCOSE SYSTEM) DEVI by Does not apply route.    [provider]  brimonidine (ALPHAGAN) 0.2 % ophthalmic solution Place 1 drop into the right eye in the morning and at bedtime.     [provider]  candesartan (ATACAND) 32 MG tablet Take 32 mg by mouth daily. 11/19/17   [provider]  cetirizine (ZYRTEC) 10 MG tablet Take 10 mg by mouth daily.    [provider]  diltiazem (CARDIZEM CD) 300 MG 24 hr capsule Take 300 mg by mouth daily.      [provider]  dorzolamide-timolol (COSOPT) 22.3-6.8 MG/ML ophthalmic solution Place 1 drop into the right eye 2 (two) times daily. 01/01/20   [provider]  furosemide (LASIX) 40 MG tablet Take 80 mg by mouth daily.     [provider]  glipiZIDE (GLUCOTROL XL) 2.5 MG 24 hr tablet Take 2.5 mg by mouth in the morning and at bedtime.     [provider]  hydrALAZINE (APRESOLINE) 50 MG tablet Take 50 mg by mouth 3 (three) times daily.     [provider]  Iron, Ferrous Gluconate, 256 (  28 FE) MG TABS Take 325 mg by mouth daily.     [provider]  pantoprazole (PROTONIX) 40 MG tablet Take 40 mg by mouth daily.     [provider]  Potassium Chloride ER 20 MEQ TBCR Take 30 mEq by mouth daily as needed (As needed with Lasix).     [provider]    Allergies    Sulfa antibiotics, Sulfamethoxazole, and Tape  Review of Systems   Review of Systems  Constitutional:  Negative for chills, fatigue and fever.  Eyes:  Negative for visual disturbance.  Respiratory:  Negative for shortness of breath.   Cardiovascular:  Negative for chest pain and palpitations.  Gastrointestinal:  Negative for abdominal pain, nausea and vomiting.  Genitourinary:  Negative for dysuria, flank pain and hematuria.  Musculoskeletal:  Positive for arthralgias (Right shoulder, right wrist pain). Negative for back pain, myalgias, neck pain and neck stiffness.  Skin:  Negative for wound.  Neurological:  Negative for dizziness, syncope, weakness, numbness and headaches.  Hematological:  Does not bruise/bleed easily.   Physical Exam Updated Vital Signs BP (!) 143/64   Pulse (!) 57   Temp 98.7 F (37.1 C) (Oral)   Resp (!) 25   Ht 5\' 7"  (1.702 m)   Wt 102.5 kg   SpO2 96%   BMI 35.40 kg/m   Physical Exam Vitals and nursing note reviewed.  Constitutional:      General: She is not in acute distress.    Appearance: Normal appearance.  HENT:      Head:     Comments: No hematomas of the scalp, no bony deformities.    Mouth/Throat:     Mouth: Mucous membranes are moist.  Eyes:     Comments: Patient legally blind left eye  Cardiovascular:     Rate and Rhythm: Normal rate and regular rhythm.     Pulses: Normal pulses.  Pulmonary:     Effort: Pulmonary effort is normal.  Abdominal:     Palpations: Abdomen is soft.     Tenderness: There is no abdominal tenderness.  Musculoskeletal:        General: Swelling, tenderness and signs of injury present.     Comments: Tenderness palpation of the anterior right shoulder and upper to  mid upper arm, moderate edema noted.  Right elbow nontender.  There is focal tenderness to the radial aspect of the right wrist, mild to moderate edema also noted.  No bony deformity.  Compartments are soft , no open wounds  Skin:    General: Skin is warm.     Capillary Refill: Capillary refill takes less than 2 seconds.     Findings: No erythema or rash.  Neurological:     General: No focal deficit present.     Mental Status: She is alert.     Sensory: No sensory deficit.     Motor: No weakness.    ED Results / Procedures / Treatments   Labs (all labs ordered are listed, but only abnormal results are displayed) Labs Reviewed  BASIC METABOLIC PANEL - Abnormal; Notable for the following components:      Result Value   Sodium 133 (*)    Potassium 5.9 (*)    Glucose, Bld 242 (*)    BUN 59 (*)    Creatinine, Ser 2.10 (*)    Calcium 8.1 (*)    GFR, Estimated 24 (*)    All other components within normal limits  CBC WITH DIFFERENTIAL/PLATELET - Abnormal;  Notable for the following components:   RBC 3.32 (*)    Hemoglobin 10.6 (*)    HCT 33.5 (*)    MCV 100.9 (*)    All other components within normal limits    EKG EKG Interpretation  Date/Time:  Sunday August 28 2020 10:29:24 EDT Ventricular Rate:  39 PR Interval:    QRS Duration: 107 QT Interval:  425 QTC Calculation: 343 R Axis:   82 Text  Interpretation: Atrial fibrillation Borderline right axis deviation No significant change since last tracing Confirmed by Fredia Sorrow [phone]) on 08/28/2020 10:40:01 AM  Radiology DG Shoulder Right  Result Date: 08/28/2020 CLINICAL DATA:  Status post fall. EXAM: RIGHT SHOULDER - 2+ VIEW COMPARISON:  None FINDINGS: There is a comminuted fracture deformity involving the mid shaft of the right humerus. Lateral displacement of the distal fracture fragments identified by 1/2 shaft's width. No dislocation identified. Degenerative changes noted at the [facility] joint. IMPRESSION: Comminuted fracture deformity involves the mid shaft of the right humerus. Electronically Signed   By: Kerby Moors M.D.   On: 08/28/2020 11:22   DG Wrist Complete Right  Result Date: 08/28/2020 CLINICAL DATA:  75 year old with fall and right arm pain. EXAM: RIGHT WRIST - COMPLETE 3+ VIEW COMPARISON:  None. FINDINGS: Dorsally displaced fracture of the distal radius. Distal radial fracture appears to be mildly comminuted. Mildly displaced fracture of the distal ulna. There is a small defect along the radial and distal aspect of the scaphoid of uncertain chronicity. Questionable loose body or bone fragment adjacent to the trapezium. Margins of the scaphoid are relatively sclerotic and suspect this is more chronic. IMPRESSION: Displaced fractures of the distal right radius and ulna. Irregularity involving the scaphoid but this could be chronic. Electronically Signed   By: Markus Daft M.D.   On: 08/28/2020 11:28   CT Head Wo Contrast  Result Date: 08/28/2020 CLINICAL DATA:  Fall today, patient c/o hitting right side head.denies pain at this time and denies LOCHead trauma, minor (Age >= 65y) EXAM: CT HEAD WITHOUT CONTRAST TECHNIQUE: Contiguous axial images were obtained from the base of the skull through the vertex without intravenous contrast. COMPARISON:  None. FINDINGS: Brain: No acute intracranial hemorrhage. No focal mass lesion. No CT  evidence of acute infarction. No midline shift or mass effect. No hydrocephalus. Basilar cisterns are patent. Vascular: No hyperdense vessel or unexpected calcification. Skull: Normal. Negative for fracture or focal lesion. Sinuses/Orbits: Paranasal sinuses and mastoid air cells are clear. Orbits are clear. Other: None IMPRESSION: No intracranial trauma Electronically Signed   By: Suzy Bouchard M.D.   On: 08/28/2020 11:31   DG Humerus Right  Result Date: 08/28/2020 CLINICAL DATA:  Fall, right arm pain EXAM: RIGHT HUMERUS - 2+ VIEW COMPARISON:  None. FINDINGS: Displaced transverse fracture through the mid shaft of the humeral diaphysis. There is approximately 2 cm of displacement. There is associated soft tissue swelling. IMPRESSION: Acute displaced transverse fracture through the mid shaft of the humeral diaphysis. Electronically Signed   By: Jacqulynn Cadet M.D.   On: 08/28/2020 11:23    Procedures Procedures   Medications Ordered in ED Medications  HYDROmorphone (DILAUDID) injection 0.5 mg (has no administration in time range)    ED Course  I have reviewed the triage vital signs and the nursing notes.  Pertinent labs & imaging results that were available during my care of the patient were reviewed by me and considered in my medical decision making (see chart for details).    MDM  Rules/Calculators/A&P                          Patient here for evaluation of right shoulder and right wrist injury secondary to mechanical fall that occurred this morning.  Brought in by EMS.  She was given morphine in route with some relief of her pain.  High clinical suspicion for fracture.  Will obtain baseline labs and imaging.  Reports head injury without LOC, takes aspirin but no other anticoagulants.  Labs reviewed by me, no leukocytosis.  Mild anemia that appears baseline.  Patient has history of kidney disease per patient's daughter. today's kidney functions are similar to her baseline.  She does have  new hyperkalemia of 5.9.  She has been placed on cardiac monitoring, no acute EKG changes noted.  Patient given oral dose of Lokelma.  She does take a diuretic and supplements her diuretic with potassium.  I have recommended that she discontinue her potassium and arrange for close outpatient follow with PCP later this week for recheck of her potassium level.  Patient and daughter verbalized understanding.  1310 discussed findings with orthopedics, Dr. Amedeo Kinsman who recommends coaptation splint to the upper arm and sugar-tong splint of the wrist.  He will provide close follow-up in office.  Pt agreeable to plan.  Sling provided as well as short course of  pain medication  Final Clinical Impression(s) / ED Diagnoses Final diagnoses:  Closed nondisplaced transverse fracture of shaft of right humerus, initial encounter  Closed fracture of distal ends of right radius and ulna, initial encounter  Hyperkalemia    Rx / DC Orders ED Discharge Orders     None        Kem Parkinson, PA-C 08/30/20 1344    Fredia Sorrow, MD 09/02/20 [phone]

## 2020-08-28 NOTE — ED Triage Notes (Signed)
Pt brought in by [facility] from home with c/o fall this morning with right shoulder injury. 20g IV left arm. Morphine 10mg  IV given by EMS. Denies LOC. SBP 116 for EMS.

## 2020-08-30 DIAGNOSIS — Z299 Encounter for prophylactic measures, unspecified: Secondary | ICD-10-CM | POA: Diagnosis not present

## 2020-08-30 DIAGNOSIS — E1165 Type 2 diabetes mellitus with hyperglycemia: Secondary | ICD-10-CM | POA: Diagnosis not present

## 2020-08-30 DIAGNOSIS — Y92009 Unspecified place in unspecified non-institutional (private) residence as the place of occurrence of the external cause: Secondary | ICD-10-CM | POA: Diagnosis not present

## 2020-08-30 DIAGNOSIS — S4291XA Fracture of right shoulder girdle, part unspecified, initial encounter for closed fracture: Secondary | ICD-10-CM | POA: Diagnosis not present

## 2020-08-30 DIAGNOSIS — W19XXXA Unspecified fall, initial encounter: Secondary | ICD-10-CM | POA: Diagnosis not present

## 2020-08-31 ENCOUNTER — Ambulatory Visit: Payer: Medicare Other

## 2020-08-31 ENCOUNTER — Encounter (HOSPITAL_COMMUNITY): Payer: Self-pay | Admitting: Internal Medicine

## 2020-08-31 ENCOUNTER — Inpatient Hospital Stay (HOSPITAL_COMMUNITY)
Admission: AD | Admit: 2020-08-31 | Discharge: 2020-09-05 | DRG: 563 | Disposition: A | Discharge status: Skilled Nursing Facility | Payer: Medicare Other | Attending: Internal Medicine | Admitting: Internal Medicine

## 2020-08-31 ENCOUNTER — Ambulatory Visit (INDEPENDENT_AMBULATORY_CARE_PROVIDER_SITE_OTHER): Payer: Medicare Other | Admitting: Orthopedic Surgery

## 2020-08-31 ENCOUNTER — Other Ambulatory Visit: Payer: Self-pay

## 2020-08-31 ENCOUNTER — Encounter: Payer: Self-pay | Admitting: Orthopedic Surgery

## 2020-08-31 VITALS — BP 142/61 | HR 63 | Ht 67.0 in | Wt 226.0 lb

## 2020-08-31 DIAGNOSIS — Z20822 Contact with and (suspected) exposure to covid-19: Secondary | ICD-10-CM | POA: Diagnosis not present

## 2020-08-31 DIAGNOSIS — S42324A Nondisplaced transverse fracture of shaft of humerus, right arm, initial encounter for closed fracture: Principal | ICD-10-CM | POA: Diagnosis present

## 2020-08-31 DIAGNOSIS — I5032 Chronic diastolic (congestive) heart failure: Secondary | ICD-10-CM

## 2020-08-31 DIAGNOSIS — S42301A Unspecified fracture of shaft of humerus, right arm, initial encounter for closed fracture: Secondary | ICD-10-CM | POA: Diagnosis not present

## 2020-08-31 DIAGNOSIS — R279 Unspecified lack of coordination: Secondary | ICD-10-CM | POA: Diagnosis not present

## 2020-08-31 DIAGNOSIS — H548 Legal blindness, as defined in USA: Secondary | ICD-10-CM | POA: Diagnosis not present

## 2020-08-31 DIAGNOSIS — S52601A Unspecified fracture of lower end of right ulna, initial encounter for closed fracture: Secondary | ICD-10-CM

## 2020-08-31 DIAGNOSIS — S42321D Displaced transverse fracture of shaft of humerus, right arm, subsequent encounter for fracture with routine healing: Secondary | ICD-10-CM | POA: Diagnosis not present

## 2020-08-31 DIAGNOSIS — N184 Chronic kidney disease, stage 4 (severe): Secondary | ICD-10-CM | POA: Diagnosis not present

## 2020-08-31 DIAGNOSIS — Y93G1 Activity, food preparation and clean up: Secondary | ICD-10-CM | POA: Diagnosis not present

## 2020-08-31 DIAGNOSIS — S42351D Displaced comminuted fracture of shaft of humerus, right arm, subsequent encounter for fracture with routine healing: Secondary | ICD-10-CM | POA: Diagnosis not present

## 2020-08-31 DIAGNOSIS — S52501D Unspecified fracture of the lower end of right radius, subsequent encounter for closed fracture with routine healing: Secondary | ICD-10-CM | POA: Diagnosis not present

## 2020-08-31 DIAGNOSIS — E668 Other obesity: Secondary | ICD-10-CM | POA: Diagnosis present

## 2020-08-31 DIAGNOSIS — I10 Essential (primary) hypertension: Secondary | ICD-10-CM | POA: Diagnosis not present

## 2020-08-31 DIAGNOSIS — Z86718 Personal history of other venous thrombosis and embolism: Secondary | ICD-10-CM

## 2020-08-31 DIAGNOSIS — S42211A Unspecified displaced fracture of surgical neck of right humerus, initial encounter for closed fracture: Secondary | ICD-10-CM

## 2020-08-31 DIAGNOSIS — I4819 Other persistent atrial fibrillation: Secondary | ICD-10-CM | POA: Diagnosis not present

## 2020-08-31 DIAGNOSIS — S42321A Displaced transverse fracture of shaft of humerus, right arm, initial encounter for closed fracture: Secondary | ICD-10-CM | POA: Diagnosis not present

## 2020-08-31 DIAGNOSIS — K567 Ileus, unspecified: Secondary | ICD-10-CM | POA: Diagnosis not present

## 2020-08-31 DIAGNOSIS — S52501A Unspecified fracture of the lower end of right radius, initial encounter for closed fracture: Secondary | ICD-10-CM | POA: Diagnosis present

## 2020-08-31 DIAGNOSIS — E1122 Type 2 diabetes mellitus with diabetic chronic kidney disease: Secondary | ICD-10-CM | POA: Diagnosis present

## 2020-08-31 DIAGNOSIS — J449 Chronic obstructive pulmonary disease, unspecified: Secondary | ICD-10-CM | POA: Diagnosis not present

## 2020-08-31 DIAGNOSIS — D631 Anemia in chronic kidney disease: Secondary | ICD-10-CM | POA: Diagnosis present

## 2020-08-31 DIAGNOSIS — S52601D Unspecified fracture of lower end of right ulna, subsequent encounter for closed fracture with routine healing: Secondary | ICD-10-CM | POA: Diagnosis not present

## 2020-08-31 DIAGNOSIS — H401130 Primary open-angle glaucoma, bilateral, stage unspecified: Secondary | ICD-10-CM | POA: Diagnosis not present

## 2020-08-31 DIAGNOSIS — Y92030 Kitchen in apartment as the place of occurrence of the external cause: Secondary | ICD-10-CM | POA: Diagnosis not present

## 2020-08-31 DIAGNOSIS — K219 Gastro-esophageal reflux disease without esophagitis: Secondary | ICD-10-CM | POA: Diagnosis present

## 2020-08-31 DIAGNOSIS — M79601 Pain in right arm: Secondary | ICD-10-CM

## 2020-08-31 DIAGNOSIS — D509 Iron deficiency anemia, unspecified: Secondary | ICD-10-CM | POA: Diagnosis not present

## 2020-08-31 DIAGNOSIS — Z79899 Other long term (current) drug therapy: Secondary | ICD-10-CM

## 2020-08-31 DIAGNOSIS — Z7984 Long term (current) use of oral hypoglycemic drugs: Secondary | ICD-10-CM

## 2020-08-31 DIAGNOSIS — I482 Chronic atrial fibrillation, unspecified: Secondary | ICD-10-CM | POA: Diagnosis not present

## 2020-08-31 DIAGNOSIS — E7800 Pure hypercholesterolemia, unspecified: Secondary | ICD-10-CM | POA: Diagnosis present

## 2020-08-31 DIAGNOSIS — Z7401 Bed confinement status: Secondary | ICD-10-CM | POA: Diagnosis not present

## 2020-08-31 DIAGNOSIS — I130 Hypertensive heart and chronic kidney disease with heart failure and stage 1 through stage 4 chronic kidney disease, or unspecified chronic kidney disease: Secondary | ICD-10-CM | POA: Diagnosis not present

## 2020-08-31 DIAGNOSIS — Z8249 Family history of ischemic heart disease and other diseases of the circulatory system: Secondary | ICD-10-CM | POA: Diagnosis not present

## 2020-08-31 DIAGNOSIS — I132 Hypertensive heart and chronic kidney disease with heart failure and with stage 5 chronic kidney disease, or end stage renal disease: Secondary | ICD-10-CM | POA: Diagnosis not present

## 2020-08-31 DIAGNOSIS — W1830XA Fall on same level, unspecified, initial encounter: Secondary | ICD-10-CM | POA: Diagnosis present

## 2020-08-31 DIAGNOSIS — Z9181 History of falling: Secondary | ICD-10-CM | POA: Diagnosis not present

## 2020-08-31 DIAGNOSIS — E113393 Type 2 diabetes mellitus with moderate nonproliferative diabetic retinopathy without macular edema, bilateral: Secondary | ICD-10-CM | POA: Diagnosis not present

## 2020-08-31 DIAGNOSIS — Z87442 Personal history of urinary calculi: Secondary | ICD-10-CM

## 2020-08-31 DIAGNOSIS — Z6835 Body mass index (BMI) 35.0-35.9, adult: Secondary | ICD-10-CM

## 2020-08-31 DIAGNOSIS — Z7982 Long term (current) use of aspirin: Secondary | ICD-10-CM | POA: Diagnosis not present

## 2020-08-31 DIAGNOSIS — I509 Heart failure, unspecified: Secondary | ICD-10-CM | POA: Diagnosis not present

## 2020-08-31 DIAGNOSIS — K3189 Other diseases of stomach and duodenum: Secondary | ICD-10-CM | POA: Diagnosis not present

## 2020-08-31 DIAGNOSIS — N186 End stage renal disease: Secondary | ICD-10-CM | POA: Diagnosis not present

## 2020-08-31 DIAGNOSIS — T148XXA Other injury of unspecified body region, initial encounter: Secondary | ICD-10-CM

## 2020-08-31 DIAGNOSIS — M6281 Muscle weakness (generalized): Secondary | ICD-10-CM | POA: Diagnosis not present

## 2020-08-31 DIAGNOSIS — M25531 Pain in right wrist: Secondary | ICD-10-CM

## 2020-08-31 DIAGNOSIS — R001 Bradycardia, unspecified: Secondary | ICD-10-CM | POA: Diagnosis not present

## 2020-08-31 DIAGNOSIS — E875 Hyperkalemia: Secondary | ICD-10-CM | POA: Diagnosis not present

## 2020-08-31 DIAGNOSIS — S63074D Dislocation of distal end of right ulna, subsequent encounter: Secondary | ICD-10-CM | POA: Diagnosis not present

## 2020-08-31 DIAGNOSIS — S52531A Colles' fracture of right radius, initial encounter for closed fracture: Secondary | ICD-10-CM

## 2020-08-31 DIAGNOSIS — S42309A Unspecified fracture of shaft of humerus, unspecified arm, initial encounter for closed fracture: Secondary | ICD-10-CM | POA: Diagnosis present

## 2020-08-31 DIAGNOSIS — Z833 Family history of diabetes mellitus: Secondary | ICD-10-CM

## 2020-08-31 DIAGNOSIS — Z87891 Personal history of nicotine dependence: Secondary | ICD-10-CM

## 2020-08-31 DIAGNOSIS — Z743 Need for continuous supervision: Secondary | ICD-10-CM | POA: Diagnosis not present

## 2020-08-31 DIAGNOSIS — R2689 Other abnormalities of gait and mobility: Secondary | ICD-10-CM | POA: Diagnosis not present

## 2020-08-31 DIAGNOSIS — Z882 Allergy status to sulfonamides status: Secondary | ICD-10-CM

## 2020-08-31 DIAGNOSIS — Z8673 Personal history of transient ischemic attack (TIA), and cerebral infarction without residual deficits: Secondary | ICD-10-CM

## 2020-08-31 LAB — CBC
HCT: 28.2 % — ABNORMAL LOW (ref 36.0–46.0)
Hemoglobin: 9 g/dL — ABNORMAL LOW (ref 12.0–15.0)
MCH: 32.7 pg (ref 26.0–34.0)
MCHC: 31.9 g/dL (ref 30.0–36.0)
MCV: 102.5 fL — ABNORMAL HIGH (ref 80.0–100.0)
Platelets: 175 10*3/uL (ref 150–400)
RBC: 2.75 MIL/uL — ABNORMAL LOW (ref 3.87–5.11)
RDW: 13.2 % (ref 11.5–15.5)
WBC: 7.7 10*3/uL (ref 4.0–10.5)
nRBC: 0 % (ref 0.0–0.2)

## 2020-08-31 LAB — HEMOGLOBIN A1C
Hgb A1c MFr Bld: 6.6 % — ABNORMAL HIGH (ref 4.8–5.6)
Mean Plasma Glucose: 142.72 mg/dL

## 2020-08-31 LAB — BASIC METABOLIC PANEL
Anion gap: 8 (ref 5–15)
BUN: 64 mg/dL — ABNORMAL HIGH (ref 8–23)
CO2: 22 mmol/L (ref 22–32)
Calcium: 8.6 mg/dL — ABNORMAL LOW (ref 8.9–10.3)
Chloride: 107 mmol/L (ref 98–111)
Creatinine, Ser: 2.29 mg/dL — ABNORMAL HIGH (ref 0.44–1.00)
GFR, Estimated: 22 mL/min — ABNORMAL LOW (ref >60)
Glucose, Bld: 140 mg/dL — ABNORMAL HIGH (ref 70–99)
Potassium: 4.1 mmol/L (ref 3.5–5.1)
Sodium: 137 mmol/L (ref 135–145)

## 2020-08-31 LAB — GLUCOSE, CAPILLARY
Glucose-Capillary: 186 mg/dL — ABNORMAL HIGH (ref 70–99)
Glucose-Capillary: 215 mg/dL — ABNORMAL HIGH (ref 70–99)

## 2020-08-31 LAB — RESP PANEL BY RT-PCR (FLU A&B, COVID) ARPGX2
Influenza A by PCR: NEGATIVE
Influenza B by PCR: NEGATIVE
SARS Coronavirus 2 by RT PCR: NEGATIVE

## 2020-08-31 MED ORDER — ATORVASTATIN CALCIUM 10 MG PO TABS
10.0000 mg | ORAL_TABLET | Freq: Every day | ORAL | Status: DC
  Administered 2020-08-31 – 2020-09-04 (×5): 10 mg via ORAL
  Filled 2020-08-31 (×5): qty 1

## 2020-08-31 MED ORDER — LATANOPROST 0.005 % OP SOLN
1.0000 [drp] | Freq: Every day | OPHTHALMIC | Status: DC
  Administered 2020-09-01 – 2020-09-04 (×5): 1 [drp] via OPHTHALMIC
  Filled 2020-08-31: qty 2.5

## 2020-08-31 MED ORDER — ASPIRIN EC 81 MG PO TBEC
81.0000 mg | DELAYED_RELEASE_TABLET | Freq: Every day | ORAL | Status: DC
  Administered 2020-09-02 – 2020-09-05 (×4): 81 mg via ORAL
  Filled 2020-08-31 (×6): qty 1

## 2020-08-31 MED ORDER — LACTATED RINGERS IV SOLN: INTRAVENOUS | Status: AC

## 2020-08-31 MED ORDER — PANTOPRAZOLE SODIUM 40 MG PO TBEC
40.0000 mg | DELAYED_RELEASE_TABLET | Freq: Every day | ORAL | Status: DC
  Administered 2020-09-02 – 2020-09-05 (×4): 40 mg via ORAL
  Filled 2020-08-31 (×5): qty 1

## 2020-08-31 MED ORDER — ACETAMINOPHEN 650 MG RE SUPP: 650.0000 mg | Freq: Four times a day (QID) | RECTAL | Status: DC | PRN

## 2020-08-31 MED ORDER — HYDRALAZINE HCL 25 MG PO TABS
50.0000 mg | ORAL_TABLET | Freq: Three times a day (TID) | ORAL | Status: DC
  Administered 2020-08-31 – 2020-09-05 (×15): 50 mg via ORAL
  Filled 2020-08-31 (×17): qty 2

## 2020-08-31 MED ORDER — FERROUS GLUCONATE 324 (38 FE) MG PO TABS
325.0000 mg | ORAL_TABLET | Freq: Every day | ORAL | Status: DC
  Administered 2020-09-02 – 2020-09-03 (×2): 325 mg via ORAL
  Filled 2020-08-31 (×5): qty 2

## 2020-08-31 MED ORDER — B COMPLEX-C PO TABS
1.0000 | ORAL_TABLET | Freq: Every day | ORAL | Status: DC
  Administered 2020-09-02 – 2020-09-05 (×4): 1 via ORAL
  Filled 2020-08-31 (×6): qty 1

## 2020-08-31 MED ORDER — ACETAMINOPHEN 325 MG PO TABS
650.0000 mg | ORAL_TABLET | Freq: Four times a day (QID) | ORAL | Status: DC | PRN
  Administered 2020-09-02 – 2020-09-05 (×2): 650 mg via ORAL
  Filled 2020-08-31 (×2): qty 2

## 2020-08-31 MED ORDER — INSULIN ASPART 100 UNIT/ML IJ SOLN
0.0000 [IU] | INTRAMUSCULAR | Status: DC
  Administered 2020-08-31: 5 [IU] via SUBCUTANEOUS
  Administered 2020-09-01 – 2020-09-02 (×3): 3 [IU] via SUBCUTANEOUS
  Administered 2020-09-02: 2 [IU] via SUBCUTANEOUS
  Administered 2020-09-02 (×3): 3 [IU] via SUBCUTANEOUS
  Administered 2020-09-03: 2 [IU] via SUBCUTANEOUS
  Administered 2020-09-03 (×2): 3 [IU] via SUBCUTANEOUS
  Administered 2020-09-03: 2 [IU] via SUBCUTANEOUS
  Administered 2020-09-03 – 2020-09-04 (×3): 3 [IU] via SUBCUTANEOUS
  Administered 2020-09-04 – 2020-09-05 (×5): 2 [IU] via SUBCUTANEOUS
  Administered 2020-09-05: 3 [IU] via SUBCUTANEOUS

## 2020-08-31 MED ORDER — HYDRALAZINE HCL 20 MG/ML IJ SOLN
10.0000 mg | INTRAMUSCULAR | Status: DC | PRN
  Administered 2020-09-02: 10 mg via INTRAVENOUS
  Filled 2020-08-31: qty 1

## 2020-08-31 MED ORDER — ONDANSETRON HCL 4 MG PO TABS: 4.0000 mg | ORAL_TABLET | Freq: Four times a day (QID) | ORAL | Status: DC | PRN

## 2020-08-31 MED ORDER — CLONIDINE HCL 0.1 MG PO TABS
0.1000 mg | ORAL_TABLET | Freq: Three times a day (TID) | ORAL | Status: DC
  Administered 2020-08-31 – 2020-09-05 (×14): 0.1 mg via ORAL
  Filled 2020-08-31 (×15): qty 1

## 2020-08-31 MED ORDER — LORATADINE 10 MG PO TABS
10.0000 mg | ORAL_TABLET | Freq: Every day | ORAL | Status: DC
  Administered 2020-09-02 – 2020-09-05 (×4): 10 mg via ORAL
  Filled 2020-08-31 (×6): qty 1

## 2020-08-31 MED ORDER — ONDANSETRON HCL 4 MG/2ML IJ SOLN
4.0000 mg | Freq: Four times a day (QID) | INTRAMUSCULAR | Status: DC | PRN
  Administered 2020-09-02 – 2020-09-05 (×4): 4 mg via INTRAVENOUS
  Filled 2020-08-31 (×4): qty 2

## 2020-08-31 MED ORDER — ALBUTEROL SULFATE (2.5 MG/3ML) 0.083% IN NEBU
3.0000 mL | INHALATION_SOLUTION | Freq: Four times a day (QID) | RESPIRATORY_TRACT | Status: DC | PRN
  Administered 2020-09-01: 3 mL via RESPIRATORY_TRACT
  Filled 2020-08-31: qty 3

## 2020-08-31 MED ORDER — HYDROMORPHONE HCL 1 MG/ML IJ SOLN
0.5000 mg | INTRAMUSCULAR | Status: DC | PRN
  Administered 2020-08-31 (×2): 0.5 mg via INTRAVENOUS
  Administered 2020-09-01 – 2020-09-02 (×5): 1 mg via INTRAVENOUS
  Filled 2020-08-31: qty 1
  Filled 2020-08-31 (×2): qty 0.5
  Filled 2020-08-31 (×4): qty 1

## 2020-08-31 MED ORDER — BRIMONIDINE TARTRATE 0.2 % OP SOLN
1.0000 [drp] | Freq: Two times a day (BID) | OPHTHALMIC | Status: DC
  Administered 2020-08-31 – 2020-09-05 (×11): 1 [drp] via OPHTHALMIC
  Filled 2020-08-31: qty 5

## 2020-08-31 MED ORDER — AMLODIPINE BESYLATE 5 MG PO TABS
2.5000 mg | ORAL_TABLET | Freq: Every day | ORAL | Status: DC
  Administered 2020-09-02 – 2020-09-05 (×4): 2.5 mg via ORAL
  Filled 2020-08-31 (×6): qty 1

## 2020-08-31 MED ORDER — DORZOLAMIDE HCL-TIMOLOL MAL 2-0.5 % OP SOLN
1.0000 [drp] | Freq: Two times a day (BID) | OPHTHALMIC | Status: DC
  Administered 2020-08-31 – 2020-09-05 (×11): 1 [drp] via OPHTHALMIC
  Filled 2020-08-31: qty 10

## 2020-08-31 NOTE — Progress Notes (Signed)
New Patient Visit  Assessment: Bethany [name] is a 75 y.o. female with the following: 1.  Displaced right distal radius fracture with dorsal and radial angulation 2.  Displaced right distal ulna fracture with radial angulation 3.  Displaced right humeral shaft fracture   Plan: Repeat radiographs were obtained in clinic today, and her current splint and demonstrates insufficient reduction of the humeral shaft fracture, as well as the distal radius and ulnar fractures.  The patient has multiple medical comorbidities, and is therefore not a good surgical candidate.  As a result, we will plan to treat these injuries without surgery, but based on the current x-rays, I do think she benefit from a repeat reduction.  A repeat reduction would not be well-tolerated in the clinic today, and I think it is best to perform these reductions in the operating room under general anesthesia or IV sedation.  This was discussed with the patient and family members in clinic today.  I also discussed this briefly with anesthesia, who recommended admission to the medicine service for further evaluation and perioperative medical optimization.  She will be admitted to the hospital, with plans for closed reduction of the humeral shaft and distal radius fractures in the operating room tomorrow, provided that she is medically stable.  We will plan to open reduce any of these injuries.  Therefore, risks of infection and bleeding are negligible.  There are risks associated with sedation and anesthesia, which is why she is being admitted prior to the procedure.  Family members also expressed some interest in having her discharged to a nursing or rehab facility as she requires considerable assistance at home.  We will see her in clinic approximately 2 weeks after the procedure, with plan to transition to a Sarmiento fracture brace, and a short arm cast.  Surgical Plan  Procedure: Closed reduction and splinting of a right distal radius  and ulna fracture, as well as a humeral shaft fracture Disposition: Inpatient Anesthesia: [facility] Medical Comorbidities: COPD, A. fib, CHF, diabetes Notes: Strictly closed procedure  Follow-up: Return for After surgery.  Subjective:  Chief Complaint  Patient presents with   New Patient (Initial Visit)    fall    History of Present Illness: Bethany [name] is a 75 y.o. RHD female who presents to clinic for evaluation of right arm pain.  3 days ago, in her own house, she was washing dishes when she turned around and fell.  She landed on the floor.  She noted immediate pain in her right arm, including the shoulder area, as well as her wrist.  She is able to be transported to the emergency department for evaluation.  X-rays at that time demonstrated humeral shaft fracture, as well as a displaced distal radius and ulna fracture.  She was placed in a coaptation splint, as well as a sugar-tong.  Her pain has been tolerable.  She has been moving her fingers without numbness or tingling.  She has been using a sling due to the weight of the splint on her arm.   Review of Systems: No fevers or chills No numbness or tingling No chest pain No shortness of breath No bowel or bladder dysfunction No GI distress No headaches   Medical History:  Past Medical History:  Diagnosis Date   Anemia    on oral iron   Atrial fibrillation (HCC)    CHF (congestive heart failure) (HCC)    COPD (chronic obstructive pulmonary disease) (HCC)    Diabetes mellitus without  complication ([place])    DVT (deep venous thrombosis) (HCC)    Enlarged heart    Gangrene (HCC)    GERD (gastroesophageal reflux disease)    History of kidney stones    Hypercholesteremia    Hypertension    Insomnia    Legally blind in left eye, as defined in [place]    Decreased vision in her right eye as well with limited ability to see.    Stroke [facility])    Type 2 diabetes mellitus without complication ([place]) 04/30/7423    Past Surgical  History:  Procedure Laterality Date   bilareral tubal ligation     BIOPSY  01/29/2020   Procedure: BIOPSY;  Surgeon: Daneil Dolin, MD;  Location[facility];  Service: Endoscopy;;   CATARACT EXTRACTION, BILATERAL     COLONOSCOPY N/A 01/29/2020   Procedure: COLONOSCOPY;  Surgeon: Daneil Dolin, MD;  Location: [facility];  Service: Endoscopy;  Laterality: N/A;  9:30am   ESOPHAGOGASTRODUODENOSCOPY N/A 01/29/2020   Procedure: ESOPHAGOGASTRODUODENOSCOPY (EGD);  Surgeon: Daneil Dolin, MD;  Location[facility];  Service: Endoscopy;  Laterality: N/A;   EYE SURGERY     FRACTURE SURGERY Right 2005   Fx  leg   GLAUCOMA SURGERY Right 2014   removal of sweat glands     per pt. 30 years ago   TIBIA FRACTURE SURGERY Right 2010   TUBAL LIGATION      Family History  Problem Relation Age of Onset   Heart disease Mother    Hypertension Mother    Heart attack Mother    Hypertension Father    Diabetes Daughter    Colon cancer Neg Hx    Social History   Tobacco Use   Smoking status: Former    Pack years: 0.00    Types: Cigarettes    Quit date: 02/26/2001    Years since quitting: 19.5   Smokeless tobacco: Never  Vaping Use   Vaping Use: Never used  Substance Use Topics   Alcohol use: No    Alcohol/week: 0.0 standard drinks   Drug use: No    Allergies  Allergen Reactions   Sulfa Antibiotics Hives and Swelling   Sulfamethoxazole Hives   Tape Other (See Comments)    Pulls skin off. Skin irritation and breakdown    Current Meds  Medication Sig   albuterol (PROVENTIL HFA;VENTOLIN HFA) 108 (90 BASE) MCG/ACT inhaler Inhale 2 puffs into the lungs every 6 (six) hours as needed for wheezing or shortness of breath.    amLODipine (NORVASC) 2.5 MG tablet Take 2.5 mg by mouth daily.   aspirin EC 81 MG tablet Take 81 mg by mouth daily. Swallow whole.   atorvastatin (LIPITOR) 10 MG tablet Take 10 mg by mouth daily.   b complex vitamins capsule Take 1 capsule by mouth daily.    Blood Glucose Monitoring Suppl (FORA V30A BLOOD GLUCOSE SYSTEM) DEVI by Does not apply route.   brimonidine (ALPHAGAN) 0.2 % ophthalmic solution Place 1 drop into the right eye in the morning and at bedtime.    candesartan (ATACAND) 32 MG tablet Take 32 mg by mouth daily.   cetirizine (ZYRTEC) 10 MG tablet Take 10 mg by mouth daily.   cloNIDine (CATAPRES) 0.1 MG tablet Take 0.1 mg by mouth 3 (three) times daily.   diltiazem (CARDIZEM CD) 300 MG 24 hr capsule Take 300 mg by mouth daily.    dorzolamide-timolol (COSOPT) 22.3-6.8 MG/ML ophthalmic solution Place 1 drop into the right eye  2 (two) times daily.   furosemide (LASIX) 40 MG tablet Take 80 mg by mouth daily.    glipiZIDE (GLUCOTROL XL) 2.5 MG 24 hr tablet Take 2.5 mg by mouth in the morning and at bedtime.    hydrALAZINE (APRESOLINE) 50 MG tablet Take 50 mg by mouth 3 (three) times daily.    Iron, Ferrous Gluconate, 256 (28 FE) MG TABS Take 325 mg by mouth daily.    latanoprost (XALATAN) 0.005 % ophthalmic solution Place 1 drop into the right eye at bedtime.   oxyCODONE-acetaminophen (PERCOCET/ROXICET) 5-325 MG tablet Take 1 tablet by mouth every 4 (four) hours as needed.   pantoprazole (PROTONIX) 40 MG tablet Take 40 mg by mouth daily.    Potassium Chloride ER 20 MEQ TBCR Take 30 mEq by mouth daily as needed (As needed with Lasix).     Objective: BP (!) 142/61   Pulse 63   Ht 5\' 7"  (1.702 m)   Wt 226 lb (102.5 kg)   BMI 35.40 kg/m   Physical Exam:  General: Elderly female., Alert and oriented., and No acute distress.  Patient is legally blind Gait: Ambulates with assistance  Right upper extremity is in a splint from her shoulder to her fingers.  Fingers are warm and well-perfused.  Sensation is intact to the thumb, index and small fingers.  She is able to flex her fingers, as well as fully extend her fingers.  No obvious skin breakdown at the periphery of the splint.  IMAGING: I personally ordered and reviewed the following  images  X-rays of the humerus, and a splint were obtained in clinic today.  On the AP view, the midshaft humerus fracture is in good alignment.  However, on the lateral view, there is clear varus angulation.  Minimal comminution.  Fracture is primarily transverse.  Impression: Right humerus fracture in varus alignment  X-rays of the distal radius were obtained in clinic today and demonstrates a displaced distal radius and ulna fracture.  Distal radius is partially reduced, with persistent dorsal translation and angulation.  On the AP view, the distal radius and distal ulna are both translated radially.  There is loss of height of the distal radius.  Impression: Partially reduced right distal radius fracture with persistent dorsal angulation and translation.  Distal radius and ulnar fractures deviated radially.   New Medications:  No orders of the defined types were placed in this encounter.     Mordecai Rasmussen, MD  08/31/2020 12:53 PM

## 2020-08-31 NOTE — H&P (Signed)
History and Physical    Bethany [name] KNL:[id number] DOB: 07-14-1945 DOA: 08/31/2020  PCP: Glenda Chroman, MD   Patient coming from: Orthopedic office  Chief Complaint: R upper extremity fractures with need for operative intervention  HPI: Bethany [name] is a 75 y.o. female with medical history significant for atrial fibrillation, anemia, COPD, type 2 diabetes, hypertension, prior CVA, and GERD who presented to the ED on 7/3 for evaluation of right shoulder pain secondary to mechanical fall.  She was noted to have a displaced right distal radius fracture as well as displaced right distal ulna fracture and right humeral shaft fracture which was to be further evaluated by orthopedics in the outpatient setting.  She was seen today on 7/6 and she was noted to have insufficient reduction of the humeral shaft fracture as well as the distal radius and ulna fractures.  Plan is to perform closed reduction under general anesthesia and/or IV sedation, but given her multiple medical comorbidities she will be admitted to the hospital for further lab work and evaluation prior to the procedure.  Of note, she is legally blind.   ED Course: None  Review of Systems: Reviewed as noted above, otherwise negative.  Past Medical History:  Diagnosis Date   Anemia    on oral iron   Atrial fibrillation (HCC)    CHF (congestive heart failure) (HCC)    COPD (chronic obstructive pulmonary disease) (HCC)    Diabetes mellitus without complication (HCC)    DVT (deep venous thrombosis) (HCC)    Enlarged heart    Gangrene (HCC)    GERD (gastroesophageal reflux disease)    History of kidney stones    Hypercholesteremia    Hypertension    Insomnia    Legally blind in left eye, as defined in [place]    Decreased vision in her right eye as well with limited ability to see.    Stroke [facility])    Type 2 diabetes mellitus without complication ([place]) 79/0/2409    Past Surgical History:  Procedure Laterality Date   bilareral tubal  ligation     BIOPSY  01/29/2020   Procedure: BIOPSY;  Surgeon: Daneil Dolin, MD;  Location: [facility];  Service: Endoscopy;;   CATARACT EXTRACTION, BILATERAL     COLONOSCOPY N/A 01/29/2020   Procedure: COLONOSCOPY;  Surgeon: Daneil Dolin, MD;  Location: [facility];  Service: Endoscopy;  Laterality: N/A;  9:30am   ESOPHAGOGASTRODUODENOSCOPY N/A 01/29/2020   Procedure: ESOPHAGOGASTRODUODENOSCOPY (EGD);  Surgeon: Daneil Dolin, MD;  Location: [facility];  Service: Endoscopy;  Laterality: N/A;   EYE SURGERY     FRACTURE SURGERY Right 2005   Fx  leg   GLAUCOMA SURGERY Right 2014   removal of sweat glands     per pt. 30 years ago   TIBIA FRACTURE SURGERY Right 2010   TUBAL LIGATION       reports that she quit smoking about 19 years ago. Her smoking use included cigarettes. She has never used smokeless tobacco. She reports that she does not drink alcohol and does not use drugs.  Allergies  Allergen Reactions   Sulfa Antibiotics Hives and Swelling   Sulfamethoxazole Hives   Tape Other (See Comments)    Pulls skin off. Skin irritation and breakdown    Family History  Problem Relation Age of Onset   Heart disease Mother    Hypertension Mother    Heart attack Mother    Hypertension Father    Diabetes  Daughter    Colon cancer Neg Hx     Prior to Admission medications   Medication Sig Start Date End Date Taking? Authorizing Provider  albuterol (PROVENTIL HFA;VENTOLIN HFA) 108 (90 BASE) MCG/ACT inhaler Inhale 2 puffs into the lungs every 6 (six) hours as needed for wheezing or shortness of breath.     [provider]  amLODipine (NORVASC) 2.5 MG tablet Take 2.5 mg by mouth daily.    [provider]  aspirin EC 81 MG tablet Take 81 mg by mouth daily. Swallow whole.    [provider]  atorvastatin (LIPITOR) 10 MG tablet Take 10 mg by mouth daily.    [provider]  b complex vitamins capsule Take 1 capsule by mouth daily.    [provider]  Blood Glucose Monitoring Suppl (FORA V30A BLOOD GLUCOSE SYSTEM) DEVI by Does not apply route.    [provider]  brimonidine (ALPHAGAN) 0.2 % ophthalmic solution Place 1 drop into the right eye in the morning and at bedtime.     [provider]  candesartan (ATACAND) 32 MG tablet Take 32 mg by mouth daily. 11/19/17   [provider]  cetirizine (ZYRTEC) 10 MG tablet Take 10 mg by mouth daily.    [provider]  cloNIDine (CATAPRES) 0.1 MG tablet Take 0.1 mg by mouth 3 (three) times daily. 07/26/20   [provider]  diltiazem (CARDIZEM CD) 300 MG 24 hr capsule Take 300 mg by mouth daily.     [provider]  dorzolamide-timolol (COSOPT) 22.3-6.8 MG/ML ophthalmic solution Place 1 drop into the right eye 2 (two) times daily. 01/01/20   [provider]  furosemide (LASIX) 40 MG tablet Take 80 mg by mouth daily.     [provider]  glipiZIDE (GLUCOTROL XL) 2.5 MG 24 hr tablet Take 2.5 mg by mouth in the morning and at bedtime.     [provider]  hydrALAZINE (APRESOLINE) 50 MG tablet Take 50 mg by mouth 3 (three) times daily.     [provider]  Iron, Ferrous Gluconate, 256 (28 FE) MG TABS Take 325 mg by mouth daily.     [provider]  latanoprost (XALATAN) 0.005 % ophthalmic solution Place 1 drop into the right eye at bedtime. 01/01/20   [provider]  oxyCODONE-acetaminophen (PERCOCET/ROXICET) 5-325 MG tablet Take 1 tablet by mouth every 4 (four) hours as needed. 08/28/20   Triplett, Tammy, PA-C  pantoprazole (PROTONIX) 40 MG tablet Take 40 mg by mouth daily.     [provider]  Potassium Chloride ER 20 MEQ TBCR Take 30 mEq by mouth daily as needed (As needed with Lasix).     [provider]    Physical Exam: Vitals:   08/31/20 1306  BP: (!) 138/46  Pulse: (!) 52  Resp: 18  Temp: 98.7 F (37.1 C)  TempSrc: Oral  SpO2: 100%  Weight: 102.5 kg   Height: 5\' 7"  (1.702 m)    Constitutional: NAD, calm, comfortable, obese, blind Vitals:   08/31/20 1306  BP: (!) 138/46  Pulse: (!) 52  Resp: 18  Temp: 98.7 F (37.1 C)  TempSrc: Oral  SpO2: 100%  Weight: 102.5 kg  Height: 5\' 7"  (1.702 m)   Eyes: lids and conjunctivae normal Neck: normal, supple Respiratory: clear to auscultation bilaterally. Normal respiratory effort. No accessory muscle use.  Cardiovascular: Irregular rhythm, bradycardic, no murmurs. Abdomen: no tenderness, no distention. Bowel sounds positive.  Musculoskeletal: Right arm  in cast/dressings. Skin: no rashes, lesions, ulcers.  Psychiatric: Flat affect  Labs on Admission: I have personally reviewed following labs and imaging studies  CBC: Recent Labs  Lab 08/28/20 1107 08/31/20 1356  WBC 9.5 7.7  NEUTROABS 7.4  --   HGB 10.6* 9.0*  HCT 33.5* 28.2*  MCV 100.9* 102.5*  PLT 234 384   Basic Metabolic Panel: Recent Labs  Lab 08/28/20 1107 08/31/20 1356  NA 133* 137  K 5.9* 4.1  CL 105 107  CO2 22 22  GLUCOSE 242* 140*  BUN 59* 64*  CREATININE 2.10* 2.29*  CALCIUM 8.1* 8.6*   GFR: Estimated Creatinine Clearance: 26.9 mL/min (A) (by C-G formula based on SCr of 2.29 mg/dL (H)). Liver Function Tests: No results for input(s): AST, ALT, ALKPHOS, BILITOT, PROT, ALBUMIN in the last 168 hours. No results for input(s): LIPASE, AMYLASE in the last 168 hours. No results for input(s): AMMONIA in the last 168 hours. Coagulation Profile: No results for input(s): INR, PROTIME in the last 168 hours. Cardiac Enzymes: No results for input(s): CKTOTAL, CKMB, CKMBINDEX, TROPONINI in the last 168 hours. BNP (last 3 results) No results for input(s): PROBNP in the last 8760 hours. HbA1C: No results for input(s): HGBA1C in the last 72 hours. CBG: No results for input(s): GLUCAP in the last 168 hours. Lipid Profile: No results for input(s): CHOL, HDL, LDLCALC, TRIG, CHOLHDL, LDLDIRECT in the last 72  hours. Thyroid Function Tests: No results for input(s): TSH, T4TOTAL, FREET4, T3FREE, THYROIDAB in the last 72 hours. Anemia Panel: No results for input(s): VITAMINB12, FOLATE, FERRITIN, TIBC, IRON, RETICCTPCT in the last 72 hours. Urine analysis: No results found for: COLORURINE, APPEARANCEUR, LABSPEC, PHURINE, GLUCOSEU, HGBUR, BILIRUBINUR, KETONESUR, PROTEINUR, UROBILINOGEN, NITRITE, LEUKOCYTESUR  Radiological Exams on Admission: No results found.  EKG: Independently reviewed. Atrial fibrillation 44bpm.  Assessment/Plan Active Problems:   Humeral fracture    Right upper extremity fractures -In need of closed reduction per orthopedics under anesthesia -PT/OT evaluation as patient lives alone and uses her right hand as her dominant arm  AKI versus CKD -Possible stage IV -Monitor strict I's and O's -Gentle, time-limited IV fluid -Recheck a.m. labs  Type 2 diabetes -No significant hyperglycemia noted -Carb modified diet -SSI coverage  History of atrial fibrillation with bradycardia -Hold Cardizem for now -Not on anticoagulation as she has prior watchman device -Monitor on telemetry  History of hypertension-controlled -Continue on home blood pressure medications with the exception of Lasix and ARB as well as diltiazem -IV hydralazine as needed for significant elevations  GERD -Continue Protonix  Iron deficiency anemia -Continue iron supplementation -Monitor repeat CBC  Obesity -Lifestyle changes outpatient  Legally blind -Likely will require placement on discharge given inability to use her dominant arm -Continue eyedrops   DVT prophylaxis: SCDs Code Status: Full Family Communication: None at bedside Disposition Plan:Admit for medical stabilization prior to closed reduction Consults called:Orthopedics Admission status: Inpatient, Tele  Ane Conerly D Sary Bogie DO [facility]  If 7PM-7AM, please contact night-coverage [email or web address]  08/31/2020, 2:37 PM

## 2020-09-01 ENCOUNTER — Encounter (HOSPITAL_COMMUNITY): Payer: Self-pay | Admitting: Internal Medicine

## 2020-09-01 ENCOUNTER — Inpatient Hospital Stay (HOSPITAL_COMMUNITY): Payer: Medicare Other | Admitting: Anesthesiology

## 2020-09-01 ENCOUNTER — Encounter (HOSPITAL_COMMUNITY)
Admission: AD | Disposition: A | Discharge status: Skilled Nursing Facility | Payer: Self-pay | Source: Ambulatory Visit | Attending: Internal Medicine

## 2020-09-01 ENCOUNTER — Inpatient Hospital Stay (HOSPITAL_COMMUNITY): Payer: Medicare Other

## 2020-09-01 DIAGNOSIS — S52531A Colles' fracture of right radius, initial encounter for closed fracture: Secondary | ICD-10-CM

## 2020-09-01 DIAGNOSIS — S42321A Displaced transverse fracture of shaft of humerus, right arm, initial encounter for closed fracture: Secondary | ICD-10-CM

## 2020-09-01 DIAGNOSIS — S52601A Unspecified fracture of lower end of right ulna, initial encounter for closed fracture: Secondary | ICD-10-CM

## 2020-09-01 HISTORY — PX: CLOSED REDUCTION WRIST FRACTURE: SHX1091

## 2020-09-01 HISTORY — PX: CLOSED REDUCTION HUMERUS FRACTURE: SHX985

## 2020-09-01 LAB — BASIC METABOLIC PANEL
Anion gap: 7 (ref 5–15)
BUN: 59 mg/dL — ABNORMAL HIGH (ref 8–23)
CO2: 23 mmol/L (ref 22–32)
Calcium: 8.6 mg/dL — ABNORMAL LOW (ref 8.9–10.3)
Chloride: 109 mmol/L (ref 98–111)
Creatinine, Ser: 2.15 mg/dL — ABNORMAL HIGH (ref 0.44–1.00)
GFR, Estimated: 24 mL/min — ABNORMAL LOW (ref >60)
Glucose, Bld: 113 mg/dL — ABNORMAL HIGH (ref 70–99)
Potassium: 4.8 mmol/L (ref 3.5–5.1)
Sodium: 139 mmol/L (ref 135–145)

## 2020-09-01 LAB — GLUCOSE, CAPILLARY
Glucose-Capillary: 100 mg/dL — ABNORMAL HIGH (ref 70–99)
Glucose-Capillary: 112 mg/dL — ABNORMAL HIGH (ref 70–99)
Glucose-Capillary: 113 mg/dL — ABNORMAL HIGH (ref 70–99)
Glucose-Capillary: 115 mg/dL — ABNORMAL HIGH (ref 70–99)
Glucose-Capillary: 156 mg/dL — ABNORMAL HIGH (ref 70–99)
Glucose-Capillary: 166 mg/dL — ABNORMAL HIGH (ref 70–99)

## 2020-09-01 LAB — CBC
HCT: 28.4 % — ABNORMAL LOW (ref 36.0–46.0)
Hemoglobin: 9 g/dL — ABNORMAL LOW (ref 12.0–15.0)
MCH: 32.3 pg (ref 26.0–34.0)
MCHC: 31.7 g/dL (ref 30.0–36.0)
MCV: 101.8 fL — ABNORMAL HIGH (ref 80.0–100.0)
Platelets: 185 10*3/uL (ref 150–400)
RBC: 2.79 MIL/uL — ABNORMAL LOW (ref 3.87–5.11)
RDW: 13.2 % (ref 11.5–15.5)
WBC: 6.3 10*3/uL (ref 4.0–10.5)
nRBC: 0 % (ref 0.0–0.2)

## 2020-09-01 LAB — MAGNESIUM: Magnesium: 2.1 mg/dL (ref 1.7–2.4)

## 2020-09-01 SURGERY — CLOSED REDUCTION, WRIST
Anesthesia: Monitor Anesthesia Care | Site: Wrist | Laterality: Right

## 2020-09-01 MED ORDER — ONDANSETRON HCL 4 MG/2ML IJ SOLN
INTRAMUSCULAR | Status: DC | PRN
  Administered 2020-09-01: 4 mg via INTRAVENOUS

## 2020-09-01 MED ORDER — FENTANYL CITRATE (PF) 100 MCG/2ML IJ SOLN
25.0000 ug | INTRAMUSCULAR | Status: DC | PRN
  Administered 2020-09-01 (×2): 50 ug via INTRAVENOUS

## 2020-09-01 MED ORDER — PROPOFOL 10 MG/ML IV BOLUS
INTRAVENOUS | Status: AC
  Filled 2020-09-01: qty 40

## 2020-09-01 MED ORDER — ALBUTEROL SULFATE HFA 108 (90 BASE) MCG/ACT IN AERS
INHALATION_SPRAY | RESPIRATORY_TRACT | Status: AC
  Filled 2020-09-01: qty 6.7

## 2020-09-01 MED ORDER — ALBUTEROL SULFATE HFA 108 (90 BASE) MCG/ACT IN AERS
INHALATION_SPRAY | RESPIRATORY_TRACT | Status: DC | PRN
  Administered 2020-09-01: 4 via RESPIRATORY_TRACT

## 2020-09-01 MED ORDER — SUCCINYLCHOLINE CHLORIDE 200 MG/10ML IV SOSY
PREFILLED_SYRINGE | INTRAVENOUS | Status: AC
  Filled 2020-09-01: qty 10

## 2020-09-01 MED ORDER — CHLORHEXIDINE GLUCONATE 0.12 % MT SOLN
OROMUCOSAL | Status: AC
  Administered 2020-09-01: 15 mL via OROMUCOSAL
  Filled 2020-09-01: qty 15

## 2020-09-01 MED ORDER — LIDOCAINE HCL (PF) 2 % IJ SOLN
INTRAMUSCULAR | Status: AC
  Filled 2020-09-01: qty 5

## 2020-09-01 MED ORDER — MEPIVACAINE HCL (PF) 1.5 % IJ SOLN
INTRAMUSCULAR | Status: AC
  Filled 2020-09-01: qty 30

## 2020-09-01 MED ORDER — ORAL CARE MOUTH RINSE: 15.0000 mL | Freq: Once | OROMUCOSAL | Status: AC

## 2020-09-01 MED ORDER — MIDAZOLAM HCL 2 MG/2ML IJ SOLN: 1.0000 mg | Freq: Once | INTRAMUSCULAR | Status: AC

## 2020-09-01 MED ORDER — LIDOCAINE HCL (PF) 1 % IJ SOLN
INTRAMUSCULAR | Status: DC | PRN
  Administered 2020-09-01: 3 mL

## 2020-09-01 MED ORDER — ONDANSETRON HCL 4 MG/2ML IJ SOLN: 4.0000 mg | Freq: Once | INTRAMUSCULAR | Status: DC | PRN

## 2020-09-01 MED ORDER — LIDOCAINE HCL 1 % IJ SOLN: INTRAMUSCULAR | Status: DC | PRN

## 2020-09-01 MED ORDER — MIDAZOLAM HCL 2 MG/2ML IJ SOLN
INTRAMUSCULAR | Status: AC
  Administered 2020-09-01: 1 mg via INTRAVENOUS
  Filled 2020-09-01: qty 2

## 2020-09-01 MED ORDER — BUPIVACAINE HCL (PF) 0.5 % IJ SOLN
INTRAMUSCULAR | Status: AC
  Filled 2020-09-01: qty 30

## 2020-09-01 MED ORDER — FENTANYL CITRATE (PF) 100 MCG/2ML IJ SOLN
INTRAMUSCULAR | Status: AC
  Filled 2020-09-01: qty 2

## 2020-09-01 MED ORDER — LACTATED RINGERS IV SOLN: INTRAVENOUS | Status: DC | PRN

## 2020-09-01 MED ORDER — MEPIVACAINE HCL (PF) 1.5 % IJ SOLN
INTRAMUSCULAR | Status: DC | PRN
  Administered 2020-09-01: 30 mL via PERINEURAL

## 2020-09-01 MED ORDER — LIDOCAINE HCL (PF) 1 % IJ SOLN
INTRAMUSCULAR | Status: AC
  Filled 2020-09-01: qty 30

## 2020-09-01 MED ORDER — FENTANYL CITRATE (PF) 100 MCG/2ML IJ SOLN: 50.0000 ug | INTRAMUSCULAR | Status: DC | PRN

## 2020-09-01 MED ORDER — ONDANSETRON HCL 4 MG/2ML IJ SOLN
INTRAMUSCULAR | Status: AC
  Filled 2020-09-01: qty 2

## 2020-09-01 MED ORDER — ROPIVACAINE HCL 5 MG/ML IJ SOLN
INTRAMUSCULAR | Status: AC
  Filled 2020-09-01: qty 30

## 2020-09-01 MED ORDER — LIDOCAINE HCL (CARDIAC) PF 100 MG/5ML IV SOSY
PREFILLED_SYRINGE | INTRAVENOUS | Status: DC | PRN
  Administered 2020-09-01: 40 mg via INTRATRACHEAL

## 2020-09-01 MED ORDER — PROPOFOL 10 MG/ML IV BOLUS
INTRAVENOUS | Status: DC | PRN
  Administered 2020-09-01: 20 mg via INTRAVENOUS
  Administered 2020-09-01: 90 mg via INTRAVENOUS

## 2020-09-01 MED ORDER — FENTANYL CITRATE (PF) 100 MCG/2ML IJ SOLN
INTRAMUSCULAR | Status: AC
  Administered 2020-09-01: 50 ug via INTRAVENOUS
  Filled 2020-09-01: qty 2

## 2020-09-01 MED ORDER — IPRATROPIUM-ALBUTEROL 0.5-2.5 (3) MG/3ML IN SOLN: 3.0000 mL | Freq: Once | RESPIRATORY_TRACT | Status: DC

## 2020-09-01 MED ORDER — SODIUM CHLORIDE 0.9 % IV SOLN: INTRAVENOUS | Status: DC

## 2020-09-01 MED ORDER — CHLORHEXIDINE GLUCONATE 0.12 % MT SOLN: 15.0000 mL | Freq: Once | OROMUCOSAL | Status: AC

## 2020-09-01 MED ORDER — LIDOCAINE HCL (PF) 1 % IJ SOLN: INTRAMUSCULAR | Status: DC | PRN

## 2020-09-01 MED ORDER — SUCCINYLCHOLINE CHLORIDE 20 MG/ML IJ SOLN
INTRAMUSCULAR | Status: DC | PRN
  Administered 2020-09-01: 120 mg via INTRAVENOUS

## 2020-09-01 SURGICAL SUPPLY — 18 items
BANDAGE ELASTIC 4 VELCRO NS (GAUZE/BANDAGES/DRESSINGS) ×2 IMPLANT
BANDAGE ELASTIC 6 VELCRO NS (GAUZE/BANDAGES/DRESSINGS) ×1 IMPLANT
BNDG CMPR STD VLCR NS LF 5.8X3 (GAUZE/BANDAGES/DRESSINGS) ×4
BNDG ELASTIC 3X5.8 VLCR NS LF (GAUZE/BANDAGES/DRESSINGS) ×2 IMPLANT
BNDG GAUZE ELAST 4 BULKY (GAUZE/BANDAGES/DRESSINGS) ×1 IMPLANT
BNDG PLASTER X FAST 6X5 WHT LF (CAST SUPPLIES) ×2 IMPLANT
BNDG PLSTR 5X6 XFST ST WHT LF (CAST SUPPLIES) ×4
KIT TURNOVER KIT A (KITS) ×3 IMPLANT
NDL HYPO 18GX1.5 BLUNT FILL (NEEDLE) IMPLANT
NEEDLE HYPO 18GX1.5 BLUNT FILL (NEEDLE) ×3 IMPLANT
NEEDLE HYPO 22GX1.5 SAFETY (NEEDLE) ×1 IMPLANT
PAD ABD 5X9 TENDERSORB (GAUZE/BANDAGES/DRESSINGS) ×2 IMPLANT
PADDING WEBRIL 4 STERILE (GAUZE/BANDAGES/DRESSINGS) ×4 IMPLANT
PADDING WEBRIL 6 STERILE (GAUZE/BANDAGES/DRESSINGS) ×2 IMPLANT
SPLINT PLASTER CAST XFAST 4X15 (CAST SUPPLIES) IMPLANT
SPLINT PLASTER XTRA FAST SET 4 (CAST SUPPLIES) ×3
SYR CONTROL 10ML LL (SYRINGE) ×1 IMPLANT
WATER STERILE IRR 1000ML POUR (IV SOLUTION) ×3 IMPLANT

## 2020-09-01 NOTE — Anesthesia Procedure Notes (Addendum)
Procedure Name: Intubation Date/Time: 09/01/2020 11:30 AM Performed by: Karna Dupes, CRNA Pre-anesthesia Checklist: Patient identified, Emergency Drugs available, Suction available and Patient being monitored Patient Re-evaluated:Patient Re-evaluated prior to induction Oxygen Delivery Method: Circle system utilized Preoxygenation: Pre-oxygenation with 100% oxygen Induction Type: IV induction Laryngoscope Size: Mac and 3 Grade View: Grade II Tube type: Oral Tube size: 7.0 mm Number of attempts: 2 Airway Equipment and Method: Stylet Placement Confirmation: ETT inserted through vocal cords under direct vision, positive ETCO2 and breath sounds checked- equal and bilateral Secured at: 21 cm Tube secured with: Tape Dental Injury: Teeth and Oropharynx as per pre-operative assessment

## 2020-09-01 NOTE — Anesthesia Preprocedure Evaluation (Addendum)
Anesthesia Evaluation  Patient identified by MRN, date of birth, ID band Patient awake    Reviewed: Allergy & Precautions, NPO status , Patient's Chart, lab work & pertinent test results  Airway Mallampati: II  TM Distance: >3 FB Neck ROM: Full    Dental  (+) Dental Advisory Given, Missing   Pulmonary COPD, former smoker,    Pulmonary exam normal breath sounds clear to auscultation       Cardiovascular Exercise Tolerance: Good hypertension, Pt. on medications + Peripheral Vascular Disease, +CHF and + DVT  + dysrhythmias Atrial Fibrillation + Valvular Problems/Murmurs  Rhythm:Irregular Rate:Normal     Neuro/Psych CVA, Residual Symptoms    GI/Hepatic GERD  Medicated and Controlled,  Endo/Other  diabetes, Poorly Controlled  Renal/GU ESRF and CRFRenal disease     Musculoskeletal   Abdominal   Peds  Hematology  (+) anemia ,   Anesthesia Other Findings   Reproductive/Obstetrics                            Anesthesia Physical Anesthesia Plan  ASA: 4  Anesthesia Plan: Regional and MAC   Post-op Pain Management:    Induction:   PONV Risk Score and Plan: Ondansetron  Airway Management Planned: Nasal Cannula, Natural Airway and Simple Face Mask  Additional Equipment:   Intra-op Plan:   Post-operative Plan:   Informed Consent: I have reviewed the patients History and Physical, chart, labs and discussed the procedure including the risks, benefits and alternatives for the proposed anesthesia with the patient or authorized representative who has indicated his/her understanding and acceptance.     Dental advisory given  Plan Discussed with: CRNA and Surgeon  Anesthesia Plan Comments:         Anesthesia Quick Evaluation

## 2020-09-01 NOTE — Op Note (Signed)
Orthopaedic Surgery Operative Note (CSN: [id number])  Inez Catalina T Walrond  25-Sep-1945 Date of Surgery: 09/01/2020   Diagnoses:  Right midshaft humerus fracture and right dital radius and ulna fracture  Procedure: Closed reduction and splinting of right distal radius and ulna fractures Closed reduction and splinting of right humerus shaft fracture   Operative Finding Successful completion of the planned procedure.  Improved alignment of right distal radius, ulna and humerus shaft fractures.   Post-Op Diagnosis: Same Surgeons:Primary: Mordecai Rasmussen, MD Assistants: Marquita Palms Location: [facility] OR ROOM 3 Anesthesia: General with regional anesthesia Antibiotics:  None indicated Tourniquet time: N/A Estimated Blood Loss: None Complications: None Specimens: None Implants: * No implants in log *  Indications for Surgery:   Bethany [name] is a 75 y.o. female who sustained a right humerus shaft fracture as well as a right distal radius and ulna fractures.  Both fractures were displaced and attempted reduction and splinting in the ED did not maintain acceptable alignment.  Patient has several medical comorbidities and is too sick to proceed with open reduction and internal fixation.  However, the fractures needed to be reduced and placed in appropriate splints with plans to transition to a fracture brace at a later date.  Benefits and risks of operative and nonoperative management were discussed prior to surgery with patient, and informed consent form was completed.  Specific risks including infection, need for additional surgery, malunion, nonunion, damage to surrounding structures (specifically the radial nerve) and more severe complications associated with anesthesia were discussed.  She was consented for closed reduction of her fractures.    Procedure:   The patient was identified properly. Informed consent was obtained and the surgical site was marked. The patient was taken up to suite where general  anesthesia was induced.  The patient was positioned supine.  Timeout was performed before the beginning of the case.   Tourniquet was not needed for the case.  No antibiotics were indicated.   We started with the distal radius.  Her hand was placed in traction and allowed to hand for several minutes.  We completed a reduction maneuver, which demonstrated improved alignment.  We continued to pull in line traction, with ulna deviation, which improved her alignment.  We then placed a well padded splint and provided an appropriate mold to maintain the reduction.  Fluoroscopic images demonstrated acceptable alignment.  We then turned our attention to humeral shaft fracture.  The arm was held at 90 degrees at the elbow. Fluoroscopic images demonstrated improved alignment.  We then placed a well padded coaptation splint that went into the axilla, and up on to the deltoid.  We placed a valgus mold on the fracture and maintained this mold until the splint material had fully cured.  Final fluoroscopic images demonstrated improved alignment. She was placed back into a sling.   Patient was awoken taken to PACU in stable condition.   Post-operative plan:  The patient will be admitted back to the floor. Non weightbearing on the RUE.  Sling at all times.   Monitor skin, especially in the axilla for signs of pressure or breakdown.    DVT prophylaxis per primary team, no orthopedic contraindications.    Pain control with PRN pain medication preferring oral medicines.   Follow up plan will be scheduled in approximately 7 days for XR and skin check.

## 2020-09-01 NOTE — Transfer of Care (Signed)
Immediate Anesthesia Transfer of Care Note  Patient: Bethany [name]  Procedure(s) Performed: CLOSED REDUCTION WRIST (Right: Wrist) CLOSED REDUCTION HUMERAL SHAFT (Right: Arm Upper)  Patient Location: PACU  Anesthesia Type:General  Level of Consciousness: awake and alert   Airway & Oxygen Therapy: Patient Spontanous Breathing and Patient connected to nasal cannula oxygen  Post-op Assessment: Report given to RN and Post -op Vital signs reviewed and stable  Post vital signs: Reviewed and stable  Last Vitals:  Vitals Value Taken Time  BP 171/94 09/01/20 1224  Temp    Pulse 91 09/01/20 1226  Resp 19 09/01/20 1226  SpO2 98 % 09/01/20 1226  Vitals shown include unvalidated device data.  Last Pain:  Vitals:   09/01/20 0841  TempSrc:   PainSc: Asleep      Patients Stated Pain Goal: 2 (60/60/04 5997)  Complications: No notable events documented.

## 2020-09-01 NOTE — Anesthesia Postprocedure Evaluation (Signed)
Anesthesia Post Note  Patient: Brayleigh Rybacki Buzby  Procedure(s) Performed: CLOSED REDUCTION WRIST (Right: Wrist) CLOSED REDUCTION HUMERAL SHAFT (Right: Arm Upper)  Patient location during evaluation: PACU Anesthesia Type: Regional and General Level of consciousness: awake and alert and oriented Pain management: pain level controlled Vital Signs Assessment: post-procedure vital signs reviewed and stable Respiratory status: spontaneous breathing, respiratory function stable and patient connected to nasal cannula oxygen Cardiovascular status: blood pressure returned to baseline and stable Postop Assessment: no headache and no backache Anesthetic complications: no   No notable events documented.   Last Vitals:  Vitals:   09/01/20 1330 09/01/20 1402  BP: (!) 197/93 (!) 163/67  Pulse: 88 85  Resp: 15 16  Temp: 37.1 C   SpO2: 96% 99%    Last Pain:  Vitals:   09/01/20 1358  TempSrc:   PainSc: 10-Worst pain ever                 Devan Danzer C Phylliss Strege

## 2020-09-01 NOTE — Progress Notes (Signed)
PROGRESS NOTE    Bethany [name]  WIO:[id number] DOB: 1945/06/30 DOA: 08/31/2020 PCP: Glenda Chroman, MD   Brief Narrative:   Per HPI: Bethany [name] is a 75 y.o. female with medical history significant for atrial fibrillation, anemia, COPD, type 2 diabetes, hypertension, prior CVA, and GERD who presented to the ED on 7/3 for evaluation of right shoulder pain secondary to mechanical fall.  She was noted to have a displaced right distal radius fracture as well as displaced right distal ulna fracture and right humeral shaft fracture which was to be further evaluated by orthopedics in the outpatient setting.  She was seen today on 7/6 and she was noted to have insufficient reduction of the humeral shaft fracture as well as the distal radius and ulna fractures.  Plan is to perform closed reduction under general anesthesia and/or IV sedation, but given her multiple medical comorbidities she will be admitted to the hospital for further lab work and evaluation prior to the procedure.  Of note, she is legally blind.  Assessment & Plan:   Active Problems:   Humeral fracture   Right upper extremity fractures -In need of closed reduction per orthopedics under anesthesia today -PT/OT evaluation as patient lives alone and uses her right hand as her dominant arm   CKD IV -Monitor strict I's and O's -Recheck a.m. labs   Type 2 diabetes -No significant hyperglycemia noted -Carb modified diet -SSI coverage   History of atrial fibrillation with bradycardia -Hold Cardizem for now and likely dc on discharge or decrease dose -Not on anticoagulation as she has prior watchman device -Monitor on telemetry   History of hypertension-controlled -Continue on home blood pressure medications with the exception of Lasix and ARB as well as diltiazem -May resume Lasix and ARB on discharge -IV hydralazine as needed for significant elevations   GERD -Continue Protonix   Iron deficiency anemia-stable -Continue  iron supplementation -Monitor repeat CBC   Obesity -Lifestyle changes outpatient   Legally blind -Likely will require placement on discharge given inability to use her dominant arm -Continue eyedrops    DVT prophylaxis:SCDs Code Status: Full Family Communication: Daughter Jenny Reichmann at bedside on 7/6 Disposition Plan:  Status is: Inpatient  Remains inpatient appropriate because:Ongoing diagnostic testing needed not appropriate for outpatient work up and IV treatments appropriate due to intensity of illness or inability to take PO  Dispo: The patient is from: Home              Anticipated d/c is to: SNF              Patient currently is not medically stable to d/c.   Difficult to place patient No   Consultants:  Orthopedics  Procedures:  See below  Antimicrobials:  None   Subjective: Patient seen and evaluated today with no new acute complaints or concerns. No acute concerns or events noted overnight. She complains of some R arm pain this am.  Objective: Vitals:   08/31/20 1830 08/31/20 2055 09/01/20 0104 09/01/20 0519  BP: (!) 149/49 (!) 150/43 (!) 112/47 (!) 141/65  Pulse: 68 63 60 (!) 52  Resp: 18 19 19 19   Temp: 98.3 F (36.8 C) 98.5 F (36.9 C) 98.7 F (37.1 C) 99.9 F (37.7 C)  TempSrc: Oral Oral Oral Oral  SpO2: 99% 98% 100% 92%  Weight:      Height:        Intake/Output Summary (Last 24 hours) at 09/01/2020 0904 Last data filed at 09/01/2020 [phone]  Gross per 24 hour  Intake --  Output 600 ml  Net -600 ml   Filed Weights   08/31/20 1306  Weight: 102.5 kg    Examination:  General exam: Appears calm and comfortable, legally blind Respiratory system: Clear to auscultation. Respiratory effort normal. Cardiovascular system: S1 & S2 heard, RRR.  Gastrointestinal system: Abdomen is soft Central nervous system: Alert and awake Extremities: No edema, R arm in sling Skin: No significant lesions noted Psychiatry: Flat affect.    Data Reviewed: I have  personally reviewed following labs and imaging studies  CBC: Recent Labs  Lab 08/28/20 1107 08/31/20 1356 09/01/20 0611  WBC 9.5 7.7 6.3  NEUTROABS 7.4  --   --   HGB 10.6* 9.0* 9.0*  HCT 33.5* 28.2* 28.4*  MCV 100.9* 102.5* 101.8*  PLT 234 175 096   Basic Metabolic Panel: Recent Labs  Lab 08/28/20 1107 08/31/20 1356 09/01/20 0611  NA 133* 137 139  K 5.9* 4.1 4.8  CL 105 107 109  CO2 22 22 23   GLUCOSE 242* 140* 113*  BUN 59* 64* 59*  CREATININE 2.10* 2.29* 2.15*  CALCIUM 8.1* 8.6* 8.6*  MG  --   --  2.1   GFR: Estimated Creatinine Clearance: 28.7 mL/min (A) (by C-G formula based on SCr of 2.15 mg/dL (H)). Liver Function Tests: No results for input(s): AST, ALT, ALKPHOS, BILITOT, PROT, ALBUMIN in the last 168 hours. No results for input(s): LIPASE, AMYLASE in the last 168 hours. No results for input(s): AMMONIA in the last 168 hours. Coagulation Profile: No results for input(s): INR, PROTIME in the last 168 hours. Cardiac Enzymes: No results for input(s): CKTOTAL, CKMB, CKMBINDEX, TROPONINI in the last 168 hours. BNP (last 3 results) No results for input(s): PROBNP in the last 8760 hours. HbA1C: Recent Labs    08/31/20 1356  HGBA1C 6.6*   CBG: Recent Labs  Lab 08/31/20 1630 08/31/20 2054 09/01/20 0053 09/01/20 0720  GLUCAP 186* 215* 100* 113*   Lipid Profile: No results for input(s): CHOL, HDL, LDLCALC, TRIG, CHOLHDL, LDLDIRECT in the last 72 hours. Thyroid Function Tests: No results for input(s): TSH, T4TOTAL, FREET4, T3FREE, THYROIDAB in the last 72 hours. Anemia Panel: No results for input(s): VITAMINB12, FOLATE, FERRITIN, TIBC, IRON, RETICCTPCT in the last 72 hours. Sepsis Labs: No results for input(s): PROCALCITON, LATICACIDVEN in the last 168 hours.  Recent Results (from the past 240 hour(s))  Resp Panel by RT-PCR (Flu A&B, Covid) Nasopharyngeal Swab     Status: None   Collection Time: 08/31/20  2:55 PM   Specimen: Nasopharyngeal Swab;  Nasopharyngeal(NP) swabs in vial transport medium  Result Value Ref Range Status   SARS Coronavirus 2 by RT PCR NEGATIVE NEGATIVE Final    Comment: (NOTE) SARS-CoV-2 target nucleic acids are NOT DETECTED.  The SARS-CoV-2 RNA is generally detectable in upper respiratory specimens during the acute phase of infection. The lowest concentration of SARS-CoV-2 viral copies this assay can detect is 138 copies/mL. A negative result does not preclude SARS-Cov-2 infection and should not be used as the sole basis for treatment or other patient management decisions. A negative result may occur with  improper specimen collection/handling, submission of specimen other than nasopharyngeal swab, presence of viral mutation(s) within the areas targeted by this assay, and inadequate number of viral copies(<138 copies/mL). A negative result must be combined with clinical observations, patient history, and epidemiological information. The expected result is Negative.  Fact Sheet for Patients:  EntrepreneurPulse.com.au  Fact Sheet for Healthcare Providers:  IncredibleEmployment.be  This test is no t yet approved or cleared by the [place] and  has been authorized for detection and/or diagnosis of SARS-CoV-2 by FDA under an Emergency Use Authorization (EUA). This EUA will remain  in effect (meaning this test can be used) for the duration of the COVID-19 declaration under Section 564(b)(1) of the Act, 21 U.S.C.section 360bbb-3(b)(1), unless the authorization is terminated  or revoked sooner.       Influenza A by PCR NEGATIVE NEGATIVE Final   Influenza B by PCR NEGATIVE NEGATIVE Final    Comment: (NOTE) The Xpert Xpress SARS-CoV-2/FLU/RSV plus assay is intended as an aid in the diagnosis of influenza from Nasopharyngeal swab specimens and should not be used as a sole basis for treatment. Nasal washings and aspirates are unacceptable for Xpert Xpress  SARS-CoV-2/FLU/RSV testing.  Fact Sheet for Patients: EntrepreneurPulse.com.au  Fact Sheet for Healthcare Providers: IncredibleEmployment.be  This test is not yet approved or cleared by the [place] FDA and has been authorized for detection and/or diagnosis of SARS-CoV-2 by FDA under an Emergency Use Authorization (EUA). This EUA will remain in effect (meaning this test can be used) for the duration of the COVID-19 declaration under Section 564(b)(1) of the Act, 21 U.S.C. section 360bbb-3(b)(1), unless the authorization is terminated or revoked.  Performed at [facility], [address], [place], [place] [address]          Radiology Studies: No results found.      Scheduled Meds:  amLODipine  2.5 mg Oral Daily   aspirin EC  81 mg Oral Daily   atorvastatin  10 mg Oral q1800   B-complex with vitamin C  1 tablet Oral Daily   brimonidine  1 drop Right Eye BID   cloNIDine  0.1 mg Oral TID   dorzolamide-timolol  1 drop Right Eye BID   ferrous gluconate  325 mg Oral Daily   hydrALAZINE  50 mg Oral TID   insulin aspart  0-15 Units Subcutaneous Q4H   latanoprost  1 drop Right Eye QHS   loratadine  10 mg Oral Daily   pantoprazole  40 mg Oral Daily     LOS: 1 day    Time spent: 35 minutes    Nour Rodrigues Darleen Crocker, DO [facility]  If 7PM-7AM, please contact night-coverage [email or web address] 09/01/2020, 9:04 AM

## 2020-09-01 NOTE — Anesthesia Procedure Notes (Signed)
Anesthesia Regional Block: Supraclavicular block   Pre-Anesthetic Checklist: , timeout performed,  Correct Patient, Correct Site, Correct Laterality,  Correct Procedure, Correct Position, site marked,  Risks and benefits discussed,  At surgeon's request and post-op pain management  Laterality: Right  Prep: chloraprep       Needles:  Injection technique: Single-shot  Needle Type: Echogenic Needle     Needle Length: 8.3cm  Needle Gauge: 22   Needle insertion depth: 5 cm   Additional Needles:   Procedures:,,,, ultrasound used (permanent image in chart),,    Narrative:  Start time: 09/01/2020 10:43 AM End time: 09/01/2020 8:53 PM Injection made incrementally with aspirations every 5 mL.  Performed by: Personally  Anesthesiologist: Denese Killings, MD  Additional Notes: Block assessed prior to start of surgery, c/o pain in her wrist, partially failed block

## 2020-09-01 NOTE — Progress Notes (Signed)
   ORTHOPAEDIC PROGRESS NOTE  Scheduled for Procedure(s): CLOSED REDUCTION right distal radius and right humerus shaft  DOS: 09/01/20  SUBJECTIVE: Patient resting comfortably.  No issues over night.  Pain is controlled.  Consent has been signed, no additional questions.  NPO since midnight.  OBJECTIVE: PE:  Resting comfortably.  No acute distress  RUE in a splint, clean, dry and intact. Fingers are warm and well perfused.   Flexion and extension intact to exposed fingers. Sensation intact distally.   Vitals:   09/01/20 0104 09/01/20 0519  BP: (!) 112/47 (!) 141/65  Pulse: 60 (!) 52  Resp: 19 19  Temp: 98.7 F (37.1 C) 99.9 F (37.7 C)  SpO2: 100% 92%     ASSESSMENT: Bethany [name] is a 75 y.o. female doing well, ready for OR  PLAN: Weightbearing: NWB RUE Insicional and dressing care: Splint left intact until follow-up Orthopedic device(s): Splint VTE prophylaxis: At discretion of primary team Pain control: As needed, prefer PO medications Follow - up plan: 2 weeks after closed reduction    Contact information:     Jamol Ginyard A. Amedeo Kinsman, MD [facility] [address] [place],  [place]  [address] Phone: [phone] Fax: [phone]

## 2020-09-02 ENCOUNTER — Encounter (HOSPITAL_COMMUNITY): Payer: Self-pay | Admitting: Orthopedic Surgery

## 2020-09-02 DIAGNOSIS — S42321D Displaced transverse fracture of shaft of humerus, right arm, subsequent encounter for fracture with routine healing: Secondary | ICD-10-CM

## 2020-09-02 DIAGNOSIS — I10 Essential (primary) hypertension: Secondary | ICD-10-CM

## 2020-09-02 DIAGNOSIS — N184 Chronic kidney disease, stage 4 (severe): Secondary | ICD-10-CM

## 2020-09-02 DIAGNOSIS — I482 Chronic atrial fibrillation, unspecified: Secondary | ICD-10-CM

## 2020-09-02 LAB — CBC
HCT: 31.3 % — ABNORMAL LOW (ref 36.0–46.0)
Hemoglobin: 9.8 g/dL — ABNORMAL LOW (ref 12.0–15.0)
MCH: 32.6 pg (ref 26.0–34.0)
MCHC: 31.3 g/dL (ref 30.0–36.0)
MCV: 104 fL — ABNORMAL HIGH (ref 80.0–100.0)
Platelets: 204 10*3/uL (ref 150–400)
RBC: 3.01 MIL/uL — ABNORMAL LOW (ref 3.87–5.11)
RDW: 13.2 % (ref 11.5–15.5)
WBC: 7.4 10*3/uL (ref 4.0–10.5)
nRBC: 0 % (ref 0.0–0.2)

## 2020-09-02 LAB — GLUCOSE, CAPILLARY
Glucose-Capillary: 142 mg/dL — ABNORMAL HIGH (ref 70–99)
Glucose-Capillary: 158 mg/dL — ABNORMAL HIGH (ref 70–99)
Glucose-Capillary: 162 mg/dL — ABNORMAL HIGH (ref 70–99)
Glucose-Capillary: 165 mg/dL — ABNORMAL HIGH (ref 70–99)
Glucose-Capillary: 168 mg/dL — ABNORMAL HIGH (ref 70–99)
Glucose-Capillary: 179 mg/dL — ABNORMAL HIGH (ref 70–99)

## 2020-09-02 LAB — BASIC METABOLIC PANEL
Anion gap: 8 (ref 5–15)
BUN: 53 mg/dL — ABNORMAL HIGH (ref 8–23)
CO2: 26 mmol/L (ref 22–32)
Calcium: 8.9 mg/dL (ref 8.9–10.3)
Chloride: 107 mmol/L (ref 98–111)
Creatinine, Ser: 1.91 mg/dL — ABNORMAL HIGH (ref 0.44–1.00)
GFR, Estimated: 27 mL/min — ABNORMAL LOW (ref >60)
Glucose, Bld: 143 mg/dL — ABNORMAL HIGH (ref 70–99)
Potassium: 4.7 mmol/L (ref 3.5–5.1)
Sodium: 141 mmol/L (ref 135–145)

## 2020-09-02 LAB — MAGNESIUM: Magnesium: 2.1 mg/dL (ref 1.7–2.4)

## 2020-09-02 MED ORDER — OXYCODONE HCL 5 MG PO TABS
5.0000 mg | ORAL_TABLET | Freq: Four times a day (QID) | ORAL | Status: DC | PRN
  Administered 2020-09-02 – 2020-09-05 (×7): 5 mg via ORAL
  Filled 2020-09-02 (×7): qty 1

## 2020-09-02 MED ORDER — HYDROMORPHONE HCL 1 MG/ML IJ SOLN
1.0000 mg | INTRAMUSCULAR | Status: DC | PRN
Start: 2020-09-02 — End: 2020-09-06

## 2020-09-02 MED ORDER — HEPARIN SODIUM (PORCINE) 5000 UNIT/ML IJ SOLN
5000.0000 [IU] | Freq: Three times a day (TID) | INTRAMUSCULAR | Status: DC
  Administered 2020-09-02 – 2020-09-05 (×8): 5000 [IU] via SUBCUTANEOUS
  Filled 2020-09-02 (×10): qty 1

## 2020-09-02 MED ORDER — METHOCARBAMOL 1000 MG/10ML IJ SOLN
500.0000 mg | Freq: Three times a day (TID) | INTRAVENOUS | Status: DC | PRN
  Filled 2020-09-02: qty 5

## 2020-09-02 MED ORDER — ACETAMINOPHEN 500 MG PO TABS
1000.0000 mg | ORAL_TABLET | Freq: Once | ORAL | Status: AC
  Administered 2020-09-02: 1000 mg via ORAL
  Filled 2020-09-02: qty 2

## 2020-09-02 NOTE — Progress Notes (Addendum)
   ORTHOPAEDIC PROGRESS NOTE  S/P Procedure(s): CLOSED REDUCTION right distal radius and right humerus shaft  DOS: 09/01/20  SUBJECTIVE: Had difficulty resting over night.  Complaining on pain and has been vomiting.  Answering questions appropriately this morning.  Breathing with nasal cannula in place.  No complaints otherwise.   OBJECTIVE: PE:  Resting comfortably.  No acute distress  Breathing comfortably, [place] in place  RUE in a splint, clean, dry and intact. Fingers are warm and well perfused.   Flexion and extension intact to exposed fingers. Sensation intact distally.  Sling fitting well.  Splint not digging into her axilla  Vitals:   09/02/20 0533 09/02/20 0632  BP: (!) 182/86 (!) 178/71  Pulse: 90   Resp: 18   Temp: 100.3 F (37.9 C)   SpO2: 97%      ASSESSMENT: Bethany [name] is a 75 y.o. female stable following anesthesia for closed splinting  PLAN: Weightbearing: NWB RUE Insicional and dressing care: Splint left intact until follow-up Orthopedic device(s): Splint VTE prophylaxis: At discretion of primary team Pain control: As needed, prefer PO medications Follow - up plan: 2 weeks after closed reduction    Contact information:     Berdie Malter A. Amedeo Kinsman, MD [facility] [address] [place],  [place]  [address] Phone: [phone] Fax: [phone]

## 2020-09-02 NOTE — Evaluation (Signed)
Physical Therapy Evaluation Patient Details Name: Bethany [name] MRN: [id number] DOB: 1945-10-17 Today's Date: 09/02/2020   History of Present Illness  Bethany [name] is a 75 y.o. female with medical history significant for atrial fibrillation, anemia, COPD, type 2 diabetes, hypertension, prior CVA, and GERD who presented to the ED on 7/3 for evaluation of right shoulder pain secondary to mechanical fall.  She was noted to have a displaced right distal radius fracture as well as displaced right distal ulna fracture and right humeral shaft fracture which was to be further evaluated by orthopedics in the outpatient setting.  She was seen today on 7/6 and she was noted to have insufficient reduction of the humeral shaft fracture as well as the distal radius and ulna fractures.  Plan is to perform closed reduction under general anesthesia and/or IV sedation, but given her multiple medical comorbidities she will be admitted to the hospital for further lab work and evaluation prior to the procedure.  Of note, she is legally blind.   Clinical Impression  Patient limited for functional mobility as stated below secondary to BLE weakness, fatigue and impaired balance. Patient educated on NWB RUE. Patient requires mod assist to pull to sitting and upright trunk to transition to seated EOB. Patient demonstrates good sitting balance EOB with unilateral UE support. Patient ends session seated EOB with nursing present. Patient will benefit from continued physical therapy in hospital and recommended venue below to increase strength, balance, endurance for safe ADLs and gait.     Follow Up Recommendations SNF    Equipment Recommendations  None recommended by PT    Recommendations for Other Services       Precautions / Restrictions Precautions Precautions: Fall Restrictions Weight Bearing Restrictions: Yes RUE Weight Bearing: Non weight bearing      Mobility  Bed Mobility Overal bed mobility: Needs  Assistance Bed Mobility: Supine to Sit     Supine to sit: Mod assist;HOB elevated     General bed mobility comments: requires assist to pull to seated and upright trunk with LUE    Transfers                    Ambulation/Gait                Stairs            Wheelchair Mobility    Modified Rankin (Stroke Patients Only)       Balance Overall balance assessment: Needs assistance Sitting-balance support: Single extremity supported;Feet supported Sitting balance-Leahy Scale: Good Sitting balance - Comments: seated EOB                                     Pertinent Vitals/Pain Pain Assessment: No/denies pain    Home Living Family/patient expects to be discharged to:: Private residence Living Arrangements: Alone Available Help at Discharge: Family;Personal care attendant Type of Home: Apartment Home Access: Level entry     Home Layout: One level Home Equipment: Walker - 2 wheels;Cane - single point;Shower seat;Bedside commode      Prior Function Level of Independence: Needs assistance   Gait / Transfers Assistance Needed: patient states household ambulator without AD  ADL's / Homemaking Assistance Needed: aid assists from 9-2 daily        Hand Dominance        Extremity/Trunk Assessment   Upper Extremity Assessment Upper Extremity Assessment: RUE deficits/detail RUE:  Unable to fully assess due to immobilization    Lower Extremity Assessment Lower Extremity Assessment: Generalized weakness    Cervical / Trunk Assessment Cervical / Trunk Assessment: Normal  Communication   Communication: No difficulties  Cognition Arousal/Alertness: Awake/alert Behavior During Therapy: WFL for tasks assessed/performed Overall Cognitive Status: Within Functional Limits for tasks assessed                                        General Comments      Exercises     Assessment/Plan    PT Assessment Patient  needs continued PT services  PT Problem List Decreased strength;Decreased mobility;Decreased activity tolerance;Decreased balance       PT Treatment Interventions DME instruction;Therapeutic exercise;Gait training;Balance training;Stair training;Neuromuscular re-education;Functional mobility training;Therapeutic activities;Patient/family education    PT Goals (Current goals can be found in the Care Plan section)  Acute Rehab PT Goals Patient Stated Goal: Return home PT Goal Formulation: With patient Time For Goal Achievement: 09/16/20 Potential to Achieve Goals: Fair    Frequency Min 3X/week   Barriers to discharge        Co-evaluation               AM-PAC PT "6 Clicks" Mobility  Outcome Measure Help needed turning from your back to your side while in a flat bed without using bedrails?: A Little Help needed moving from lying on your back to sitting on the side of a flat bed without using bedrails?: A Little Help needed moving to and from a bed to a chair (including a wheelchair)?: A Lot Help needed standing up from a chair using your arms (e.g., wheelchair or bedside chair)?: A Lot Help needed to walk in hospital room?: A Lot Help needed climbing 3-5 steps with a railing? : A Lot 6 Click Score: 14    End of Session Equipment Utilized During Treatment:  (R UE in sling) Activity Tolerance: Patient tolerated treatment well Patient left: in bed;with nursing/sitter in room;with call bell/phone within reach Nurse Communication: Mobility status PT Visit Diagnosis: Unsteadiness on feet (R26.81);Other abnormalities of gait and mobility (R26.89);Muscle weakness (generalized) (M62.81)    Time: 4259-5638 PT Time Calculation (min) (ACUTE ONLY): 18 min   Charges:   PT Evaluation $PT Eval Low Complexity: 1 Low PT Treatments $Therapeutic Activity: 8-22 mins       9:59 AM, 09/02/20 Mearl Latin PT, DPT Physical Therapist at [facility]

## 2020-09-02 NOTE — Plan of Care (Signed)
  Problem: Acute Rehab PT Goals(only PT should resolve) Goal: Pt Will Go Supine/Side To Sit Outcome: Progressing Flowsheets (Taken 09/02/2020 1000) Pt will go Supine/Side to Sit: with minimal assist Goal: Pt Will Go Sit To Supine/Side Outcome: Progressing Flowsheets (Taken 09/02/2020 1000) Pt will go Sit to Supine/Side: with minimal assist Goal: Patient Will Transfer Sit To/From Stand Outcome: Progressing Flowsheets (Taken 09/02/2020 1000) Patient will transfer sit to/from stand: with moderate assist Goal: Pt Will Ambulate Outcome: Progressing Flowsheets (Taken 09/02/2020 1000) Pt will Ambulate:  15 feet  with moderate assist  with least restrictive assistive device  10:01 AM, 09/02/20 Mearl Latin PT, DPT Physical Therapist at [facility]

## 2020-09-02 NOTE — TOC Initial Note (Signed)
Transition of Care [facility]) - Initial/Assessment Note    Patient Details  Name: Bethany [name] MRN: [id number] Date of Birth: [birth year]  Transition of Care [facility]) CM/SW Contact:    Natasha Bence, LCSW Phone Number: 09/02/2020, 5:20 PM  Clinical Narrative:                 Patient is a 75 year old female admitted for humeral fracture. CSW conducted initial assessment. Patient's daughter reported that they are agreeable to SNF referral to [facility] and [facility]. CSW faxed out patient and started [place]. TOC to follow.   Expected Discharge Plan: Skilled Nursing Facility Barriers to Discharge: Continued Medical Work up   Patient Goals and CMS Choice Patient states their goals for this hospitalization and ongoing recovery are:: Rehab with SNF CMS Medicare.gov Compare Post Acute Care list provided to:: Patient Choice offered to / list presented to : Patient  Expected Discharge Plan and Services Expected Discharge Plan: [place]       Living arrangements for the past 2 months: Apartment                                      Prior Living Arrangements/Services Living arrangements for the past 2 months: Apartment Lives with:: Self, Adult Children Patient language and need for interpreter reviewed:: Yes Do you feel safe going back to the place where you live?: Yes      Need for Family Participation in Patient Care: Yes (Comment) Care giver support system in place?: Yes (comment)   Criminal Activity/Legal Involvement Pertinent to Current Situation/Hospitalization: No - Comment as needed  Activities of Daily Living Home Assistive Devices/Equipment: Sling (specify type), Other (Comment) (rt arm ace wrap/ sling) ADL Screening (condition at time of admission) Patient's cognitive ability adequate to safely complete daily activities?: Yes Is the patient deaf or have difficulty hearing?: No Does the patient have difficulty seeing, even when wearing glasses/contacts?: No Does the  patient have difficulty concentrating, remembering, or making decisions?: No Patient able to express need for assistance with ADLs?: Yes Does the patient have difficulty dressing or bathing?: Yes Independently performs ADLs?: No Communication: Independent Dressing (OT): Needs assistance Is this a change from baseline?: Pre-admission baseline Grooming: Dependent Is this a change from baseline?: Pre-admission baseline Feeding: Needs assistance Is this a change from baseline?: Pre-admission baseline Bathing: Needs assistance Is this a change from baseline?: Pre-admission baseline Toileting: Needs assistance Is this a change from baseline?: Pre-admission baseline In/Out Bed: Needs assistance Is this a change from baseline?: Pre-admission baseline Walks in Home: Needs assistance Is this a change from baseline?: Pre-admission baseline Does the patient have difficulty walking or climbing stairs?: Yes Weakness of Legs: Both Weakness of Arms/Hands: Right  Permission Sought/Granted Permission sought to share information with : Family Supports Permission granted to share information with : Yes, Verbal Permission Granted  Share Information with NAME: Tinsley,Lucinda   [phone]  Permission granted to share info w AGENCY: [facility]  Permission granted to share info w Relationship: Daughter  Permission granted to share info w Contact Information: [phone]  Emotional Assessment       Orientation: : Oriented to Self, Oriented to Situation, Oriented to Place, Oriented to  Time Alcohol / Substance Use: Not Applicable Psych Involvement: No (comment)  Admission diagnosis:  Humeral fracture [S42.309A] Patient Active Problem List   Diagnosis Date Noted   Humeral fracture 08/31/2020   Epiretinal  membrane (ERM) of right eye 01/03/2020   Moderate nonproliferative diabetic retinopathy of both eyes ([place]) 06/16/2019   Primary open angle glaucoma of both eyes, severe stage 06/16/2019    Dystrophies primarily involving the retinal pigment epithelium 06/16/2019   Retinal hemorrhage of left eye 06/16/2019   Secondary pigmentary degeneration of peripheral retina 06/16/2019   Absolute glaucoma of left eye 06/16/2019   Anemia 01/21/2019   Chronic diastolic congestive heart failure ([place]) 05/12/2018   Tubular adenoma of colon 07/12/2017   Heme positive stool 05/22/2017   Leg swelling-Right Leg > Left leg 03/55/9741   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 ([place]) 12/28/2013   Persistent atrial fibrillation ([place]) 12/28/2013   Glaucoma 12/28/2013   Blind left eye 12/28/2013   PAD (peripheral artery disease) ([place]) 10/21/2013   Anticoagulated on warfarin 03/25/2013   Aortic valve sclerosis 03/25/2013   LVH (left ventricular hypertrophy) 03/25/2013   SOB (shortness of breath) 03/25/2013   Tobacco abuse, in remission 03/25/2013   GERD (gastroesophageal reflux disease) 01/08/2013   History of kidney stones 01/08/2013   COPD (chronic obstructive pulmonary disease) ([place]) 10/07/2012   Essential hypertension 10/07/2012   After cataract not obscuring vision 10/03/2012   PCP:  Glenda Chroman, MD Pharmacy:   [facility], [place] [address] [place] [address] Phone: [phone] Fax: [phone]  [facility] [address], [place] [address] Phone: [phone] Fax: [phone]     Social Determinants of Health (SDOH) Interventions    Readmission Risk Interventions No flowsheet data found.

## 2020-09-02 NOTE — Progress Notes (Signed)
PROGRESS NOTE    Bethany [name]  SAY:[id number] DOB: 03-11-1945 DOA: 08/31/2020 PCP: Glenda Chroman, MD   Brief Narrative:   Per HPI: Bethany [name] is a 75 y.o. female with medical history significant for atrial fibrillation, anemia, COPD, type 2 diabetes, hypertension, prior CVA, and GERD who presented to the ED on 7/3 for evaluation of right shoulder pain secondary to mechanical fall.  She was noted to have a displaced right distal radius fracture as well as displaced right distal ulna fracture and right humeral shaft fracture which was to be further evaluated by orthopedics in the outpatient setting.  She was seen today on 7/6 and she was noted to have insufficient reduction of the humeral shaft fracture as well as the distal radius and ulna fractures.  Plan is to perform closed reduction under general anesthesia and/or IV sedation, but given her multiple medical comorbidities she will be admitted to the hospital for further lab work and evaluation prior to the procedure.  Of note, she is legally blind.  Assessment & Plan:   Active Problems:   Humeral fracture   Right upper extremity fractures -S/P closed reduction per orthopedics under anesthesia on 09/01/20 -PT/OT evaluation completed and recommending SNF -will follow post procedure recommendations by orthopedic service -continue Sling and none weight bearing of movement on RUE -continue PNR analgesics and add PRN robaxin    CKD IV -Monitor strict I's and O's -Continue to follow renal function trend with BMET   Type 2 diabetes -No significant hyperglycemia noted -Carb modified diet ordered -SSI coverage   History of atrial fibrillation with bradycardia -Continue holding Cardizem for now; will follow heart rate on telemetry and decide new adjusted reduced dose for rate control. -Not on anticoagulation as she has prior watchman device -Continue to monitor on telemetry   History of hypertension-controlled -Continue current  antihypertensive agents -Follow vital signs and resume the rest of her home medication regimen as needed.   GERD -Continue Protonix   Iron deficiency anemia-stable -Continue iron supplementation -Monitor CBC intermittently.   Class II obesity -Lifestyle changes as an outpatient -Low calorie diet and portion control discussed with patient. -Body mass index is 35.4 kg/m.    Legally blind -Will require placement on discharge given inability to use her dominant arm -Continue eyedrops and supportive care.    DVT prophylaxis:SCDs Code Status: Full Family Communication: No family at bedside during my evaluation. Disposition Plan:  Status is: Inpatient  Remains inpatient appropriate because: Ongoing diagnostic testing needed not appropriate for outpatient work up and IV treatments appropriate due to intensity of illness or inability to take PO  Dispo: The patient is from: Home              Anticipated d/c is to: SNF              Patient currently reaching medical stability while waiting on bed at SNF for further care and rehab.   Difficult to place patient No   Consultants:  Orthopedics  Procedures:  See below  Antimicrobials:  None   Subjective: Complaining of right arm pain; positive low-grade temperature.  Denies chest pain, no nausea, no vomiting, no shortness of breath.  Objective: Vitals:   09/02/20 0632 09/02/20 0900 09/02/20 1404 09/02/20 1511  BP: (!) 178/71  (!) 166/73 (!) 169/87  Pulse:   (!) 101   Resp:   18   Temp:   (!) 100.4 F (38 C)   TempSrc:   Oral  SpO2:  97% 99%   Weight:      Height:        Intake/Output Summary (Last 24 hours) at 09/02/2020 1643 Last data filed at 09/02/2020 1300 Gross per 24 hour  Intake 300 ml  Output 450 ml  Net -150 ml   Filed Weights   08/31/20 1306  Weight: 102.5 kg    Examination: General exam: Alert, awake, oriented x 3; complaining of pain in her right upper extremity.  Low-grade temperature  appreciated. Respiratory system: Clear to auscultation. Respiratory effort normal.  No requiring oxygen supplementation. Cardiovascular system: Mild sinus tachycardia; No rubs, gallops or JVD. Gastrointestinal system: Abdomen is nondistended, soft and nontender. No organomegaly or masses felt. Normal bowel sounds heard. Central nervous system: Alert and oriented. No new focal neurological deficits. Extremities: No cyanosis or clubbing.  Right arm with a sling and splint in place. Skin: No petechiae. Psychiatry: Judgement and insight appear normal.  No hallucinations.    Data Reviewed: I have personally reviewed following labs and imaging studies  CBC: Recent Labs  Lab 08/28/20 1107 08/31/20 1356 09/01/20 0611 09/02/20 0355  WBC 9.5 7.7 6.3 7.4  NEUTROABS 7.4  --   --   --   HGB 10.6* 9.0* 9.0* 9.8*  HCT 33.5* 28.2* 28.4* 31.3*  MCV 100.9* 102.5* 101.8* 104.0*  PLT 234 175 185 194   Basic Metabolic Panel: Recent Labs  Lab 08/28/20 1107 08/31/20 1356 09/01/20 0611 09/02/20 0355  NA 133* 137 139 141  K 5.9* 4.1 4.8 4.7  CL 105 107 109 107  CO2 22 22 23 26   GLUCOSE 242* 140* 113* 143*  BUN 59* 64* 59* 53*  CREATININE 2.10* 2.29* 2.15* 1.91*  CALCIUM 8.1* 8.6* 8.6* 8.9  MG  --   --  2.1 2.1   GFR: Estimated Creatinine Clearance: 32.3 mL/min (A) (by C-G formula based on SCr of 1.91 mg/dL (H)).  HbA1C: Recent Labs    08/31/20 1356  HGBA1C 6.6*   CBG: Recent Labs  Lab 09/02/20 0039 09/02/20 0502 09/02/20 0722 09/02/20 1106 09/02/20 1614  GLUCAP 165* 142* 162* 179* 158*    Recent Results (from the past 240 hour(s))  Resp Panel by RT-PCR (Flu A&B, Covid) Nasopharyngeal Swab     Status: None   Collection Time: 08/31/20  2:55 PM   Specimen: Nasopharyngeal Swab; Nasopharyngeal(NP) swabs in vial transport medium  Result Value Ref Range Status   SARS Coronavirus 2 by RT PCR NEGATIVE NEGATIVE Final    Comment: (NOTE) SARS-CoV-2 target nucleic acids are NOT  DETECTED.  The SARS-CoV-2 RNA is generally detectable in upper respiratory specimens during the acute phase of infection. The lowest concentration of SARS-CoV-2 viral copies this assay can detect is 138 copies/mL. A negative result does not preclude SARS-Cov-2 infection and should not be used as the sole basis for treatment or other patient management decisions. A negative result may occur with  improper specimen collection/handling, submission of specimen other than nasopharyngeal swab, presence of viral mutation(s) within the areas targeted by this assay, and inadequate number of viral copies(<138 copies/mL). A negative result must be combined with clinical observations, patient history, and epidemiological information. The expected result is Negative.  Fact Sheet for Patients:  EntrepreneurPulse.com.au  Fact Sheet for Healthcare Providers:  IncredibleEmployment.be  This test is no t yet approved or cleared by the [place] FDA and  has been authorized for detection and/or diagnosis of SARS-CoV-2 by FDA under an Emergency Use Authorization (EUA). This EUA will  remain  in effect (meaning this test can be used) for the duration of the COVID-19 declaration under Section 564(b)(1) of the Act, 21 U.S.C.section 360bbb-3(b)(1), unless the authorization is terminated  or revoked sooner.       Influenza A by PCR NEGATIVE NEGATIVE Final   Influenza B by PCR NEGATIVE NEGATIVE Final    Comment: (NOTE) The Xpert Xpress SARS-CoV-2/FLU/RSV plus assay is intended as an aid in the diagnosis of influenza from Nasopharyngeal swab specimens and should not be used as a sole basis for treatment. Nasal washings and aspirates are unacceptable for Xpert Xpress SARS-CoV-2/FLU/RSV testing.  Fact Sheet for Patients: EntrepreneurPulse.com.au  Fact Sheet for Healthcare Providers: IncredibleEmployment.be  This test is not yet  approved or cleared by the [place] FDA and has been authorized for detection and/or diagnosis of SARS-CoV-2 by FDA under an Emergency Use Authorization (EUA). This EUA will remain in effect (meaning this test can be used) for the duration of the COVID-19 declaration under Section 564(b)(1) of the Act, 21 U.S.C. section 360bbb-3(b)(1), unless the authorization is terminated or revoked.  Performed at [facility], [address] [place], [place] [address]          Radiology Studies: DG Wrist 2 Views Right  Result Date: 09/01/2020 CLINICAL DATA:  Close reduction and splinting of the right wrist and humerus. EXAM: DG C-ARM 1-60 MIN; RIGHT HUMERUS - 2+ VIEW; RIGHT WRIST - 2 VIEW FLUOROSCOPY TIME:  Fluoroscopy Time:  39 seconds Number of Acquired Spot Images: 8 COMPARISON:  August 31, 2020. FINDINGS: Eight C-arm fluoroscopic images were obtained intraoperatively and submitted for post operative interpretation. These images demonstrate closed reduction and splinting of the right wrist and humerus. Alignment of the humerus is improved with approximately half shaft with displacement on these images. Alignment at the wrist is also improved with slight radial and dorsal displacement of the distal radial fracture fragment. Slight dorsal displacement of the distal ulnar fracture as well. Please see prior radiographs for further characterization of these fractures. Please see the performing provider's procedural report for further detail. IMPRESSION: Intraoperative fluoroscopy, as detailed above Electronically Signed   By: Margaretha Sheffield MD   On: 09/01/2020 12:50   DG Humerus Right  Result Date: 09/01/2020 CLINICAL DATA:  Close reduction and splinting of the right wrist and humerus. EXAM: DG C-ARM 1-60 MIN; RIGHT HUMERUS - 2+ VIEW; RIGHT WRIST - 2 VIEW FLUOROSCOPY TIME:  Fluoroscopy Time:  39 seconds Number of Acquired Spot Images: 8 COMPARISON:  August 31, 2020. FINDINGS: Eight C-arm fluoroscopic images  were obtained intraoperatively and submitted for post operative interpretation. These images demonstrate closed reduction and splinting of the right wrist and humerus. Alignment of the humerus is improved with approximately half shaft with displacement on these images. Alignment at the wrist is also improved with slight radial and dorsal displacement of the distal radial fracture fragment. Slight dorsal displacement of the distal ulnar fracture as well. Please see prior radiographs for further characterization of these fractures. Please see the performing provider's procedural report for further detail. IMPRESSION: Intraoperative fluoroscopy, as detailed above Electronically Signed   By: Margaretha Sheffield MD   On: 09/01/2020 12:50   DG C-Arm 1-60 Min  Result Date: 09/01/2020 CLINICAL DATA:  Close reduction and splinting of the right wrist and humerus. EXAM: DG C-ARM 1-60 MIN; RIGHT HUMERUS - 2+ VIEW; RIGHT WRIST - 2 VIEW FLUOROSCOPY TIME:  Fluoroscopy Time:  39 seconds Number of Acquired Spot Images: 8 COMPARISON:  August 31, 2020. FINDINGS: Eight C-arm fluoroscopic images were obtained intraoperatively and submitted for post operative interpretation. These images demonstrate closed reduction and splinting of the right wrist and humerus. Alignment of the humerus is improved with approximately half shaft with displacement on these images. Alignment at the wrist is also improved with slight radial and dorsal displacement of the distal radial fracture fragment. Slight dorsal displacement of the distal ulnar fracture as well. Please see prior radiographs for further characterization of these fractures. Please see the performing provider's procedural report for further detail. IMPRESSION: Intraoperative fluoroscopy, as detailed above Electronically Signed   By: Margaretha Sheffield MD   On: 09/01/2020 12:50   DG C-Arm 1-60 Min  Result Date: 09/01/2020 CLINICAL DATA:  Close reduction and splinting of the right wrist and  humerus. EXAM: DG C-ARM 1-60 MIN; RIGHT HUMERUS - 2+ VIEW; RIGHT WRIST - 2 VIEW FLUOROSCOPY TIME:  Fluoroscopy Time:  39 seconds Number of Acquired Spot Images: 8 COMPARISON:  August 31, 2020. FINDINGS: Eight C-arm fluoroscopic images were obtained intraoperatively and submitted for post operative interpretation. These images demonstrate closed reduction and splinting of the right wrist and humerus. Alignment of the humerus is improved with approximately half shaft with displacement on these images. Alignment at the wrist is also improved with slight radial and dorsal displacement of the distal radial fracture fragment. Slight dorsal displacement of the distal ulnar fracture as well. Please see prior radiographs for further characterization of these fractures. Please see the performing provider's procedural report for further detail. IMPRESSION: Intraoperative fluoroscopy, as detailed above Electronically Signed   By: Margaretha Sheffield MD   On: 09/01/2020 12:50     Scheduled Meds:  acetaminophen  1,000 mg Oral Once   amLODipine  2.5 mg Oral Daily   aspirin EC  81 mg Oral Daily   atorvastatin  10 mg Oral q1800   B-complex with vitamin C  1 tablet Oral Daily   brimonidine  1 drop Right Eye BID   cloNIDine  0.1 mg Oral TID   dorzolamide-timolol  1 drop Right Eye BID   ferrous gluconate  325 mg Oral Daily   heparin injection (subcutaneous)  5,000 Units Subcutaneous Q8H   hydrALAZINE  50 mg Oral TID   insulin aspart  0-15 Units Subcutaneous Q4H   latanoprost  1 drop Right Eye QHS   loratadine  10 mg Oral Daily   pantoprazole  40 mg Oral Daily     LOS: 2 days    Time spent: 35 minutes   Barton Dubois, MD [facility]  If 7PM-7AM, please contact night-coverage [email or web address] 09/02/2020, 4:43 PM

## 2020-09-02 NOTE — Care Management Important Message (Signed)
Important Message  Patient Details  Name: SLOKA VOLANTE MRN: [id number] Date of Birth: [birth year]   Medicare Important Message Given:  Yes     Tommy Medal 09/02/2020, 1:37 PM

## 2020-09-02 NOTE — [place] FL2 (Signed)
[place] MEDICAID FL2 LEVEL OF CARE SCREENING TOOL     IDENTIFICATION  Patient Name: Bethany [name] Birthdate: 06-Apr-1945 Sex: female Admission Date (Current Location): 08/31/2020  [facility] and [place] Number:  [employer] and Address:  [place] [address], [place]      Provider Number: [id number]  Attending Physician Name and Address:  Barton Dubois, MD  Relative Name and Phone Number:  Lowella Curb (Daughter)   [phone]    Current Level of Care: Hospital Recommended Level of Care: [place] Prior Approval Number:    Date Approved/Denied: 09/02/20 PASRR Number: [id number]  Discharge Plan: SNF    Current Diagnoses: Patient Active Problem List   Diagnosis Date Noted   Humeral fracture 08/31/2020   Epiretinal membrane (ERM) of right eye 01/03/2020   Moderate nonproliferative diabetic retinopathy of both eyes ([place]) 06/16/2019   Primary open angle glaucoma of both eyes, severe stage 06/16/2019   Dystrophies primarily involving the retinal pigment epithelium 06/16/2019   Retinal hemorrhage of left eye 06/16/2019   Secondary pigmentary degeneration of peripheral retina 06/16/2019   Absolute glaucoma of left eye 06/16/2019   Anemia 01/21/2019   Chronic diastolic congestive heart failure ([place]) 05/12/2018   Tubular adenoma of colon 07/12/2017   Heme positive stool 05/22/2017   Leg swelling-Right Leg > Left leg 18/84/1660   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 ([place]) 12/28/2013   Persistent atrial fibrillation ([place]) 12/28/2013   Glaucoma 12/28/2013   Blind left eye 12/28/2013   PAD (peripheral artery disease) ([place]) 10/21/2013   Anticoagulated on warfarin 03/25/2013   Aortic valve sclerosis 03/25/2013   LVH (left ventricular hypertrophy) 03/25/2013   SOB (shortness of breath) 03/25/2013   Tobacco abuse, in remission 03/25/2013   GERD (gastroesophageal reflux disease) 01/08/2013   History of  kidney stones 01/08/2013   COPD (chronic obstructive pulmonary disease) ([place]) 10/07/2012   Essential hypertension 10/07/2012   After cataract not obscuring vision 10/03/2012    Orientation RESPIRATION BLADDER Height & Weight     Self, Time, Situation, Place  O2 (2.5) Incontinent Weight: 226 lb (102.5 kg) Height:  5\' 7"  (170.2 cm)  BEHAVIORAL SYMPTOMS/MOOD NEUROLOGICAL BOWEL NUTRITION STATUS      Continent Diet (Diet heart healthy/carb modified Room service appropriate? Yes; Fluid consistency: Thin)  AMBULATORY STATUS COMMUNICATION OF NEEDS Skin   Extensive Assist Verbally Surgical wounds (Right arm)                       Personal Care Assistance Level of Assistance  Bathing, Feeding, Dressing Bathing Assistance: Maximum assistance Feeding assistance: Independent Dressing Assistance: Maximum assistance     Functional Limitations Info  Sight, Hearing, Speech Sight Info: Impaired Hearing Info: Adequate Speech Info: Adequate    SPECIAL CARE FACTORS FREQUENCY  PT (By licensed PT), OT (By licensed OT)     PT Frequency: 5x OT Frequency: 5x            Contractures Contractures Info: Not present    Additional Factors Info  Code Status, Allergies, Insulin Sliding Scale, Psychotropic Code Status Info: Full Allergies Info: Sulfa Antibiotics, Sulfamethoxazole, Tape Psychotropic Info: oxyCODONE-acetaminophen Insulin Sliding Scale Info: CBG < 70: Implement Hypoglycemia Standing Orders and refer to Hypoglycemia Standing Orders sidebar report   CBG 70 - 120: 0 units   CBG 121 - 150: 2 units   CBG 151 - 200: 3 units   CBG 201 - 250: 5 units   CBG 251 -  300: 8 units   CBG 301 - 350: 11 units   CBG 351 - 400: 15 units   CBG > 400 call MD and obtain STAT lab verification       Current Medications (09/02/2020):  This is the current hospital active medication list Current Facility-Administered Medications  Medication Dose Route Frequency Provider Last Rate Last Admin    acetaminophen (TYLENOL) tablet 650 mg  650 mg Oral Q6H PRN Mordecai Rasmussen, MD   650 mg at 09/02/20 1514   Or   acetaminophen (TYLENOL) suppository 650 mg  650 mg Rectal Q6H PRN Mordecai Rasmussen, MD       acetaminophen (TYLENOL) tablet 1,000 mg  1,000 mg Oral Once Barton Dubois, MD       albuterol (PROVENTIL) (2.5 MG/3ML) 0.083% nebulizer solution 3 mL  3 mL Inhalation Q6H PRN Mordecai Rasmussen, MD   3 mL at 09/01/20 1244   amLODipine (NORVASC) tablet 2.5 mg  2.5 mg Oral Daily Mordecai Rasmussen, MD   2.5 mg at 09/02/20 1040   aspirin EC tablet 81 mg  81 mg Oral Daily Mordecai Rasmussen, MD   81 mg at 09/02/20 1041   atorvastatin (LIPITOR) tablet 10 mg  10 mg Oral q1800 Mordecai Rasmussen, MD   10 mg at 09/01/20 1758   B-complex with vitamin C tablet 1 tablet  1 tablet Oral Daily Mordecai Rasmussen, MD   1 tablet at 09/02/20 1041   brimonidine (ALPHAGAN) 0.2 % ophthalmic solution 1 drop  1 drop Right Eye BID Mordecai Rasmussen, MD   1 drop at 09/02/20 1041   cloNIDine (CATAPRES) tablet 0.1 mg  0.1 mg Oral TID Mordecai Rasmussen, MD   0.1 mg at 09/02/20 1511   dorzolamide-timolol (COSOPT) 22.3-6.8 MG/ML ophthalmic solution 1 drop  1 drop Right Eye BID Mordecai Rasmussen, MD   1 drop at 09/02/20 1041   ferrous gluconate (FERGON) tablet 325 mg  325 mg Oral Daily Mordecai Rasmussen, MD   325 mg at 09/02/20 1041   heparin injection 5,000 Units  5,000 Units Subcutaneous Q8H Barton Dubois, MD   5,000 Units at 09/02/20 1048   hydrALAZINE (APRESOLINE) injection 10 mg  10 mg Intravenous Q4H PRN Mordecai Rasmussen, MD   10 mg at 09/02/20 0544   hydrALAZINE (APRESOLINE) tablet 50 mg  50 mg Oral TID Mordecai Rasmussen, MD   50 mg at 09/02/20 1514   HYDROmorphone (DILAUDID) injection 0.5-1 mg  0.5-1 mg Intravenous Q2H PRN Mordecai Rasmussen, MD   1 mg at 09/02/20 1514   insulin aspart (novoLOG) injection 0-15 Units  0-15 Units Subcutaneous Q4H Mordecai Rasmussen, MD   3 Units at 09/02/20 1310   latanoprost (XALATAN) 0.005 % ophthalmic solution 1 drop  1 drop  Right Eye QHS Larena Glassman A, MD   1 drop at 09/01/20 2234   loratadine (CLARITIN) tablet 10 mg  10 mg Oral Daily Mordecai Rasmussen, MD   10 mg at 09/02/20 1042   methocarbamol (ROBAXIN) 500 mg in dextrose 5 % 50 mL IVPB  500 mg Intravenous Q8H PRN Barton Dubois, MD       ondansetron [facility]) tablet 4 mg  4 mg Oral Q6H PRN Mordecai Rasmussen, MD       Or   ondansetron [facility]) injection 4 mg  4 mg Intravenous Q6H PRN Mordecai Rasmussen, MD   4 mg at 09/02/20 1515   pantoprazole (PROTONIX) EC tablet  40 mg  40 mg Oral Daily Mordecai Rasmussen, MD   40 mg at 09/02/20 1042     Discharge Medications: Please see discharge summary for a list of discharge medications.  Relevant Imaging Results:  Relevant Lab Results:   Additional Information Pt SSN [id number]  Natasha Bence, LCSW

## 2020-09-03 ENCOUNTER — Inpatient Hospital Stay (HOSPITAL_COMMUNITY): Payer: Medicare Other

## 2020-09-03 LAB — BASIC METABOLIC PANEL
Anion gap: 7 (ref 5–15)
BUN: 53 mg/dL — ABNORMAL HIGH (ref 8–23)
CO2: 28 mmol/L (ref 22–32)
Calcium: 8.9 mg/dL (ref 8.9–10.3)
Chloride: 107 mmol/L (ref 98–111)
Creatinine, Ser: 1.74 mg/dL — ABNORMAL HIGH (ref 0.44–1.00)
GFR, Estimated: 31 mL/min — ABNORMAL LOW (ref >60)
Glucose, Bld: 149 mg/dL — ABNORMAL HIGH (ref 70–99)
Potassium: 4.6 mmol/L (ref 3.5–5.1)
Sodium: 142 mmol/L (ref 135–145)

## 2020-09-03 LAB — CBC
HCT: 30.5 % — ABNORMAL LOW (ref 36.0–46.0)
Hemoglobin: 9.5 g/dL — ABNORMAL LOW (ref 12.0–15.0)
MCH: 32.2 pg (ref 26.0–34.0)
MCHC: 31.1 g/dL (ref 30.0–36.0)
MCV: 103.4 fL — ABNORMAL HIGH (ref 80.0–100.0)
Platelets: 203 10*3/uL (ref 150–400)
RBC: 2.95 MIL/uL — ABNORMAL LOW (ref 3.87–5.11)
RDW: 13 % (ref 11.5–15.5)
WBC: 8.9 10*3/uL (ref 4.0–10.5)
nRBC: 0 % (ref 0.0–0.2)

## 2020-09-03 LAB — GLUCOSE, CAPILLARY
Glucose-Capillary: 133 mg/dL — ABNORMAL HIGH (ref 70–99)
Glucose-Capillary: 146 mg/dL — ABNORMAL HIGH (ref 70–99)
Glucose-Capillary: 147 mg/dL — ABNORMAL HIGH (ref 70–99)
Glucose-Capillary: 151 mg/dL — ABNORMAL HIGH (ref 70–99)
Glucose-Capillary: 160 mg/dL — ABNORMAL HIGH (ref 70–99)
Glucose-Capillary: 161 mg/dL — ABNORMAL HIGH (ref 70–99)
Glucose-Capillary: 166 mg/dL — ABNORMAL HIGH (ref 70–99)

## 2020-09-03 MED ORDER — METOCLOPRAMIDE HCL 5 MG/ML IJ SOLN
10.0000 mg | Freq: Three times a day (TID) | INTRAMUSCULAR | Status: DC | PRN
  Administered 2020-09-03: 10 mg via INTRAVENOUS
  Filled 2020-09-03: qty 2

## 2020-09-03 MED ORDER — POLYETHYLENE GLYCOL 3350 17 G PO PACK
17.0000 g | PACK | Freq: Every day | ORAL | Status: DC
  Administered 2020-09-03 – 2020-09-05 (×3): 17 g via ORAL
  Filled 2020-09-03 (×3): qty 1

## 2020-09-03 MED ORDER — DOCUSATE SODIUM 100 MG PO CAPS
100.0000 mg | ORAL_CAPSULE | Freq: Two times a day (BID) | ORAL | Status: DC
  Administered 2020-09-03 – 2020-09-05 (×5): 100 mg via ORAL
  Filled 2020-09-03 (×5): qty 1

## 2020-09-03 NOTE — Progress Notes (Signed)
   ORTHOPAEDIC PROGRESS NOTE  S/P Procedure(s): CLOSED REDUCTION right distal radius and right humerus shaft  DOS: 09/01/20  SUBJECTIVE: Continues to have nausea and vomiting.  She is not passing gas.  No BM in several days.  Arm is comfortable.  She denies pain in the axilla from her splint.  No numbness or tingling.   OBJECTIVE: PE:  Resting comfortably.  No acute distress.  Small amount of vomit on towel on chest.   Breathing comfortably, [place] in place  RUE in a splint, clean, dry and intact. Fingers are warm and well perfused.   Flexion and extension intact to exposed fingers. Sensation intact distally.  Sling fitting well.  Splint not digging into her axilla  Vitals:   09/03/20 0006 09/03/20 0419  BP: (!) 146/72 (!) 153/81  Pulse: 67 97  Resp: 19 19  Temp: 99.3 F (37.4 C) 99 F (37.2 C)  SpO2: 99% 100%     ASSESSMENT: Bethany [name] is a 75 y.o. female stable following anesthesia for closed splinting; nausea and vomiting, possible ileus vs constipation from narcotics.   PLAN: Weightbearing: NWB RUE Insicional and dressing care: Splint left intact until follow-up Orthopedic device(s): Splint VTE prophylaxis: At discretion of primary team Pain control: As needed, prefer PO medications Follow - up plan: 2 weeks after closed reduction    Contact information:     Jola Critzer A. Amedeo Kinsman, MD [place] [address] [place],  [place]  [address] Phone: [phone] Fax: [phone]

## 2020-09-03 NOTE — Progress Notes (Signed)
PROGRESS NOTE    Bethany [name]  YKZ:[id number] DOB: 1945/08/01 DOA: 08/31/2020 PCP: Glenda Chroman, MD   Brief Narrative:   Per HPI: Bethany [name] is a 75 y.o. female with medical history significant for atrial fibrillation, anemia, COPD, type 2 diabetes, hypertension, prior CVA, and GERD who presented to the ED on 7/3 for evaluation of right shoulder pain secondary to mechanical fall.  She was noted to have a displaced right distal radius fracture as well as displaced right distal ulna fracture and right humeral shaft fracture which was to be further evaluated by orthopedics in the outpatient setting.  She was seen today on 7/6 and she was noted to have insufficient reduction of the humeral shaft fracture as well as the distal radius and ulna fractures.  Plan is to perform closed reduction under general anesthesia and/or IV sedation, but given her multiple medical comorbidities she will be admitted to the hospital for further lab work and evaluation prior to the procedure.  Of note, she is legally blind.  Assessment & Plan:   Active Problems:   Humeral fracture   Right upper extremity fractures -S/P closed reduction per orthopedics under anesthesia on 09/01/20 -PT/OT evaluation completed and recommending SNF -will follow post procedure recommendations by orthopedic service -continue Sling and none weight bearing of movement on RUE -continue PNR analgesics and add PRN robaxin    CKD IV -Monitor strict I's and O's -Continue to follow renal function trend with BMET   Type 2 diabetes -No significant hyperglycemia noted -Carb modified diet ordered -SSI coverage   History of atrial fibrillation with bradycardia -Continue holding Cardizem for now; will follow heart rate on telemetry and decide new adjusted reduced dose for rate control. -Not on anticoagulation as she has prior watchman device -Continue to monitor on telemetry   History of hypertension-controlled -Continue current  antihypertensive agents -Follow vital signs and resume the rest of her home medication regimen as needed.   GERD -Continue Protonix   Iron deficiency anemia-stable -Continue iron supplementation -Monitor CBC intermittently.   Class II obesity -Lifestyle changes as an outpatient -Low calorie diet and portion control discussed with patient. -Body mass index is 35.4 kg/m.    Legally blind -Will require placement on discharge given inability to use her dominant arm -Continue eyedrops and supportive care.  Nausea/vomiting/no BM -Findings concerning for ileus in the setting of narcotics usage -Will check abdominal x-ray 2 views -As needed Reglan will be added for refractory symptoms -Starting MiraLAX and Colace.    DVT prophylaxis:SCDs Code Status: Full Family Communication: No family at bedside during my evaluation. Disposition Plan:  Status is: Inpatient  Remains inpatient appropriate because: Ongoing diagnostic testing needed not appropriate for outpatient work up and IV treatments appropriate due to intensity of illness or inability to take PO  Dispo: The patient is from: Home              Anticipated d/c is to: SNF              Patient currently no medically stable; complaining of nausea/vomiting and no bowel movement.  Concerning for ileus.   Difficult to place patient No   Consultants:  Orthopedics  Procedures:  See below  Antimicrobials:  None   Subjective:  Afebrile, no chest pain, reports slight improvement in her right upper extremity pain.  Complaining of intermittent nausea/vomiting along with no bowel movements and no passing gas.  Objective: Vitals:   09/02/20 1511 09/02/20 2030 09/03/20 0006 09/03/20  0419  BP: (!) 169/87 (!) 183/82 (!) 146/72 (!) 153/81  Pulse:  98 67 97  Resp:  20 19 19   Temp:  98.9 F (37.2 C) 99.3 F (37.4 C) 99 F (37.2 C)  TempSrc:  Oral Oral Oral  SpO2:  98% 99% 100%  Weight:      Height:        Intake/Output  Summary (Last 24 hours) at 09/03/2020 1248 Last data filed at 09/03/2020 0900 Gross per 24 hour  Intake 360 ml  Output 1300 ml  Net -940 ml   Filed Weights   08/31/20 1306  Weight: 102.5 kg    Examination: General exam: Alert, awake, oriented x 3; reports no chest pain, no fever.  Expressed right upper extremity pain is a slightly better control.  Intermittent ongoing episodes of nausea/vomiting, decreased oral intake and no bowel movements or farting. Respiratory system: Clear to auscultation. Respiratory effort normal. Cardiovascular system: Rate controlled, no rubs, no gallops, no JVD on exam.   Gastrointestinal system: Abdomen is nondistended; decreased bowel sounds appreciated.  No pain on palpation.   Central nervous system: No new focal deficits appreciated. Extremities: No cyanosis or clubbing; right arm with sling and splint in place. Skin: No petechiae. Psychiatry: Judgement and insight appear normal. Mood & affect appropriate.   Data Reviewed: I have personally reviewed following labs and imaging studies  CBC: Recent Labs  Lab 08/28/20 1107 08/31/20 1356 09/01/20 0611 09/02/20 0355 09/03/20 0419  WBC 9.5 7.7 6.3 7.4 8.9  NEUTROABS 7.4  --   --   --   --   HGB 10.6* 9.0* 9.0* 9.8* 9.5*  HCT 33.5* 28.2* 28.4* 31.3* 30.5*  MCV 100.9* 102.5* 101.8* 104.0* 103.4*  PLT 234 175 185 204 696   Basic Metabolic Panel: Recent Labs  Lab 08/28/20 1107 08/31/20 1356 09/01/20 0611 09/02/20 0355 09/03/20 0419  NA 133* 137 139 141 142  K 5.9* 4.1 4.8 4.7 4.6  CL 105 107 109 107 107  CO2 22 22 23 26 28   GLUCOSE 242* 140* 113* 143* 149*  BUN 59* 64* 59* 53* 53*  CREATININE 2.10* 2.29* 2.15* 1.91* 1.74*  CALCIUM 8.1* 8.6* 8.6* 8.9 8.9  MG  --   --  2.1 2.1  --    GFR: Estimated Creatinine Clearance: 35.5 mL/min (A) (by C-G formula based on SCr of 1.74 mg/dL (H)).  HbA1C: Recent Labs    08/31/20 1356  HGBA1C 6.6*   CBG: Recent Labs  Lab 09/02/20 2203  09/03/20 0001 09/03/20 0415 09/03/20 0739 09/03/20 1127  GLUCAP 168* 160* 146* 147* 151*    Recent Results (from the past 240 hour(s))  Resp Panel by RT-PCR (Flu A&B, Covid) Nasopharyngeal Swab     Status: None   Collection Time: 08/31/20  2:55 PM   Specimen: Nasopharyngeal Swab; Nasopharyngeal(NP) swabs in vial transport medium  Result Value Ref Range Status   SARS Coronavirus 2 by RT PCR NEGATIVE NEGATIVE Final    Comment: (NOTE) SARS-CoV-2 target nucleic acids are NOT DETECTED.  The SARS-CoV-2 RNA is generally detectable in upper respiratory specimens during the acute phase of infection. The lowest concentration of SARS-CoV-2 viral copies this assay can detect is 138 copies/mL. A negative result does not preclude SARS-Cov-2 infection and should not be used as the sole basis for treatment or other patient management decisions. A negative result may occur with  improper specimen collection/handling, submission of specimen other than nasopharyngeal swab, presence of viral mutation(s) within the areas targeted  by this assay, and inadequate number of viral copies(<138 copies/mL). A negative result must be combined with clinical observations, patient history, and epidemiological information. The expected result is Negative.  Fact Sheet for Patients:  EntrepreneurPulse.com.au  Fact Sheet for Healthcare Providers:  IncredibleEmployment.be  This test is no t yet approved or cleared by the [place] FDA and  has been authorized for detection and/or diagnosis of SARS-CoV-2 by FDA under an Emergency Use Authorization (EUA). This EUA will remain  in effect (meaning this test can be used) for the duration of the COVID-19 declaration under Section 564(b)(1) of the Act, 21 U.S.C.section 360bbb-3(b)(1), unless the authorization is terminated  or revoked sooner.       Influenza A by PCR NEGATIVE NEGATIVE Final   Influenza B by PCR NEGATIVE  NEGATIVE Final    Comment: (NOTE) The Xpert Xpress SARS-CoV-2/FLU/RSV plus assay is intended as an aid in the diagnosis of influenza from Nasopharyngeal swab specimens and should not be used as a sole basis for treatment. Nasal washings and aspirates are unacceptable for Xpert Xpress SARS-CoV-2/FLU/RSV testing.  Fact Sheet for Patients: EntrepreneurPulse.com.au  Fact Sheet for Healthcare Providers: IncredibleEmployment.be  This test is not yet approved or cleared by the [place] FDA and has been authorized for detection and/or diagnosis of SARS-CoV-2 by FDA under an Emergency Use Authorization (EUA). This EUA will remain in effect (meaning this test can be used) for the duration of the COVID-19 declaration under Section 564(b)(1) of the Act, 21 U.S.C. section 360bbb-3(b)(1), unless the authorization is terminated or revoked.  Performed at [facility], [address], [place], [place] [address]       Radiology Studies: No results found.   Scheduled Meds:  amLODipine  2.5 mg Oral Daily   aspirin EC  81 mg Oral Daily   atorvastatin  10 mg Oral q1800   B-complex with vitamin C  1 tablet Oral Daily   brimonidine  1 drop Right Eye BID   cloNIDine  0.1 mg Oral TID   docusate sodium  100 mg Oral BID   dorzolamide-timolol  1 drop Right Eye BID   ferrous gluconate  325 mg Oral Daily   heparin injection (subcutaneous)  5,000 Units Subcutaneous Q8H   hydrALAZINE  50 mg Oral TID   insulin aspart  0-15 Units Subcutaneous Q4H   latanoprost  1 drop Right Eye QHS   loratadine  10 mg Oral Daily   pantoprazole  40 mg Oral Daily   polyethylene glycol  17 g Oral Daily     LOS: 3 days    Time spent: 35 minutes   Barton Dubois, MD [facility]  If 7PM-7AM, please contact night-coverage [email or web address] 09/03/2020, 12:48 PM

## 2020-09-03 NOTE — TOC Progression Note (Signed)
Transition of Care [facility]) - Progression Note    Patient Details  Name: Bethany [name] MRN: [id number] Date of Birth: Aug 24, 1945  [facility] CM/SW Contact  Natasha Bence, LCSW Phone Number: 09/03/2020, 4:26 PM  Clinical Narrative:    Patient's daughter reported that patient is fully vaccinated with 2 boosters. Ebony Hail with [facility] made bed offer, but reported that they have a Covid outbreak. Patient's daughter reported that she preferred not to admit to [facility] and to wait until admissions with [facility] is able to review patient Monday. MD notified CSW that patient is not medically stable for discharge. CSW notified patient's daughter that further discussion can be had about placement and bed availability when patient is medically stable for d/c. TOC to follow.    Expected Discharge Plan: [place] Barriers to Discharge: Continued Medical Work up  Expected Discharge Plan and Services Expected Discharge Plan: [place] arrangements for the past 2 months: Apartment                                       Social Determinants of Health (SDOH) Interventions    Readmission Risk Interventions No flowsheet data found.

## 2020-09-04 LAB — GLUCOSE, CAPILLARY
Glucose-Capillary: 126 mg/dL — ABNORMAL HIGH (ref 70–99)
Glucose-Capillary: 127 mg/dL — ABNORMAL HIGH (ref 70–99)
Glucose-Capillary: 135 mg/dL — ABNORMAL HIGH (ref 70–99)
Glucose-Capillary: 151 mg/dL — ABNORMAL HIGH (ref 70–99)
Glucose-Capillary: 123 mg/dL — ABNORMAL HIGH (ref 70–99)

## 2020-09-04 MED ORDER — FERROUS GLUCONATE 324 (38 FE) MG PO TABS
324.0000 mg | ORAL_TABLET | Freq: Every day | ORAL | Status: DC
  Administered 2020-09-04 – 2020-09-05 (×2): 324 mg via ORAL
  Filled 2020-09-04: qty 1

## 2020-09-04 MED ORDER — BISACODYL 10 MG RE SUPP
10.0000 mg | Freq: Every day | RECTAL | Status: DC | PRN
  Administered 2020-09-05: 10 mg via RECTAL
  Filled 2020-09-04: qty 1

## 2020-09-04 MED ORDER — SIMETHICONE 80 MG PO CHEW
40.0000 mg | CHEWABLE_TABLET | Freq: Four times a day (QID) | ORAL | Status: DC
  Administered 2020-09-04 – 2020-09-05 (×6): 40 mg via ORAL
  Filled 2020-09-04 (×6): qty 1

## 2020-09-04 NOTE — Progress Notes (Signed)
PROGRESS NOTE    Bethany [name]  MLY:[id number] DOB: 04-04-1945 DOA: 08/31/2020 PCP: Glenda Chroman, MD   Brief Narrative:   Per HPI: Bethany [name] is a 75 y.o. female with medical history significant for atrial fibrillation, anemia, COPD, type 2 diabetes, hypertension, prior CVA, and GERD who presented to the ED on 7/3 for evaluation of right shoulder pain secondary to mechanical fall.  She was noted to have a displaced right distal radius fracture as well as displaced right distal ulna fracture and right humeral shaft fracture which was to be further evaluated by orthopedics in the outpatient setting.  She was seen today on 7/6 and she was noted to have insufficient reduction of the humeral shaft fracture as well as the distal radius and ulna fractures.  Plan is to perform closed reduction under general anesthesia and/or IV sedation, but given her multiple medical comorbidities she will be admitted to the hospital for further lab work and evaluation prior to the procedure.  Of note, she is legally blind.  Assessment & Plan:   Active Problems:   Humeral fracture   Right upper extremity fractures -S/P closed reduction per orthopedics under anesthesia on 09/01/20 -PT/OT evaluation completed and recommending SNF -will follow post procedure recommendations by orthopedic service -continue Sling and none weight bearing of movement on RUE -continue PNR analgesics and add PRN robaxin    CKD IV -Monitor strict I's and O's -Continue to follow renal function trend with BMET   Type 2 diabetes -No significant hyperglycemia noted -Carb modified diet ordered -SSI coverage   History of atrial fibrillation with bradycardia -Continue holding Cardizem for now; will follow heart rate on telemetry and decide new adjusted reduced dose for rate control. -Not on anticoagulation as she has prior watchman device -Continue to monitor on telemetry   History of hypertension-controlled -Continue current  antihypertensive agents -Follow vital signs and resume the rest of her home medication regimen as needed.   GERD -Continue Protonix   Iron deficiency anemia-stable -Continue iron supplementation -Monitor CBC intermittently.   Class II obesity -Lifestyle changes as an outpatient -Low calorie diet and portion control discussed with patient. -Body mass index is 35.4 kg/m.    Legally blind -Will require placement on discharge given inability to use her dominant arm -Continue eyedrops and supportive care.  Nausea/vomiting/no BM -Findings on x-ray demonstrating stool burden and constipation. -Continue as needed Reglan will be added for refractory symptoms and also MiraLAX and Colace. -Dulcolax suppository added and increase physical activity discussed with patient.  DVT prophylaxis:SCDs Code Status: Full Family Communication: Daughter at bedside. Disposition Plan:  Status is: Inpatient  Remains inpatient appropriate because: Ongoing diagnostic testing needed not appropriate for outpatient work up and IV treatments appropriate due to intensity of illness or inability to take PO  Dispo: The patient is from: Home              Anticipated d/c is to: SNF              Patient currently no medically stable; complaining of no bowel movement and decreased oral intake.  Positive for ileus/constipation appreciated on x-ray.  Hopefully ready for discharge 09/05/2020.   Difficult to place patient No   Consultants:  Orthopedics  Procedures:  See below  Antimicrobials:  None   Subjective:  No fever, no chest pain, reports no nausea or vomiting today.  Patient expressed passing gas but no bowel movement.  Still having intermittent pain in her right upper extremity.  Objective: Vitals:   09/03/20 1318 09/03/20 1959 09/04/20 0505 09/04/20 1330  BP: (!) 161/68 (!) 175/59 (!) 127/50 (!) 114/59  Pulse: 84 84 (!) 103 77  Resp: 17  19 16   Temp: 99.3 F (37.4 C) 98.1 F (36.7 C) 99.1 F  (37.3 C) 98.6 F (37 C)  TempSrc: Oral Oral Oral Oral  SpO2: 98% 94% 99% 97%  Weight:      Height:        Intake/Output Summary (Last 24 hours) at 09/04/2020 1410 Last data filed at 09/04/2020 0859 Gross per 24 hour  Intake 1200 ml  Output 1100 ml  Net 100 ml   Filed Weights   08/31/20 1306  Weight: 102.5 kg    Examination: General exam: Alert, awake, oriented x 3; no chest pain, no fever, no further episode of nausea vomiting since 09/03/2020.  Patient reports no abdominal pain.  No bowel movement. Respiratory system: Clear to auscultation. Respiratory effort normal.  2 L nasal cannula in place (oxygen saturation 97-99%). Cardiovascular system:RRR. No rubs or gallops; no JVD. Gastrointestinal system: Abdomen is nondistended, soft and nontender. No organomegaly or masses felt. Normal bowel sounds heard. Central nervous system: Alert and oriented. No new focal neurological deficits. Extremities: No cyanosis or clubbing; reporting pain in her right upper extremity; sling in place. Skin: No petechiae. Psychiatry: Judgement and insight appear normal. Mood & affect appropriate.    Data Reviewed: I have personally reviewed following labs and imaging studies  CBC: Recent Labs  Lab 08/31/20 1356 09/01/20 0611 09/02/20 0355 09/03/20 0419  WBC 7.7 6.3 7.4 8.9  HGB 9.0* 9.0* 9.8* 9.5*  HCT 28.2* 28.4* 31.3* 30.5*  MCV 102.5* 101.8* 104.0* 103.4*  PLT 175 185 204 269   Basic Metabolic Panel: Recent Labs  Lab 08/31/20 1356 09/01/20 0611 09/02/20 0355 09/03/20 0419  NA 137 139 141 142  K 4.1 4.8 4.7 4.6  CL 107 109 107 107  CO2 22 23 26 28   GLUCOSE 140* 113* 143* 149*  BUN 64* 59* 53* 53*  CREATININE 2.29* 2.15* 1.91* 1.74*  CALCIUM 8.6* 8.6* 8.9 8.9  MG  --  2.1 2.1  --    GFR: Estimated Creatinine Clearance: 35.5 mL/min (A) (by C-G formula based on SCr of 1.74 mg/dL (H)).  HbA1C: No results for input(s): HGBA1C in the last 72 hours.  CBG: Recent Labs  Lab  09/03/20 1958 09/03/20 2326 09/04/20 0403 09/04/20 0718 09/04/20 1118  GLUCAP 161* 133* 126* 127* 135*    Recent Results (from the past 240 hour(s))  Resp Panel by RT-PCR (Flu A&B, Covid) Nasopharyngeal Swab     Status: None   Collection Time: 08/31/20  2:55 PM   Specimen: Nasopharyngeal Swab; Nasopharyngeal(NP) swabs in vial transport medium  Result Value Ref Range Status   SARS Coronavirus 2 by RT PCR NEGATIVE NEGATIVE Final    Comment: (NOTE) SARS-CoV-2 target nucleic acids are NOT DETECTED.  The SARS-CoV-2 RNA is generally detectable in upper respiratory specimens during the acute phase of infection. The lowest concentration of SARS-CoV-2 viral copies this assay can detect is 138 copies/mL. A negative result does not preclude SARS-Cov-2 infection and should not be used as the sole basis for treatment or other patient management decisions. A negative result may occur with  improper specimen collection/handling, submission of specimen other than nasopharyngeal swab, presence of viral mutation(s) within the areas targeted by this assay, and inadequate number of viral copies(<138 copies/mL). A negative result must be combined with clinical observations,  patient history, and epidemiological information. The expected result is Negative.  Fact Sheet for Patients:  EntrepreneurPulse.com.au  Fact Sheet for Healthcare Providers:  IncredibleEmployment.be  This test is no t yet approved or cleared by the [place] FDA and  has been authorized for detection and/or diagnosis of SARS-CoV-2 by FDA under an Emergency Use Authorization (EUA). This EUA will remain  in effect (meaning this test can be used) for the duration of the COVID-19 declaration under Section 564(b)(1) of the Act, 21 U.S.C.section 360bbb-3(b)(1), unless the authorization is terminated  or revoked sooner.       Influenza A by PCR NEGATIVE NEGATIVE Final   Influenza B by PCR  NEGATIVE NEGATIVE Final    Comment: (NOTE) The Xpert Xpress SARS-CoV-2/FLU/RSV plus assay is intended as an aid in the diagnosis of influenza from Nasopharyngeal swab specimens and should not be used as a sole basis for treatment. Nasal washings and aspirates are unacceptable for Xpert Xpress SARS-CoV-2/FLU/RSV testing.  Fact Sheet for Patients: EntrepreneurPulse.com.au  Fact Sheet for Healthcare Providers: IncredibleEmployment.be  This test is not yet approved or cleared by the [place] FDA and has been authorized for detection and/or diagnosis of SARS-CoV-2 by FDA under an Emergency Use Authorization (EUA). This EUA will remain in effect (meaning this test can be used) for the duration of the COVID-19 declaration under Section 564(b)(1) of the Act, 21 U.S.C. section 360bbb-3(b)(1), unless the authorization is terminated or revoked.  Performed at [facility], [address] [place], [place] [address]       Radiology Studies: DG Abd 2 Views  Result Date: 09/03/2020 CLINICAL DATA:  Ileus EXAM: ABDOMEN - 2 VIEW COMPARISON:  None. FINDINGS: Significant gaseous distention of the stomach. Gas throughout nondistended large and small bowel. Large stool burden throughout the colon. No free air organomegaly. IMPRESSION: Significant gaseous distention of the stomach. Large stool burden throughout the colon. Electronically Signed   By: Rolm Baptise M.D.   On: 09/03/2020 14:54     Scheduled Meds:  amLODipine  2.5 mg Oral Daily   aspirin EC  81 mg Oral Daily   atorvastatin  10 mg Oral q1800   B-complex with vitamin C  1 tablet Oral Daily   brimonidine  1 drop Right Eye BID   cloNIDine  0.1 mg Oral TID   docusate sodium  100 mg Oral BID   dorzolamide-timolol  1 drop Right Eye BID   ferrous gluconate  324 mg Oral Daily   heparin injection (subcutaneous)  5,000 Units Subcutaneous Q8H   hydrALAZINE  50 mg Oral TID   insulin aspart  0-15 Units  Subcutaneous Q4H   latanoprost  1 drop Right Eye QHS   loratadine  10 mg Oral Daily   pantoprazole  40 mg Oral Daily   polyethylene glycol  17 g Oral Daily   simethicone  40 mg Oral QID     LOS: 4 days    Time spent: 35 minutes   Barton Dubois, MD [facility]  If 7PM-7AM, please contact night-coverage [email or web address] 09/04/2020, 2:10 PM

## 2020-09-04 NOTE — Progress Notes (Signed)
Physical Therapy Treatment Patient Details Name: Bethany [name] MRN: [id number] DOB: 12-14-1945 Today's Date: 09/04/2020    History of Present Illness Bethany [name] is a 75 y.o. female with medical history significant for atrial fibrillation, anemia, COPD, type 2 diabetes, hypertension, prior CVA, and GERD who presented to the ED on 7/3 for evaluation of right shoulder pain secondary to mechanical fall.  She was noted to have a displaced right distal radius fracture as well as displaced right distal ulna fracture and right humeral shaft fracture which was to be further evaluated by orthopedics in the outpatient setting.  She was seen today on 7/6 and she was noted to have insufficient reduction of the humeral shaft fracture as well as the distal radius and ulna fractures.  Plan is to perform closed reduction under general anesthesia and/or IV sedation, but given her multiple medical comorbidities she will be admitted to the hospital for further lab work and evaluation prior to the procedure.  Of note, she is legally blind.    PT Comments    Nurse tech assisting with today's session. Patient transitions to seated EOB with slow, labored movements requiring assist to upright trunk. Patient demonstrates good sitting balance and sitting tolerance EOB. Patient requires mod assist to transfer to standing with HHA and bed elevated secondary to LE weakness, impaired balance, and c/o painful gout in foot. Patient ambulates to chair at bedside with slow, shuffled steps requiring assist. Patient ends session seated in chair. Patient will benefit from continued physical therapy in hospital and recommended venue below to increase strength, balance, endurance for safe ADLs and gait.    Follow Up Recommendations  SNF     Equipment Recommendations  None recommended by PT    Recommendations for Other Services       Precautions / Restrictions Precautions Precautions: Fall Restrictions Weight Bearing  Restrictions: Yes RUE Weight Bearing: Non weight bearing    Mobility  Bed Mobility Overal bed mobility: Needs Assistance Bed Mobility: Supine to Sit     Supine to sit: Min assist;HOB elevated     General bed mobility comments: assist to upright trunk    Transfers Overall transfer level: Needs assistance Equipment used: 1 person hand held assist Transfers: Sit to/from [facility] Sit to Stand: Mod assist;From elevated surface Stand pivot transfers: Mod assist       General transfer comment: labored transition to standing with HHA  Ambulation/Gait Ambulation/Gait assistance: Mod assist Gait Distance (Feet): 3 Feet Assistive device: 1 person hand held assist Gait Pattern/deviations: Shuffle Gait velocity: decreased   General Gait Details: small, shuffled steps at bedside   Stairs             Wheelchair Mobility    Modified Rankin (Stroke Patients Only)       Balance Overall balance assessment: Needs assistance Sitting-balance support: Single extremity supported;Feet supported Sitting balance-Leahy Scale: Good Sitting balance - Comments: seated EOB   Standing balance support: Single extremity supported Standing balance-Leahy Scale: Poor Standing balance comment: fair/poor                            Cognition Arousal/Alertness: Awake/alert Behavior During Therapy: WFL for tasks assessed/performed Overall Cognitive Status: Within Functional Limits for tasks assessed  Exercises      General Comments        Pertinent Vitals/Pain Pain Assessment: No/denies pain    Home Living                      Prior Function            PT Goals (current goals can now be found in the care plan section) Acute Rehab PT Goals Patient Stated Goal: Return home PT Goal Formulation: With patient Time For Goal Achievement: 09/16/20 Potential to Achieve Goals:  Fair Progress towards PT goals: Progressing toward goals    Frequency    Min 3X/week      PT Plan Current plan remains appropriate    Co-evaluation              AM-PAC PT "6 Clicks" Mobility   Outcome Measure  Help needed turning from your back to your side while in a flat bed without using bedrails?: A Little Help needed moving from lying on your back to sitting on the side of a flat bed without using bedrails?: A Little Help needed moving to and from a bed to a chair (including a wheelchair)?: A Lot Help needed standing up from a chair using your arms (e.g., wheelchair or bedside chair)?: A Lot Help needed to walk in hospital room?: A Lot Help needed climbing 3-5 steps with a railing? : A Lot 6 Click Score: 14    End of Session Equipment Utilized During Treatment:  (R UE in sling) Activity Tolerance: Patient tolerated treatment well Patient left: with call bell/phone within reach;in chair;with family/visitor present;with nursing/sitter in room Nurse Communication: Mobility status PT Visit Diagnosis: Unsteadiness on feet (R26.81);Other abnormalities of gait and mobility (R26.89);Muscle weakness (generalized) (M62.81)     Time: 3299-2426 PT Time Calculation (min) (ACUTE ONLY): 18 min  Charges:  $Therapeutic Activity: 8-22 mins                     12:40 PM, 09/04/20 Mearl Latin PT, DPT Physical Therapist at [facility]

## 2020-09-04 NOTE — Progress Notes (Signed)
Patients daughter is asking for an enema. She said she talked to the doctor about it 7/10 am. I told her she is on two medications already and I would put a note in for the doctor to review.  Thanks

## 2020-09-05 DIAGNOSIS — S42301D Unspecified fracture of shaft of humerus, right arm, subsequent encounter for fracture with routine healing: Secondary | ICD-10-CM | POA: Diagnosis not present

## 2020-09-05 DIAGNOSIS — E875 Hyperkalemia: Secondary | ICD-10-CM | POA: Diagnosis not present

## 2020-09-05 DIAGNOSIS — I4821 Permanent atrial fibrillation: Secondary | ICD-10-CM | POA: Diagnosis not present

## 2020-09-05 DIAGNOSIS — G936 Cerebral edema: Secondary | ICD-10-CM | POA: Diagnosis not present

## 2020-09-05 DIAGNOSIS — E1151 Type 2 diabetes mellitus with diabetic peripheral angiopathy without gangrene: Secondary | ICD-10-CM | POA: Diagnosis not present

## 2020-09-05 DIAGNOSIS — I110 Hypertensive heart disease with heart failure: Secondary | ICD-10-CM | POA: Diagnosis not present

## 2020-09-05 DIAGNOSIS — I672 Cerebral atherosclerosis: Secondary | ICD-10-CM | POA: Diagnosis not present

## 2020-09-05 DIAGNOSIS — Z7401 Bed confinement status: Secondary | ICD-10-CM | POA: Diagnosis not present

## 2020-09-05 DIAGNOSIS — K567 Ileus, unspecified: Secondary | ICD-10-CM | POA: Diagnosis not present

## 2020-09-05 DIAGNOSIS — Z20822 Contact with and (suspected) exposure to covid-19: Secondary | ICD-10-CM | POA: Diagnosis not present

## 2020-09-05 DIAGNOSIS — I739 Peripheral vascular disease, unspecified: Secondary | ICD-10-CM | POA: Diagnosis not present

## 2020-09-05 DIAGNOSIS — S52601D Unspecified fracture of lower end of right ulna, subsequent encounter for closed fracture with routine healing: Secondary | ICD-10-CM | POA: Diagnosis not present

## 2020-09-05 DIAGNOSIS — Z9181 History of falling: Secondary | ICD-10-CM | POA: Diagnosis not present

## 2020-09-05 DIAGNOSIS — I5030 Unspecified diastolic (congestive) heart failure: Secondary | ICD-10-CM | POA: Diagnosis not present

## 2020-09-05 DIAGNOSIS — R279 Unspecified lack of coordination: Secondary | ICD-10-CM | POA: Diagnosis not present

## 2020-09-05 DIAGNOSIS — G8192 Hemiplegia, unspecified affecting left dominant side: Secondary | ICD-10-CM | POA: Diagnosis not present

## 2020-09-05 DIAGNOSIS — R2689 Other abnormalities of gait and mobility: Secondary | ICD-10-CM | POA: Diagnosis not present

## 2020-09-05 DIAGNOSIS — I129 Hypertensive chronic kidney disease with stage 1 through stage 4 chronic kidney disease, or unspecified chronic kidney disease: Secondary | ICD-10-CM | POA: Diagnosis not present

## 2020-09-05 DIAGNOSIS — I6782 Cerebral ischemia: Secondary | ICD-10-CM | POA: Diagnosis not present

## 2020-09-05 DIAGNOSIS — G8194 Hemiplegia, unspecified affecting left nondominant side: Secondary | ICD-10-CM | POA: Diagnosis not present

## 2020-09-05 DIAGNOSIS — I6523 Occlusion and stenosis of bilateral carotid arteries: Secondary | ICD-10-CM | POA: Diagnosis not present

## 2020-09-05 DIAGNOSIS — S52521S Torus fracture of lower end of right radius, sequela: Secondary | ICD-10-CM | POA: Diagnosis not present

## 2020-09-05 DIAGNOSIS — I517 Cardiomegaly: Secondary | ICD-10-CM | POA: Diagnosis not present

## 2020-09-05 DIAGNOSIS — I130 Hypertensive heart and chronic kidney disease with heart failure and stage 1 through stage 4 chronic kidney disease, or unspecified chronic kidney disease: Secondary | ICD-10-CM | POA: Diagnosis not present

## 2020-09-05 DIAGNOSIS — I639 Cerebral infarction, unspecified: Secondary | ICD-10-CM | POA: Diagnosis not present

## 2020-09-05 DIAGNOSIS — E1165 Type 2 diabetes mellitus with hyperglycemia: Secondary | ICD-10-CM | POA: Diagnosis not present

## 2020-09-05 DIAGNOSIS — R6889 Other general symptoms and signs: Secondary | ICD-10-CM | POA: Diagnosis not present

## 2020-09-05 DIAGNOSIS — S42351D Displaced comminuted fracture of shaft of humerus, right arm, subsequent encounter for fracture with routine healing: Secondary | ICD-10-CM | POA: Diagnosis not present

## 2020-09-05 DIAGNOSIS — J441 Chronic obstructive pulmonary disease with (acute) exacerbation: Secondary | ICD-10-CM | POA: Diagnosis not present

## 2020-09-05 DIAGNOSIS — I63511 Cerebral infarction due to unspecified occlusion or stenosis of right middle cerebral artery: Secondary | ICD-10-CM | POA: Diagnosis not present

## 2020-09-05 DIAGNOSIS — M6281 Muscle weakness (generalized): Secondary | ICD-10-CM | POA: Diagnosis not present

## 2020-09-05 DIAGNOSIS — J449 Chronic obstructive pulmonary disease, unspecified: Secondary | ICD-10-CM | POA: Diagnosis not present

## 2020-09-05 DIAGNOSIS — S52531D Colles' fracture of right radius, subsequent encounter for closed fracture with routine healing: Secondary | ICD-10-CM | POA: Diagnosis not present

## 2020-09-05 DIAGNOSIS — R4781 Slurred speech: Secondary | ICD-10-CM | POA: Diagnosis not present

## 2020-09-05 DIAGNOSIS — I700 Atherosclerosis of aorta: Secondary | ICD-10-CM | POA: Diagnosis not present

## 2020-09-05 DIAGNOSIS — N2581 Secondary hyperparathyroidism of renal origin: Secondary | ICD-10-CM | POA: Diagnosis not present

## 2020-09-05 DIAGNOSIS — S42321D Displaced transverse fracture of shaft of humerus, right arm, subsequent encounter for fracture with routine healing: Secondary | ICD-10-CM | POA: Diagnosis not present

## 2020-09-05 DIAGNOSIS — S52501D Unspecified fracture of the lower end of right radius, subsequent encounter for closed fracture with routine healing: Secondary | ICD-10-CM | POA: Diagnosis not present

## 2020-09-05 DIAGNOSIS — Z7984 Long term (current) use of oral hypoglycemic drugs: Secondary | ICD-10-CM | POA: Diagnosis not present

## 2020-09-05 DIAGNOSIS — Z743 Need for continuous supervision: Secondary | ICD-10-CM | POA: Diagnosis not present

## 2020-09-05 DIAGNOSIS — Z87891 Personal history of nicotine dependence: Secondary | ICD-10-CM | POA: Diagnosis not present

## 2020-09-05 DIAGNOSIS — Z299 Encounter for prophylactic measures, unspecified: Secondary | ICD-10-CM | POA: Diagnosis not present

## 2020-09-05 DIAGNOSIS — R29898 Other symptoms and signs involving the musculoskeletal system: Secondary | ICD-10-CM | POA: Diagnosis not present

## 2020-09-05 DIAGNOSIS — S42301A Unspecified fracture of shaft of humerus, right arm, initial encounter for closed fracture: Secondary | ICD-10-CM | POA: Diagnosis not present

## 2020-09-05 DIAGNOSIS — D631 Anemia in chronic kidney disease: Secondary | ICD-10-CM | POA: Diagnosis not present

## 2020-09-05 DIAGNOSIS — N1832 Chronic kidney disease, stage 3b: Secondary | ICD-10-CM | POA: Diagnosis not present

## 2020-09-05 DIAGNOSIS — S52601P Unspecified fracture of lower end of right ulna, subsequent encounter for closed fracture with malunion: Secondary | ICD-10-CM | POA: Diagnosis not present

## 2020-09-05 DIAGNOSIS — I5032 Chronic diastolic (congestive) heart failure: Secondary | ICD-10-CM

## 2020-09-05 DIAGNOSIS — R29810 Facial weakness: Secondary | ICD-10-CM | POA: Diagnosis not present

## 2020-09-05 DIAGNOSIS — I482 Chronic atrial fibrillation, unspecified: Secondary | ICD-10-CM | POA: Diagnosis not present

## 2020-09-05 DIAGNOSIS — E1122 Type 2 diabetes mellitus with diabetic chronic kidney disease: Secondary | ICD-10-CM | POA: Diagnosis not present

## 2020-09-05 DIAGNOSIS — Z7982 Long term (current) use of aspirin: Secondary | ICD-10-CM | POA: Diagnosis not present

## 2020-09-05 DIAGNOSIS — M1710 Unilateral primary osteoarthritis, unspecified knee: Secondary | ICD-10-CM | POA: Diagnosis not present

## 2020-09-05 DIAGNOSIS — Z66 Do not resuscitate: Secondary | ICD-10-CM | POA: Diagnosis not present

## 2020-09-05 DIAGNOSIS — T83511A Infection and inflammatory reaction due to indwelling urethral catheter, initial encounter: Secondary | ICD-10-CM | POA: Diagnosis not present

## 2020-09-05 DIAGNOSIS — I10 Essential (primary) hypertension: Secondary | ICD-10-CM | POA: Diagnosis not present

## 2020-09-05 DIAGNOSIS — R0602 Shortness of breath: Secondary | ICD-10-CM | POA: Diagnosis not present

## 2020-09-05 DIAGNOSIS — I358 Other nonrheumatic aortic valve disorders: Secondary | ICD-10-CM | POA: Diagnosis not present

## 2020-09-05 DIAGNOSIS — N390 Urinary tract infection, site not specified: Secondary | ICD-10-CM | POA: Diagnosis not present

## 2020-09-05 DIAGNOSIS — N184 Chronic kidney disease, stage 4 (severe): Secondary | ICD-10-CM | POA: Diagnosis not present

## 2020-09-05 DIAGNOSIS — I69322 Dysarthria following cerebral infarction: Secondary | ICD-10-CM | POA: Diagnosis not present

## 2020-09-05 LAB — GLUCOSE, CAPILLARY
Glucose-Capillary: 116 mg/dL — ABNORMAL HIGH (ref 70–99)
Glucose-Capillary: 119 mg/dL — ABNORMAL HIGH (ref 70–99)
Glucose-Capillary: 136 mg/dL — ABNORMAL HIGH (ref 70–99)
Glucose-Capillary: 140 mg/dL — ABNORMAL HIGH (ref 70–99)
Glucose-Capillary: 161 mg/dL — ABNORMAL HIGH (ref 70–99)

## 2020-09-05 LAB — RESP PANEL BY RT-PCR (FLU A&B, COVID) ARPGX2
Influenza A by PCR: NEGATIVE
Influenza B by PCR: NEGATIVE
SARS Coronavirus 2 by RT PCR: NEGATIVE

## 2020-09-05 MED ORDER — LATANOPROST 0.005 % OP SOLN
1.0000 [drp] | Freq: Every day | OPHTHALMIC | 12 refills | Status: AC
Start: 2020-09-05 — End: ?

## 2020-09-05 MED ORDER — DOCUSATE SODIUM 100 MG PO CAPS: 100.0000 mg | ORAL_CAPSULE | Freq: Two times a day (BID) | ORAL | Status: AC

## 2020-09-05 MED ORDER — DILTIAZEM HCL ER COATED BEADS 120 MG PO CP24: 120.0000 mg | ORAL_CAPSULE | Freq: Every day | ORAL | Status: AC

## 2020-09-05 MED ORDER — ACETAMINOPHEN 325 MG PO TABS: 650.0000 mg | ORAL_TABLET | Freq: Four times a day (QID) | ORAL | Status: AC | PRN

## 2020-09-05 MED ORDER — POLYETHYLENE GLYCOL 3350 17 G PO PACK: 17.0000 g | PACK | Freq: Every day | ORAL | 0 refills | Status: AC

## 2020-09-05 MED ORDER — FUROSEMIDE 40 MG PO TABS: 40.0000 mg | ORAL_TABLET | Freq: Every day | ORAL | Status: AC

## 2020-09-05 MED ORDER — METHOCARBAMOL 500 MG PO TABS: 500.0000 mg | ORAL_TABLET | Freq: Three times a day (TID) | ORAL | Status: AC | PRN

## 2020-09-05 MED ORDER — PANTOPRAZOLE SODIUM 40 MG PO TBEC: 40.0000 mg | DELAYED_RELEASE_TABLET | Freq: Every day | ORAL | Status: AC

## 2020-09-05 MED ORDER — OXYCODONE HCL 5 MG PO TABS: 5.0000 mg | ORAL_TABLET | Freq: Four times a day (QID) | ORAL | 0 refills | Status: AC | PRN

## 2020-09-05 MED ORDER — BISACODYL 10 MG RE SUPP
10.0000 mg | Freq: Every day | RECTAL | Status: AC | PRN
Start: 2020-09-05 — End: ?

## 2020-09-05 MED ORDER — B COMPLEX VITAMINS PO CAPS
1.0000 | ORAL_CAPSULE | Freq: Every day | ORAL | Status: AC
Start: 2020-09-05 — End: ?

## 2020-09-05 NOTE — Progress Notes (Signed)
Patient discharged to SNF- [facility]. Report was called and given to Ronnie Derby RN. Transported by [facility] of [facility] to an awaiting facility.Marland Kitchen

## 2020-09-05 NOTE — Progress Notes (Signed)
Nsg Discharge Note  Admit Date:  08/31/2020 Discharge date: 09/05/2020   Cherre Huger Hartt to be D/C'd Skilled nursing facility per MD order.  AVS completed.  Copy for chart, and copy for patient signed, and dated. Patient/caregiver able to verbalize understanding.  Discharge Medication: Allergies as of 09/05/2020       Reactions   Sulfa Antibiotics Hives, Swelling   Sulfamethoxazole Hives   Tape Other (See Comments)   Pulls skin off. Skin irritation and breakdown        Medication List     STOP taking these medications    candesartan 32 MG tablet Commonly known as: ATACAND   labetalol 200 MG tablet Commonly known as: NORMODYNE   oxyCODONE-acetaminophen 5-325 MG tablet Commonly known as: PERCOCET/ROXICET       TAKE these medications    acetaminophen 325 MG tablet Commonly known as: TYLENOL Take 2 tablets (650 mg total) by mouth every 6 (six) hours as needed for mild pain (or Fever >/= 101).   albuterol 108 (90 Base) MCG/ACT inhaler Commonly known as: VENTOLIN HFA Inhale 2 puffs into the lungs every 6 (six) hours as needed for wheezing or shortness of breath.   amLODipine 2.5 MG tablet Commonly known as: NORVASC Take 2.5 mg by mouth daily.   aspirin EC 81 MG tablet Take 81 mg by mouth daily. Swallow whole.   atorvastatin 10 MG tablet Commonly known as: LIPITOR Take 10 mg by mouth daily.   azelastine 0.1 % nasal spray Commonly known as: ASTELIN Place 1 spray into both nostrils as needed for rhinitis. Use in each nostril as directed   b complex vitamins capsule Take 1 capsule by mouth daily.   bisacodyl 10 MG suppository Commonly known as: DULCOLAX Place 1 suppository (10 mg total) rectally daily as needed for moderate constipation.   brimonidine 0.2 % ophthalmic solution Commonly known as: ALPHAGAN Place 1 drop into the right eye in the morning and at bedtime.   cetirizine 10 MG tablet Commonly known as: ZYRTEC Take 10 mg by mouth daily.   cloNIDine  0.1 MG tablet Commonly known as: CATAPRES Take 0.1 mg by mouth 3 (three) times daily.   diltiazem 120 MG 24 hr capsule Commonly known as: CARDIZEM CD Take 1 capsule (120 mg total) by mouth daily. What changed:  medication strength how much to take   docusate sodium 100 MG capsule Commonly known as: COLACE Take 1 capsule (100 mg total) by mouth 2 (two) times daily.   dorzolamide-timolol 22.3-6.8 MG/ML ophthalmic solution Commonly known as: COSOPT Place 1 drop into the right eye 2 (two) times daily.   FORA V30a Blood Glucose System Devi by Does not apply route.   furosemide 40 MG tablet Commonly known as: LASIX Take 1 tablet (40 mg total) by mouth daily. What changed: how much to take   glipiZIDE 2.5 MG 24 hr tablet Commonly known as: GLUCOTROL XL Take 2.5 mg by mouth as needed (for high bs).   hydrALAZINE 50 MG tablet Commonly known as: APRESOLINE Take 50 mg by mouth 3 (three) times daily.   Iron (Ferrous Gluconate) 256 (28 Fe) MG Tabs Take 325 mg by mouth daily.   latanoprost 0.005 % ophthalmic solution Commonly known as: XALATAN Place 1 drop into the right eye at bedtime.   Magnesium 250 MG Tabs Take 250 mg by mouth daily.   methocarbamol 500 MG tablet Commonly known as: Robaxin Take 1 tablet (500 mg total) by mouth every 8 (eight) hours as needed  for muscle spasms.   oxyCODONE 5 MG immediate release tablet Commonly known as: Oxy IR/ROXICODONE Take 1-2 tablets (5-10 mg total) by mouth every 6 (six) hours as needed for severe pain.   pantoprazole 40 MG tablet Commonly known as: PROTONIX Take 1 tablet (40 mg total) by mouth daily. Start taking on: September 06, 2020 What changed:  when to take this reasons to take this   polyethylene glycol 17 g packet Commonly known as: MIRALAX / GLYCOLAX Take 17 g by mouth daily. Start taking on: September 06, 2020               Discharge Care Instructions  (From admission, onward)           Start     Ordered    09/05/20 0000  Discharge wound care:       Comments: Check skin for integrity and/or breakdowns. Maintain area clean and dry, sling and splint to be wear at all times. Follow up with orthopedic service for further recommendations.   09/05/20 1317            Discharge Assessment: Vitals:   09/05/20 0407 09/05/20 1302  BP: (!) 158/66 (!) 149/67  Pulse: 79 81  Resp: 19 18  Temp: 99.1 F (37.3 C) 97.8 F (36.6 C)  SpO2:  100%   Skin clean, dry and intact without evidence of skin break down, no evidence of skin tears noted. IV catheter discontinued intact. Site without signs and symptoms of complications - no redness or edema noted at insertion site, patient denies c/o pain - only slight tenderness at site.  Dressing with slight pressure applied.  D/c Instructions-Education: Discharge instructions given to patient/family with verbalized understanding. D/c education completed with patient/family including follow up instructions, medication list, d/c activities limitations if indicated, with other d/c instructions as indicated by MD - patient able to verbalize understanding, all questions fully answered. Patient instructed to return to ED, call 911, or call MD for any changes in condition.  Patient escorted via [place], and D/C home via private auto.  Dorcas Mcmurray, LPN 1/66/0630 1:60 PM

## 2020-09-05 NOTE — Discharge Summary (Signed)
Physician Discharge Summary  Bethany [name] TGG:[id number] DOB: 1945-07-08 DOA: 08/31/2020  PCP: Glenda Chroman, MD  Admit date: 08/31/2020 Discharge date: 09/05/2020  Time spent: 35 minutes  Recommendations for Outpatient Follow-up:  Repeat CBC to follow hemoglobin and stability Repeat basic metabolic panel to follow lites and renal function Outpatient follow-up with orthopedic service in 10 days Close monitoring the patient volume status while checking daily weights.  Discharge Diagnoses:  Active Problems:   Chronic diastolic HF (heart failure) (HCC)   Humeral fracture   Atrial fibrillation, chronic (HCC)   Ileus (HCC) Hypertension Chronic kidney disease a stage IV Type 2 diabetes with nephropathy GERD   Discharge Condition: Stable and improved.  Discharge to skilled nursing facility for further care and rehabilitation.  Outpatient follow-up with orthopedic service in 10 days.  CODE STATUS: Full code.  Diet recommendation: Heart healthy and modified carbohydrate diet.  Filed Weights   08/31/20 1306  Weight: 102.5 kg    History of present illness:  As per H&P written by Dr. Manuella Ghazi on 08/31/2020. Bethany [name] is a 75 y.o. female with medical history significant for atrial fibrillation, anemia, COPD, type 2 diabetes, hypertension, prior CVA, and GERD who presented to the ED on 7/3 for evaluation of right shoulder pain secondary to mechanical fall.  She was noted to have a displaced right distal radius fracture as well as displaced right distal ulna fracture and right humeral shaft fracture which was to be further evaluated by orthopedics in the outpatient setting.  She was seen today on 7/6 and she was noted to have insufficient reduction of the humeral shaft fracture as well as the distal radius and ulna fractures.  Plan is to perform closed reduction under general anesthesia and/or IV sedation, but given her multiple medical comorbidities she will be admitted to the hospital for further  lab work and evaluation prior to the procedure.  Of note, she is legally blind.  Hospital Course:  Right upper extremity fractures -S/P closed reduction per orthopedics under anesthesia on 09/01/20 -PT/OT evaluation completed and recommending SNF -will follow post procedure recommendations by orthopedic service -continue Sling and none weight bearing of movement on RUE -continue PRN analgesics and PRN robaxin -outpatient follow up with orthopedic service in 10 days.   CKD IV -continue to maintain adequate hydration -follow volume status and repeat BMET to follow electrolytes and renal function   Type 2 diabetes -No significant hyperglycemia noted -continue Carb modified diet ordered   History of atrial fibrillation with bradycardia -Hr stable overall, with slight increases rate on activity  -will resume adjusted dose of cardizem -continue outpatient follow up with PCP and cardiology service.  -Not on anticoagulation as she has prior watchman device   History of hypertension-controlled -Continue current antihypertensive agents -Follow heart healthy diet  Chronic diastolic HF -compensated -follow daily weight and low sodium diet -continue BP control and resume lasix.   GERD -Continue Protonix   Iron deficiency anemia-stable -Continue iron supplementation -continue to monitor CBC intermittently to assess stability.   Class II obesity -Lifestyle changes as an outpatient -Low calorie diet and portion control discussed with patient. -Body mass index is 35.4 kg/m.    Legally blind -Will require placement on discharge given inability to use her dominant arm -Continue eyedrops and supportive care.   Nausea/vomiting/no BM/ileus -Findings on x-ray demonstrating stool burden and constipation. -Continue as needed antiemetics. -Patient discharged with instruction to continue using good bowel regimen (Colace and MiraLAX) and also to maintain adequate  hydration. -Dulcolax  suppository added as needed for moderate constipation symptoms. -Patient advised to continue increasing her physical activity.  Procedures: See below for x-ray reports Status post close reduction of right upper extremity fracture under anesthesia on 09/01/2020  Consultations: Orthopedic service  Discharge Exam: Vitals:   09/05/20 0407 09/05/20 1302  BP: (!) 158/66 (!) 149/67  Pulse: 79 81  Resp: 19 18  Temp: 99.1 F (37.3 C) 97.8 F (36.6 C)  SpO2:  100%   General exam: Alert, awake, oriented x 3; no chest pain, no fever, no further episode of nausea or vomiting; tolerating diet better.  Had bowel movement on 09/05/2020 prior to discharge.   Respiratory system: Clear to auscultation. Respiratory effort normal.  2 L nasal cannula in place (oxygen saturation 97-99%), mainly to support levels were receiving pain medication.  Patient does not use oxygen supplementation at home. Cardiovascular system:RRR. No rubs or gallops; no JVD. Gastrointestinal system: Abdomen is nondistended, soft and nontender. No organomegaly or masses felt. Normal bowel sounds heard. Central nervous system: Alert and oriented. No new focal neurological deficits. Extremities: No cyanosis or clubbing; right upper extremity with splint and sling in place. Skin: No petechiae. Psychiatry: Judgement and insight appear normal. Mood & affect appropriate.    Discharge Instructions   Discharge Instructions     (HEART FAILURE PATIENTS) Call MD:  Anytime you have any of the following symptoms: 1) 3 pound weight gain in 24 hours or 5 pounds in 1 week 2) shortness of breath, with or without a dry hacking cough 3) swelling in the hands, feet or stomach 4) if you have to sleep on extra pillows at night in order to breathe.   Complete by: As directed    Diet - low sodium heart healthy   Complete by: As directed    Discharge instructions   Complete by: As directed    Physical rehabilitation and conditioning as per skilled  nursing facility protocol Follow-up with orthopedic service in 10 days Maintain adequate hydration Check weight on daily basis Heart healthy modified carbohydrate diet Judicious analgesic therapy.   Discharge wound care:   Complete by: As directed    Check skin for integrity and/or breakdowns. Maintain area clean and dry, sling and splint to be wear at all times. Follow up with orthopedic service for further recommendations.   Increase activity slowly   Complete by: As directed       Allergies as of 09/05/2020       Reactions   Sulfa Antibiotics Hives, Swelling   Sulfamethoxazole Hives   Tape Other (See Comments)   Pulls skin off. Skin irritation and breakdown        Medication List     STOP taking these medications    candesartan 32 MG tablet Commonly known as: ATACAND   labetalol 200 MG tablet Commonly known as: NORMODYNE   oxyCODONE-acetaminophen 5-325 MG tablet Commonly known as: PERCOCET/ROXICET       TAKE these medications    acetaminophen 325 MG tablet Commonly known as: TYLENOL Take 2 tablets (650 mg total) by mouth every 6 (six) hours as needed for mild pain (or Fever >/= 101).   albuterol 108 (90 Base) MCG/ACT inhaler Commonly known as: VENTOLIN HFA Inhale 2 puffs into the lungs every 6 (six) hours as needed for wheezing or shortness of breath.   amLODipine 2.5 MG tablet Commonly known as: NORVASC Take 2.5 mg by mouth daily.   aspirin EC 81 MG tablet Take 81 mg by  mouth daily. Swallow whole.   atorvastatin 10 MG tablet Commonly known as: LIPITOR Take 10 mg by mouth daily.   azelastine 0.1 % nasal spray Commonly known as: ASTELIN Place 1 spray into both nostrils as needed for rhinitis. Use in each nostril as directed   b complex vitamins capsule Take 1 capsule by mouth daily.   bisacodyl 10 MG suppository Commonly known as: DULCOLAX Place 1 suppository (10 mg total) rectally daily as needed for moderate constipation.   brimonidine 0.2  % ophthalmic solution Commonly known as: ALPHAGAN Place 1 drop into the right eye in the morning and at bedtime.   cetirizine 10 MG tablet Commonly known as: ZYRTEC Take 10 mg by mouth daily.   cloNIDine 0.1 MG tablet Commonly known as: CATAPRES Take 0.1 mg by mouth 3 (three) times daily.   diltiazem 120 MG 24 hr capsule Commonly known as: CARDIZEM CD Take 1 capsule (120 mg total) by mouth daily. What changed:  medication strength how much to take   docusate sodium 100 MG capsule Commonly known as: COLACE Take 1 capsule (100 mg total) by mouth 2 (two) times daily.   dorzolamide-timolol 22.3-6.8 MG/ML ophthalmic solution Commonly known as: COSOPT Place 1 drop into the right eye 2 (two) times daily.   FORA V30a Blood Glucose System Devi by Does not apply route.   furosemide 40 MG tablet Commonly known as: LASIX Take 1 tablet (40 mg total) by mouth daily. What changed: how much to take   glipiZIDE 2.5 MG 24 hr tablet Commonly known as: GLUCOTROL XL Take 2.5 mg by mouth as needed (for high bs).   hydrALAZINE 50 MG tablet Commonly known as: APRESOLINE Take 50 mg by mouth 3 (three) times daily.   Iron (Ferrous Gluconate) 256 (28 Fe) MG Tabs Take 325 mg by mouth daily.   latanoprost 0.005 % ophthalmic solution Commonly known as: XALATAN Place 1 drop into the right eye at bedtime.   Magnesium 250 MG Tabs Take 250 mg by mouth daily.   methocarbamol 500 MG tablet Commonly known as: Robaxin Take 1 tablet (500 mg total) by mouth every 8 (eight) hours as needed for muscle spasms.   oxyCODONE 5 MG immediate release tablet Commonly known as: Oxy IR/ROXICODONE Take 1-2 tablets (5-10 mg total) by mouth every 6 (six) hours as needed for severe pain.   pantoprazole 40 MG tablet Commonly known as: PROTONIX Take 1 tablet (40 mg total) by mouth daily. Start taking on: September 06, 2020 What changed:  when to take this reasons to take this   polyethylene glycol 17 g  packet Commonly known as: MIRALAX / GLYCOLAX Take 17 g by mouth daily. Start taking on: September 06, 2020               Discharge Care Instructions  (From admission, onward)           Start     Ordered   09/05/20 0000  Discharge wound care:       Comments: Check skin for integrity and/or breakdowns. Maintain area clean and dry, sling and splint to be wear at all times. Follow up with orthopedic service for further recommendations.   09/05/20 1317           Allergies  Allergen Reactions   Sulfa Antibiotics Hives and Swelling   Sulfamethoxazole Hives   Tape Other (See Comments)    Pulls skin off. Skin irritation and breakdown    Follow-up Information     Vyas,  Dhruv B, MD. Schedule an appointment as soon as possible for a visit in 2 week(s).   Specialty: Internal Medicine Why: after discharge from SNF Contact information: [address] [address] [address]         Mordecai Rasmussen, MD. Schedule an appointment as soon as possible for a visit in 10 day(s).   Specialties: Orthopedic Surgery, Sports Medicine Contact information: [address] [place] [address] [phone]                  The results of significant diagnostics from this hospitalization (including imaging, microbiology, ancillary and laboratory) are listed below for reference.    Significant Diagnostic Studies: DG Shoulder Right  Result Date: 08/28/2020 CLINICAL DATA:  Status post fall. EXAM: RIGHT SHOULDER - 2+ VIEW COMPARISON:  None FINDINGS: There is a comminuted fracture deformity involving the mid shaft of the right humerus. Lateral displacement of the distal fracture fragments identified by 1/2 shaft's width. No dislocation identified. Degenerative changes noted at the [facility] joint. IMPRESSION: Comminuted fracture deformity involves the mid shaft of the right humerus. Electronically Signed   By: Kerby Moors M.D.   On: 08/28/2020 11:22   DG Wrist 2 Views Right  Result  Date: 09/01/2020 CLINICAL DATA:  Close reduction and splinting of the right wrist and humerus. EXAM: DG C-ARM 1-60 MIN; RIGHT HUMERUS - 2+ VIEW; RIGHT WRIST - 2 VIEW FLUOROSCOPY TIME:  Fluoroscopy Time:  39 seconds Number of Acquired Spot Images: 8 COMPARISON:  August 31, 2020. FINDINGS: Eight C-arm fluoroscopic images were obtained intraoperatively and submitted for post operative interpretation. These images demonstrate closed reduction and splinting of the right wrist and humerus. Alignment of the humerus is improved with approximately half shaft with displacement on these images. Alignment at the wrist is also improved with slight radial and dorsal displacement of the distal radial fracture fragment. Slight dorsal displacement of the distal ulnar fracture as well. Please see prior radiographs for further characterization of these fractures. Please see the performing provider's procedural report for further detail. IMPRESSION: Intraoperative fluoroscopy, as detailed above Electronically Signed   By: Margaretha Sheffield MD   On: 09/01/2020 12:50   DG Wrist Complete Right  Result Date: 09/04/2020 Formatting of this result is different from the original. X-rays of the distal radius were obtained in clinic today and demonstrates a displaced distal radius and ulna fracture.  Distal radius is partially reduced, with persistent dorsal translation and angulation.  On the AP view, the distal radius and distal ulna are both translated radially.  There is loss of height of the distal radius.   Impression: Partially reduced right distal radius fracture with persistent dorsal angulation and translation.  Distal radius and ulnar fractures deviated radially.  DG Wrist Complete Right  Result Date: 08/28/2020 CLINICAL DATA:  76 year old with fall and right arm pain. EXAM: RIGHT WRIST - COMPLETE 3+ VIEW COMPARISON:  None. FINDINGS: Dorsally displaced fracture of the distal radius. Distal radial fracture appears to be mildly  comminuted. Mildly displaced fracture of the distal ulna. There is a small defect along the radial and distal aspect of the scaphoid of uncertain chronicity. Questionable loose body or bone fragment adjacent to the trapezium. Margins of the scaphoid are relatively sclerotic and suspect this is more chronic. IMPRESSION: Displaced fractures of the distal right radius and ulna. Irregularity involving the scaphoid but this could be chronic. Electronically Signed   By: Markus Daft M.D.   On: 08/28/2020 11:28  CT Head Wo Contrast  Result Date: 08/28/2020 CLINICAL DATA:  Fall today, patient c/o hitting right side head.denies pain at this time and denies LOCHead trauma, minor (Age >= 65y) EXAM: CT HEAD WITHOUT CONTRAST TECHNIQUE: Contiguous axial images were obtained from the base of the skull through the vertex without intravenous contrast. COMPARISON:  None. FINDINGS: Brain: No acute intracranial hemorrhage. No focal mass lesion. No CT evidence of acute infarction. No midline shift or mass effect. No hydrocephalus. Basilar cisterns are patent. Vascular: No hyperdense vessel or unexpected calcification. Skull: Normal. Negative for fracture or focal lesion. Sinuses/Orbits: Paranasal sinuses and mastoid air cells are clear. Orbits are clear. Other: None IMPRESSION: No intracranial trauma Electronically Signed   By: Suzy Bouchard M.D.   On: 08/28/2020 11:31   DG Abd 2 Views  Result Date: 09/03/2020 CLINICAL DATA:  Ileus EXAM: ABDOMEN - 2 VIEW COMPARISON:  None. FINDINGS: Significant gaseous distention of the stomach. Gas throughout nondistended large and small bowel. Large stool burden throughout the colon. No free air organomegaly. IMPRESSION: Significant gaseous distention of the stomach. Large stool burden throughout the colon. Electronically Signed   By: Rolm Baptise M.D.   On: 09/03/2020 14:54   DG Humerus Right  Result Date: 09/04/2020 Formatting of this result is different from the original. X-rays of the  humerus, and a splint were obtained in clinic today.  On the AP view, the midshaft humerus fracture is in good alignment.  However, on the lateral view, there is clear varus angulation.  Minimal comminution.  Fracture is primarily transverse.   Impression: Right humerus fracture in varus alignment  DG Humerus Right  Result Date: 09/01/2020 CLINICAL DATA:  Close reduction and splinting of the right wrist and humerus. EXAM: DG C-ARM 1-60 MIN; RIGHT HUMERUS - 2+ VIEW; RIGHT WRIST - 2 VIEW FLUOROSCOPY TIME:  Fluoroscopy Time:  39 seconds Number of Acquired Spot Images: 8 COMPARISON:  August 31, 2020. FINDINGS: Eight C-arm fluoroscopic images were obtained intraoperatively and submitted for post operative interpretation. These images demonstrate closed reduction and splinting of the right wrist and humerus. Alignment of the humerus is improved with approximately half shaft with displacement on these images. Alignment at the wrist is also improved with slight radial and dorsal displacement of the distal radial fracture fragment. Slight dorsal displacement of the distal ulnar fracture as well. Please see prior radiographs for further characterization of these fractures. Please see the performing provider's procedural report for further detail. IMPRESSION: Intraoperative fluoroscopy, as detailed above Electronically Signed   By: Margaretha Sheffield MD   On: 09/01/2020 12:50   DG Humerus Right  Result Date: 08/28/2020 CLINICAL DATA:  Fall, right arm pain EXAM: RIGHT HUMERUS - 2+ VIEW COMPARISON:  None. FINDINGS: Displaced transverse fracture through the mid shaft of the humeral diaphysis. There is approximately 2 cm of displacement. There is associated soft tissue swelling. IMPRESSION: Acute displaced transverse fracture through the mid shaft of the humeral diaphysis. Electronically Signed   By: Jacqulynn Cadet M.D.   On: 08/28/2020 11:23   DG C-Arm 1-60 Min  Result Date: 09/01/2020 CLINICAL DATA:  Close reduction and  splinting of the right wrist and humerus. EXAM: DG C-ARM 1-60 MIN; RIGHT HUMERUS - 2+ VIEW; RIGHT WRIST - 2 VIEW FLUOROSCOPY TIME:  Fluoroscopy Time:  39 seconds Number of Acquired Spot Images: 8 COMPARISON:  August 31, 2020. FINDINGS: Eight C-arm fluoroscopic images were obtained intraoperatively and submitted for post operative interpretation. These images demonstrate closed reduction and splinting of  the right wrist and humerus. Alignment of the humerus is improved with approximately half shaft with displacement on these images. Alignment at the wrist is also improved with slight radial and dorsal displacement of the distal radial fracture fragment. Slight dorsal displacement of the distal ulnar fracture as well. Please see prior radiographs for further characterization of these fractures. Please see the performing provider's procedural report for further detail. IMPRESSION: Intraoperative fluoroscopy, as detailed above Electronically Signed   By: Margaretha Sheffield MD   On: 09/01/2020 12:50   DG C-Arm 1-60 Min  Result Date: 09/01/2020 CLINICAL DATA:  Close reduction and splinting of the right wrist and humerus. EXAM: DG C-ARM 1-60 MIN; RIGHT HUMERUS - 2+ VIEW; RIGHT WRIST - 2 VIEW FLUOROSCOPY TIME:  Fluoroscopy Time:  39 seconds Number of Acquired Spot Images: 8 COMPARISON:  August 31, 2020. FINDINGS: Eight C-arm fluoroscopic images were obtained intraoperatively and submitted for post operative interpretation. These images demonstrate closed reduction and splinting of the right wrist and humerus. Alignment of the humerus is improved with approximately half shaft with displacement on these images. Alignment at the wrist is also improved with slight radial and dorsal displacement of the distal radial fracture fragment. Slight dorsal displacement of the distal ulnar fracture as well. Please see prior radiographs for further characterization of these fractures. Please see the performing provider's procedural report for  further detail. IMPRESSION: Intraoperative fluoroscopy, as detailed above Electronically Signed   By: Margaretha Sheffield MD   On: 09/01/2020 12:50    Microbiology: Recent Results (from the past 240 hour(s))  Resp Panel by RT-PCR (Flu A&B, Covid) Nasopharyngeal Swab     Status: None   Collection Time: 08/31/20  2:55 PM   Specimen: Nasopharyngeal Swab; Nasopharyngeal(NP) swabs in vial transport medium  Result Value Ref Range Status   SARS Coronavirus 2 by RT PCR NEGATIVE NEGATIVE Final    Comment: (NOTE) SARS-CoV-2 target nucleic acids are NOT DETECTED.  The SARS-CoV-2 RNA is generally detectable in upper respiratory specimens during the acute phase of infection. The lowest concentration of SARS-CoV-2 viral copies this assay can detect is 138 copies/mL. A negative result does not preclude SARS-Cov-2 infection and should not be used as the sole basis for treatment or other patient management decisions. A negative result may occur with  improper specimen collection/handling, submission of specimen other than nasopharyngeal swab, presence of viral mutation(s) within the areas targeted by this assay, and inadequate number of viral copies(<138 copies/mL). A negative result must be combined with clinical observations, patient history, and epidemiological information. The expected result is Negative.  Fact Sheet for Patients:  EntrepreneurPulse.com.au  Fact Sheet for Healthcare Providers:  IncredibleEmployment.be  This test is no t yet approved or cleared by the [place] FDA and  has been authorized for detection and/or diagnosis of SARS-CoV-2 by FDA under an Emergency Use Authorization (EUA). This EUA will remain  in effect (meaning this test can be used) for the duration of the COVID-19 declaration under Section 564(b)(1) of the Act, 21 U.S.C.section 360bbb-3(b)(1), unless the authorization is terminated  or revoked sooner.       Influenza A by  PCR NEGATIVE NEGATIVE Final   Influenza B by PCR NEGATIVE NEGATIVE Final    Comment: (NOTE) The Xpert Xpress SARS-CoV-2/FLU/RSV plus assay is intended as an aid in the diagnosis of influenza from Nasopharyngeal swab specimens and should not be used as a sole basis for treatment. Nasal washings and aspirates are unacceptable for Xpert Xpress SARS-CoV-2/FLU/RSV testing.  Fact Sheet for Patients: EntrepreneurPulse.com.au  Fact Sheet for Healthcare Providers: IncredibleEmployment.be  This test is not yet approved or cleared by the [place] FDA and has been authorized for detection and/or diagnosis of SARS-CoV-2 by FDA under an Emergency Use Authorization (EUA). This EUA will remain in effect (meaning this test can be used) for the duration of the COVID-19 declaration under Section 564(b)(1) of the Act, 21 U.S.C. section 360bbb-3(b)(1), unless the authorization is terminated or revoked.  Performed at [facility], [address] [place], [place] [address]      Labs: Basic Metabolic Panel: Recent Labs  Lab 08/31/20 1356 09/01/20 0611 09/02/20 0355 09/03/20 0419  NA 137 139 141 142  K 4.1 4.8 4.7 4.6  CL 107 109 107 107  CO2 22 23 26 28   GLUCOSE 140* 113* 143* 149*  BUN 64* 59* 53* 53*  CREATININE 2.29* 2.15* 1.91* 1.74*  CALCIUM 8.6* 8.6* 8.9 8.9  MG  --  2.1 2.1  --     CBC: Recent Labs  Lab 08/31/20 1356 09/01/20 0611 09/02/20 0355 09/03/20 0419  WBC 7.7 6.3 7.4 8.9  HGB 9.0* 9.0* 9.8* 9.5*  HCT 28.2* 28.4* 31.3* 30.5*  MCV 102.5* 101.8* 104.0* 103.4*  PLT 175 185 204 203    CBG: Recent Labs  Lab 09/04/20 2005 09/05/20 0000 09/05/20 0404 09/05/20 0725 09/05/20 1105  GLUCAP 123* 119* 136* 116* 161*    Signed:  Barton Dubois MD.  [facility] 09/05/2020, 1:21 PM

## 2020-09-05 NOTE — TOC Transition Note (Signed)
Transition of Care [facility]) - CM/SW Discharge Note   Patient Details  Name: Bethany [name] MRN: [id number] Date of Birth: 09-Dec-1945  Transition of Care [facility]) CM/SW Contact:  Salome Arnt, LCSW Phone Number: 09/05/2020, 2:59 PM   Clinical Narrative: Pt d/c today to SNF. [facility] offered bed and daughter accepts. Authorization received and given to [facility]. [phone] with next review 7/13.) Daughter updated. She requests transport via [facility]. COVID negative 09/05/20. D/C summary sent to facility. RN given number to call report.       Final next level of care: Skilled Nursing Facility Barriers to Discharge: Barriers Resolved   Patient Goals and CMS Choice Patient states their goals for this hospitalization and ongoing recovery are:: Rehab with SNF CMS Medicare.gov Compare Post Acute Care list provided to:: Patient Choice offered to / list presented to : Patient  Discharge Placement   Existing PASRR number confirmed : 09/05/20          Patient chooses bed at: Other - please specify in the comment section below: ([facility]) Patient to be transferred to facility by: [facility] Name of family member notified: Macky Lower, daughter Patient and family notified of of transfer: 09/05/20  Discharge Plan and Services                                     Social Determinants of Health (SDOH) Interventions     Readmission Risk Interventions No flowsheet data found.

## 2020-09-05 NOTE — Care Management Important Message (Signed)
Important Message  Patient Details  Name: Bethany [name] MRN: [id number] Date of Birth: 1945/03/22   Medicare Important Message Given:  Yes     Tommy Medal 09/05/2020, 1:30 PM

## 2020-09-06 ENCOUNTER — Telehealth: Payer: Self-pay | Admitting: Orthopedic Surgery

## 2020-09-06 NOTE — Telephone Encounter (Signed)
Please call Ronnie Derby at [facility]   [phone].   They want to know about the patient dressing.

## 2020-09-08 DIAGNOSIS — E1165 Type 2 diabetes mellitus with hyperglycemia: Secondary | ICD-10-CM | POA: Diagnosis not present

## 2020-09-08 DIAGNOSIS — I10 Essential (primary) hypertension: Secondary | ICD-10-CM | POA: Diagnosis not present

## 2020-09-08 DIAGNOSIS — Z299 Encounter for prophylactic measures, unspecified: Secondary | ICD-10-CM | POA: Diagnosis not present

## 2020-09-08 DIAGNOSIS — S52601P Unspecified fracture of lower end of right ulna, subsequent encounter for closed fracture with malunion: Secondary | ICD-10-CM | POA: Diagnosis not present

## 2020-09-08 DIAGNOSIS — S52521S Torus fracture of lower end of right radius, sequela: Secondary | ICD-10-CM | POA: Diagnosis not present

## 2020-09-08 DIAGNOSIS — S42301A Unspecified fracture of shaft of humerus, right arm, initial encounter for closed fracture: Secondary | ICD-10-CM | POA: Diagnosis not present

## 2020-09-09 ENCOUNTER — Other Ambulatory Visit: Payer: Self-pay

## 2020-09-09 ENCOUNTER — Encounter: Payer: Self-pay | Admitting: Orthopedic Surgery

## 2020-09-09 ENCOUNTER — Ambulatory Visit (INDEPENDENT_AMBULATORY_CARE_PROVIDER_SITE_OTHER): Payer: Medicare Other | Admitting: Orthopedic Surgery

## 2020-09-09 ENCOUNTER — Ambulatory Visit: Payer: Medicare Other

## 2020-09-09 DIAGNOSIS — S42321D Displaced transverse fracture of shaft of humerus, right arm, subsequent encounter for fracture with routine healing: Secondary | ICD-10-CM | POA: Diagnosis not present

## 2020-09-09 DIAGNOSIS — S52531D Colles' fracture of right radius, subsequent encounter for closed fracture with routine healing: Secondary | ICD-10-CM | POA: Diagnosis not present

## 2020-09-09 NOTE — Progress Notes (Signed)
Orthopaedic Postop Note  Assessment: Bethany [name] is a 75 y.o. female s/p closed reduction of right humeral shaft fracture and right distal radius fracture  DOS: 09/01/20  Plan: Repeat radiographs were obtained in clinic today, and demonstrates acceptable alignment.  She does have some varus angulation of the humeral shaft fracture.  There is been no obvious interval displacement of the distal radius fracture.  Regardless, both fractures are difficult, and in many cases would require surgery.  However, given her medical comorbidities and functional status, we will continue to treat these without surgery.  The splint was evaluated in great detail.  She does have some pain and pressure within the axilla, but we placed some additional padding, and the patient was pleased.  She can undo the sling around her neck, when in bed, but the sling is there to provide additional support given the weight of the current splints.  Continue with medications as needed.  We discussed a bowel regimen, and I recommended this continue to the facility where she is currently.  Keep the splint clean, dry and intact.  We will plan to see her back in approximately 10 days for repeat evaluation, and transition to a fracture brace with a short arm cast.   Follow-up: Return in about 11 days (around 09/20/2020). XR at next visit: Right humerus and right distal radius  Subjective:  Chief Complaint  Patient presents with   Follow-up    Recheck on right wrist fracture and right humerus fracture, DOS 09-01-20.    History of Present Illness: Bethany [name] is a 75 y.o. female who presents following the above stated procedure.  She has been doing well.  She was discharged from the hospital, and is currently in a rehab facility.  She continues to have pain in her arm, both in the wrist and the arm.  She has noticed some swelling in her fingers.  She is also having some pain in the axilla.  She had some issues with an ileus while  admitted after surgery, and admits that she has remained constipated.  However, she is passing gas at this point.  Review of Systems: No fevers or chills No numbness or tingling No Chest Pain No shortness of breath +constipation    Objective: There were no vitals taken for this visit.  Physical Exam:   Elderly female.  Seated in a wheelchair.  Legally blind.  Answers questions appropriately.  Splint remains intact.  It is clean and dry.  Sling is causing some compression around her neck, but there is no skin breakdown.  Some mild irritation of the skin within the axilla.  Fingers are swollen, but she has intact sensation.  She is able to flex and extend all fingers.  No skin breakdown at the periphery of the splints.  IMAGING: I personally ordered and reviewed the following images:  X-rays of the right humerus, it in a splint were obtained in clinic today demonstrates acceptable alignment overall.  There is a mild varus alignment at the fracture site, otherwise the fracture is mildly distracted.  No evidence of callus formation.  Impression: Mild varus angulation of a right humeral shaft fracture  X-rays of the right distal radius were obtained in clinic today, in the splint, and demonstrates displaced distal radius and ulna fractures.  There is mild radial translation of both fractures.  The distal radius fracture is dorsally translated on the lateral view.  No interval displacement of the fracture sites since closed reduction.  Impression:  Right distal radius and ulna fractures, with radial translation, mild dorsal translation.  Acceptable alignment overall.   Mordecai Rasmussen, MD 09/09/2020 10:39 AM

## 2020-09-12 DIAGNOSIS — I129 Hypertensive chronic kidney disease with stage 1 through stage 4 chronic kidney disease, or unspecified chronic kidney disease: Secondary | ICD-10-CM | POA: Diagnosis not present

## 2020-09-12 DIAGNOSIS — D631 Anemia in chronic kidney disease: Secondary | ICD-10-CM | POA: Diagnosis not present

## 2020-09-12 DIAGNOSIS — E1122 Type 2 diabetes mellitus with diabetic chronic kidney disease: Secondary | ICD-10-CM | POA: Diagnosis not present

## 2020-09-12 DIAGNOSIS — N2581 Secondary hyperparathyroidism of renal origin: Secondary | ICD-10-CM | POA: Diagnosis not present

## 2020-09-12 DIAGNOSIS — N1832 Chronic kidney disease, stage 3b: Secondary | ICD-10-CM | POA: Diagnosis not present

## 2020-09-13 ENCOUNTER — Telehealth: Payer: Self-pay | Admitting: Orthopedic Surgery

## 2020-09-13 DIAGNOSIS — I739 Peripheral vascular disease, unspecified: Secondary | ICD-10-CM | POA: Diagnosis not present

## 2020-09-13 DIAGNOSIS — M1710 Unilateral primary osteoarthritis, unspecified knee: Secondary | ICD-10-CM | POA: Diagnosis not present

## 2020-09-13 DIAGNOSIS — I10 Essential (primary) hypertension: Secondary | ICD-10-CM | POA: Diagnosis not present

## 2020-09-13 DIAGNOSIS — I5030 Unspecified diastolic (congestive) heart failure: Secondary | ICD-10-CM | POA: Diagnosis not present

## 2020-09-13 DIAGNOSIS — Z299 Encounter for prophylactic measures, unspecified: Secondary | ICD-10-CM | POA: Diagnosis not present

## 2020-09-13 NOTE — Telephone Encounter (Signed)
Called and left VM for call back if further assistance is needed, no information requested to leave on VM.

## 2020-09-13 NOTE — Telephone Encounter (Signed)
I had a nurse, Ms. Matilde Haymaker from [facility] call regarding this patient.  She had questions regarding this patient's wound care and weight bearing on her arm.  Evidently the transportation that brought her did not give them back the orders that Dr. Amedeo Kinsman filled out.  I have faxed her AVS but they still wanted to speak with you.  Would you please call Ms.Gallimore from [facility] ext. [phone]?    She said if she didn't get her you could leave a message  Thanks

## 2020-09-16 DIAGNOSIS — D631 Anemia in chronic kidney disease: Secondary | ICD-10-CM | POA: Diagnosis not present

## 2020-09-16 DIAGNOSIS — S42301A Unspecified fracture of shaft of humerus, right arm, initial encounter for closed fracture: Secondary | ICD-10-CM | POA: Diagnosis not present

## 2020-09-16 DIAGNOSIS — I639 Cerebral infarction, unspecified: Secondary | ICD-10-CM | POA: Diagnosis not present

## 2020-09-16 DIAGNOSIS — D649 Anemia, unspecified: Secondary | ICD-10-CM | POA: Diagnosis not present

## 2020-09-16 DIAGNOSIS — T83511A Infection and inflammatory reaction due to indwelling urethral catheter, initial encounter: Secondary | ICD-10-CM | POA: Diagnosis not present

## 2020-09-16 DIAGNOSIS — K219 Gastro-esophageal reflux disease without esophagitis: Secondary | ICD-10-CM | POA: Diagnosis not present

## 2020-09-16 DIAGNOSIS — S52691D Other fracture of lower end of right ulna, subsequent encounter for closed fracture with routine healing: Secondary | ICD-10-CM | POA: Diagnosis not present

## 2020-09-16 DIAGNOSIS — J441 Chronic obstructive pulmonary disease with (acute) exacerbation: Secondary | ICD-10-CM | POA: Diagnosis not present

## 2020-09-16 DIAGNOSIS — G936 Cerebral edema: Secondary | ICD-10-CM | POA: Diagnosis not present

## 2020-09-16 DIAGNOSIS — I358 Other nonrheumatic aortic valve disorders: Secondary | ICD-10-CM | POA: Diagnosis not present

## 2020-09-16 DIAGNOSIS — R0602 Shortness of breath: Secondary | ICD-10-CM | POA: Diagnosis not present

## 2020-09-16 DIAGNOSIS — R2689 Other abnormalities of gait and mobility: Secondary | ICD-10-CM | POA: Diagnosis not present

## 2020-09-16 DIAGNOSIS — G8194 Hemiplegia, unspecified affecting left nondominant side: Secondary | ICD-10-CM | POA: Diagnosis not present

## 2020-09-16 DIAGNOSIS — S52501D Unspecified fracture of the lower end of right radius, subsequent encounter for closed fracture with routine healing: Secondary | ICD-10-CM | POA: Diagnosis not present

## 2020-09-16 DIAGNOSIS — R55 Syncope and collapse: Secondary | ICD-10-CM | POA: Diagnosis not present

## 2020-09-16 DIAGNOSIS — Z66 Do not resuscitate: Secondary | ICD-10-CM | POA: Diagnosis not present

## 2020-09-16 DIAGNOSIS — I509 Heart failure, unspecified: Secondary | ICD-10-CM | POA: Diagnosis not present

## 2020-09-16 DIAGNOSIS — I63511 Cerebral infarction due to unspecified occlusion or stenosis of right middle cerebral artery: Secondary | ICD-10-CM | POA: Diagnosis not present

## 2020-09-16 DIAGNOSIS — I482 Chronic atrial fibrillation, unspecified: Secondary | ICD-10-CM | POA: Diagnosis not present

## 2020-09-16 DIAGNOSIS — I10 Essential (primary) hypertension: Secondary | ICD-10-CM | POA: Diagnosis not present

## 2020-09-16 DIAGNOSIS — I5032 Chronic diastolic (congestive) heart failure: Secondary | ICD-10-CM | POA: Diagnosis not present

## 2020-09-16 DIAGNOSIS — M1710 Unilateral primary osteoarthritis, unspecified knee: Secondary | ICD-10-CM | POA: Diagnosis not present

## 2020-09-16 DIAGNOSIS — R1310 Dysphagia, unspecified: Secondary | ICD-10-CM | POA: Diagnosis not present

## 2020-09-16 DIAGNOSIS — E1165 Type 2 diabetes mellitus with hyperglycemia: Secondary | ICD-10-CM | POA: Diagnosis not present

## 2020-09-16 DIAGNOSIS — I69354 Hemiplegia and hemiparesis following cerebral infarction affecting left non-dominant side: Secondary | ICD-10-CM | POA: Diagnosis not present

## 2020-09-16 DIAGNOSIS — I517 Cardiomegaly: Secondary | ICD-10-CM | POA: Diagnosis not present

## 2020-09-16 DIAGNOSIS — I69391 Dysphagia following cerebral infarction: Secondary | ICD-10-CM | POA: Diagnosis not present

## 2020-09-16 DIAGNOSIS — I130 Hypertensive heart and chronic kidney disease with heart failure and stage 1 through stage 4 chronic kidney disease, or unspecified chronic kidney disease: Secondary | ICD-10-CM | POA: Diagnosis not present

## 2020-09-16 DIAGNOSIS — Z7984 Long term (current) use of oral hypoglycemic drugs: Secondary | ICD-10-CM | POA: Diagnosis not present

## 2020-09-16 DIAGNOSIS — G8192 Hemiplegia, unspecified affecting left dominant side: Secondary | ICD-10-CM | POA: Diagnosis not present

## 2020-09-16 DIAGNOSIS — R4781 Slurred speech: Secondary | ICD-10-CM | POA: Diagnosis not present

## 2020-09-16 DIAGNOSIS — E1121 Type 2 diabetes mellitus with diabetic nephropathy: Secondary | ICD-10-CM | POA: Diagnosis not present

## 2020-09-16 DIAGNOSIS — I4821 Permanent atrial fibrillation: Secondary | ICD-10-CM | POA: Diagnosis not present

## 2020-09-16 DIAGNOSIS — Z743 Need for continuous supervision: Secondary | ICD-10-CM | POA: Diagnosis not present

## 2020-09-16 DIAGNOSIS — E8770 Fluid overload, unspecified: Secondary | ICD-10-CM | POA: Diagnosis not present

## 2020-09-16 DIAGNOSIS — S42201A Unspecified fracture of upper end of right humerus, initial encounter for closed fracture: Secondary | ICD-10-CM | POA: Diagnosis not present

## 2020-09-16 DIAGNOSIS — I2510 Atherosclerotic heart disease of native coronary artery without angina pectoris: Secondary | ICD-10-CM | POA: Diagnosis not present

## 2020-09-16 DIAGNOSIS — R69 Illness, unspecified: Secondary | ICD-10-CM | POA: Diagnosis not present

## 2020-09-16 DIAGNOSIS — R509 Fever, unspecified: Secondary | ICD-10-CM | POA: Diagnosis not present

## 2020-09-16 DIAGNOSIS — E875 Hyperkalemia: Secondary | ICD-10-CM | POA: Diagnosis not present

## 2020-09-16 DIAGNOSIS — Z933 Colostomy status: Secondary | ICD-10-CM | POA: Diagnosis not present

## 2020-09-16 DIAGNOSIS — R29810 Facial weakness: Secondary | ICD-10-CM | POA: Diagnosis not present

## 2020-09-16 DIAGNOSIS — E870 Hyperosmolality and hypernatremia: Secondary | ICD-10-CM | POA: Diagnosis not present

## 2020-09-16 DIAGNOSIS — K5900 Constipation, unspecified: Secondary | ICD-10-CM | POA: Diagnosis not present

## 2020-09-16 DIAGNOSIS — Z9181 History of falling: Secondary | ICD-10-CM | POA: Diagnosis not present

## 2020-09-16 DIAGNOSIS — Z7401 Bed confinement status: Secondary | ICD-10-CM | POA: Diagnosis not present

## 2020-09-16 DIAGNOSIS — I6523 Occlusion and stenosis of bilateral carotid arteries: Secondary | ICD-10-CM | POA: Diagnosis not present

## 2020-09-16 DIAGNOSIS — Z20822 Contact with and (suspected) exposure to covid-19: Secondary | ICD-10-CM | POA: Diagnosis not present

## 2020-09-16 DIAGNOSIS — J449 Chronic obstructive pulmonary disease, unspecified: Secondary | ICD-10-CM | POA: Diagnosis not present

## 2020-09-16 DIAGNOSIS — I700 Atherosclerosis of aorta: Secondary | ICD-10-CM | POA: Diagnosis not present

## 2020-09-16 DIAGNOSIS — R531 Weakness: Secondary | ICD-10-CM | POA: Diagnosis not present

## 2020-09-16 DIAGNOSIS — S42351D Displaced comminuted fracture of shaft of humerus, right arm, subsequent encounter for fracture with routine healing: Secondary | ICD-10-CM | POA: Diagnosis not present

## 2020-09-16 DIAGNOSIS — R29898 Other symptoms and signs involving the musculoskeletal system: Secondary | ICD-10-CM | POA: Diagnosis not present

## 2020-09-16 DIAGNOSIS — G319 Degenerative disease of nervous system, unspecified: Secondary | ICD-10-CM | POA: Diagnosis not present

## 2020-09-16 DIAGNOSIS — I110 Hypertensive heart disease with heart failure: Secondary | ICD-10-CM | POA: Diagnosis not present

## 2020-09-16 DIAGNOSIS — Z79899 Other long term (current) drug therapy: Secondary | ICD-10-CM | POA: Diagnosis not present

## 2020-09-16 DIAGNOSIS — R404 Transient alteration of awareness: Secondary | ICD-10-CM | POA: Diagnosis not present

## 2020-09-16 DIAGNOSIS — S42301D Unspecified fracture of shaft of humerus, right arm, subsequent encounter for fracture with routine healing: Secondary | ICD-10-CM | POA: Diagnosis not present

## 2020-09-16 DIAGNOSIS — E1122 Type 2 diabetes mellitus with diabetic chronic kidney disease: Secondary | ICD-10-CM | POA: Diagnosis not present

## 2020-09-16 DIAGNOSIS — I69322 Dysarthria following cerebral infarction: Secondary | ICD-10-CM | POA: Diagnosis not present

## 2020-09-16 DIAGNOSIS — E1151 Type 2 diabetes mellitus with diabetic peripheral angiopathy without gangrene: Secondary | ICD-10-CM | POA: Diagnosis not present

## 2020-09-16 DIAGNOSIS — Z931 Gastrostomy status: Secondary | ICD-10-CM | POA: Diagnosis not present

## 2020-09-16 DIAGNOSIS — S52591D Other fractures of lower end of right radius, subsequent encounter for closed fracture with routine healing: Secondary | ICD-10-CM | POA: Diagnosis not present

## 2020-09-16 DIAGNOSIS — S52601D Unspecified fracture of lower end of right ulna, subsequent encounter for closed fracture with routine healing: Secondary | ICD-10-CM | POA: Diagnosis not present

## 2020-09-16 DIAGNOSIS — S5291XA Unspecified fracture of right forearm, initial encounter for closed fracture: Secondary | ICD-10-CM | POA: Diagnosis not present

## 2020-09-16 DIAGNOSIS — I672 Cerebral atherosclerosis: Secondary | ICD-10-CM | POA: Diagnosis not present

## 2020-09-16 DIAGNOSIS — I4891 Unspecified atrial fibrillation: Secondary | ICD-10-CM | POA: Diagnosis not present

## 2020-09-16 DIAGNOSIS — I69315 Cognitive social or emotional deficit following cerebral infarction: Secondary | ICD-10-CM | POA: Diagnosis not present

## 2020-09-16 DIAGNOSIS — Z4682 Encounter for fitting and adjustment of non-vascular catheter: Secondary | ICD-10-CM | POA: Diagnosis not present

## 2020-09-16 DIAGNOSIS — R6889 Other general symptoms and signs: Secondary | ICD-10-CM | POA: Diagnosis not present

## 2020-09-16 DIAGNOSIS — I63233 Cerebral infarction due to unspecified occlusion or stenosis of bilateral carotid arteries: Secondary | ICD-10-CM | POA: Diagnosis not present

## 2020-09-16 DIAGNOSIS — N390 Urinary tract infection, site not specified: Secondary | ICD-10-CM | POA: Diagnosis not present

## 2020-09-16 DIAGNOSIS — I739 Peripheral vascular disease, unspecified: Secondary | ICD-10-CM | POA: Diagnosis not present

## 2020-09-16 DIAGNOSIS — Z87891 Personal history of nicotine dependence: Secondary | ICD-10-CM | POA: Diagnosis not present

## 2020-09-16 DIAGNOSIS — Z7982 Long term (current) use of aspirin: Secondary | ICD-10-CM | POA: Diagnosis not present

## 2020-09-16 DIAGNOSIS — I6782 Cerebral ischemia: Secondary | ICD-10-CM | POA: Diagnosis not present

## 2020-09-16 DIAGNOSIS — M6281 Muscle weakness (generalized): Secondary | ICD-10-CM | POA: Diagnosis not present

## 2020-09-16 DIAGNOSIS — N184 Chronic kidney disease, stage 4 (severe): Secondary | ICD-10-CM | POA: Diagnosis not present

## 2020-09-19 DIAGNOSIS — G319 Degenerative disease of nervous system, unspecified: Secondary | ICD-10-CM | POA: Diagnosis not present

## 2020-09-19 DIAGNOSIS — Z4682 Encounter for fitting and adjustment of non-vascular catheter: Secondary | ICD-10-CM | POA: Diagnosis not present

## 2020-09-19 DIAGNOSIS — R55 Syncope and collapse: Secondary | ICD-10-CM | POA: Diagnosis not present

## 2020-09-20 ENCOUNTER — Ambulatory Visit: Payer: Medicare Other | Admitting: Orthopedic Surgery

## 2020-09-20 ENCOUNTER — Telehealth: Payer: Self-pay | Admitting: Orthopedic Surgery

## 2020-09-20 NOTE — Telephone Encounter (Signed)
Done

## 2020-09-20 NOTE — Telephone Encounter (Signed)
Patient's daughter Jenny Reichmann called to let us know pt is in [facility] is reason she missed appt. Will call back to reschedule.

## 2020-09-21 ENCOUNTER — Ambulatory Visit: Payer: Medicare Other | Admitting: Orthopedic Surgery

## 2020-09-21 DIAGNOSIS — I63233 Cerebral infarction due to unspecified occlusion or stenosis of bilateral carotid arteries: Secondary | ICD-10-CM | POA: Diagnosis not present

## 2020-09-21 DIAGNOSIS — I69391 Dysphagia following cerebral infarction: Secondary | ICD-10-CM | POA: Diagnosis not present

## 2020-09-21 DIAGNOSIS — I517 Cardiomegaly: Secondary | ICD-10-CM | POA: Diagnosis not present

## 2020-09-22 DIAGNOSIS — R1310 Dysphagia, unspecified: Secondary | ICD-10-CM | POA: Diagnosis not present

## 2020-09-22 DIAGNOSIS — I5032 Chronic diastolic (congestive) heart failure: Secondary | ICD-10-CM | POA: Diagnosis not present

## 2020-09-23 DIAGNOSIS — I639 Cerebral infarction, unspecified: Secondary | ICD-10-CM | POA: Diagnosis not present

## 2020-09-23 DIAGNOSIS — S52591D Other fractures of lower end of right radius, subsequent encounter for closed fracture with routine healing: Secondary | ICD-10-CM | POA: Diagnosis not present

## 2020-09-23 DIAGNOSIS — S52691D Other fracture of lower end of right ulna, subsequent encounter for closed fracture with routine healing: Secondary | ICD-10-CM | POA: Diagnosis not present

## 2020-09-23 DIAGNOSIS — E875 Hyperkalemia: Secondary | ICD-10-CM | POA: Diagnosis not present

## 2020-09-23 DIAGNOSIS — J441 Chronic obstructive pulmonary disease with (acute) exacerbation: Secondary | ICD-10-CM | POA: Diagnosis not present

## 2020-09-23 DIAGNOSIS — S42201A Unspecified fracture of upper end of right humerus, initial encounter for closed fracture: Secondary | ICD-10-CM | POA: Diagnosis not present

## 2020-09-23 DIAGNOSIS — S42301A Unspecified fracture of shaft of humerus, right arm, initial encounter for closed fracture: Secondary | ICD-10-CM | POA: Diagnosis not present

## 2020-09-24 DIAGNOSIS — N390 Urinary tract infection, site not specified: Secondary | ICD-10-CM | POA: Diagnosis not present

## 2020-09-24 DIAGNOSIS — Z931 Gastrostomy status: Secondary | ICD-10-CM | POA: Diagnosis not present

## 2020-09-24 DIAGNOSIS — E8770 Fluid overload, unspecified: Secondary | ICD-10-CM | POA: Diagnosis not present

## 2020-09-24 DIAGNOSIS — I63511 Cerebral infarction due to unspecified occlusion or stenosis of right middle cerebral artery: Secondary | ICD-10-CM | POA: Diagnosis not present

## 2020-09-24 DIAGNOSIS — S42301A Unspecified fracture of shaft of humerus, right arm, initial encounter for closed fracture: Secondary | ICD-10-CM | POA: Diagnosis not present

## 2020-09-24 DIAGNOSIS — S5291XA Unspecified fracture of right forearm, initial encounter for closed fracture: Secondary | ICD-10-CM | POA: Diagnosis not present

## 2020-09-24 DIAGNOSIS — I69391 Dysphagia following cerebral infarction: Secondary | ICD-10-CM | POA: Diagnosis not present

## 2020-09-24 DIAGNOSIS — I4821 Permanent atrial fibrillation: Secondary | ICD-10-CM | POA: Diagnosis not present

## 2020-09-24 DIAGNOSIS — I739 Peripheral vascular disease, unspecified: Secondary | ICD-10-CM | POA: Diagnosis not present

## 2020-09-24 DIAGNOSIS — I2510 Atherosclerotic heart disease of native coronary artery without angina pectoris: Secondary | ICD-10-CM | POA: Diagnosis not present

## 2020-09-24 DIAGNOSIS — N184 Chronic kidney disease, stage 4 (severe): Secondary | ICD-10-CM | POA: Diagnosis not present

## 2020-09-24 DIAGNOSIS — I5032 Chronic diastolic (congestive) heart failure: Secondary | ICD-10-CM | POA: Diagnosis not present

## 2020-09-24 DIAGNOSIS — D649 Anemia, unspecified: Secondary | ICD-10-CM | POA: Diagnosis not present

## 2020-09-25 DIAGNOSIS — N184 Chronic kidney disease, stage 4 (severe): Secondary | ICD-10-CM | POA: Diagnosis not present

## 2020-09-25 DIAGNOSIS — E1165 Type 2 diabetes mellitus with hyperglycemia: Secondary | ICD-10-CM | POA: Diagnosis not present

## 2020-09-25 DIAGNOSIS — J449 Chronic obstructive pulmonary disease, unspecified: Secondary | ICD-10-CM | POA: Diagnosis not present

## 2020-09-25 DIAGNOSIS — S42301A Unspecified fracture of shaft of humerus, right arm, initial encounter for closed fracture: Secondary | ICD-10-CM | POA: Diagnosis not present

## 2020-09-25 DIAGNOSIS — I482 Chronic atrial fibrillation, unspecified: Secondary | ICD-10-CM | POA: Diagnosis not present

## 2020-09-25 DIAGNOSIS — N390 Urinary tract infection, site not specified: Secondary | ICD-10-CM | POA: Diagnosis not present

## 2020-09-25 DIAGNOSIS — I2510 Atherosclerotic heart disease of native coronary artery without angina pectoris: Secondary | ICD-10-CM | POA: Diagnosis not present

## 2020-09-25 DIAGNOSIS — D649 Anemia, unspecified: Secondary | ICD-10-CM | POA: Diagnosis not present

## 2020-09-25 DIAGNOSIS — S5291XA Unspecified fracture of right forearm, initial encounter for closed fracture: Secondary | ICD-10-CM | POA: Diagnosis not present

## 2020-09-25 DIAGNOSIS — M1710 Unilateral primary osteoarthritis, unspecified knee: Secondary | ICD-10-CM | POA: Diagnosis not present

## 2020-09-25 DIAGNOSIS — I63511 Cerebral infarction due to unspecified occlusion or stenosis of right middle cerebral artery: Secondary | ICD-10-CM | POA: Diagnosis not present

## 2020-09-25 DIAGNOSIS — I5032 Chronic diastolic (congestive) heart failure: Secondary | ICD-10-CM | POA: Diagnosis not present

## 2020-09-25 DIAGNOSIS — E8770 Fluid overload, unspecified: Secondary | ICD-10-CM | POA: Diagnosis not present

## 2020-09-25 DIAGNOSIS — K219 Gastro-esophageal reflux disease without esophagitis: Secondary | ICD-10-CM | POA: Diagnosis not present

## 2020-09-25 DIAGNOSIS — Z79899 Other long term (current) drug therapy: Secondary | ICD-10-CM | POA: Diagnosis not present

## 2020-09-25 DIAGNOSIS — Z931 Gastrostomy status: Secondary | ICD-10-CM | POA: Diagnosis not present

## 2020-09-26 DIAGNOSIS — I639 Cerebral infarction, unspecified: Secondary | ICD-10-CM | POA: Diagnosis not present

## 2020-09-26 DIAGNOSIS — E875 Hyperkalemia: Secondary | ICD-10-CM | POA: Diagnosis not present

## 2020-09-26 DIAGNOSIS — Z933 Colostomy status: Secondary | ICD-10-CM | POA: Diagnosis not present

## 2020-09-28 DIAGNOSIS — R0602 Shortness of breath: Secondary | ICD-10-CM | POA: Diagnosis not present

## 2020-09-28 DIAGNOSIS — I517 Cardiomegaly: Secondary | ICD-10-CM | POA: Diagnosis not present

## 2020-09-28 DIAGNOSIS — I509 Heart failure, unspecified: Secondary | ICD-10-CM | POA: Diagnosis not present

## 2020-09-30 DIAGNOSIS — I517 Cardiomegaly: Secondary | ICD-10-CM | POA: Diagnosis not present

## 2020-09-30 DIAGNOSIS — R509 Fever, unspecified: Secondary | ICD-10-CM | POA: Diagnosis not present

## 2020-10-02 DIAGNOSIS — K5900 Constipation, unspecified: Secondary | ICD-10-CM | POA: Diagnosis not present

## 2020-10-04 DIAGNOSIS — J9601 Acute respiratory failure with hypoxia: Secondary | ICD-10-CM | POA: Diagnosis not present

## 2020-10-04 DIAGNOSIS — I63511 Cerebral infarction due to unspecified occlusion or stenosis of right middle cerebral artery: Secondary | ICD-10-CM | POA: Diagnosis not present

## 2020-10-04 DIAGNOSIS — E1151 Type 2 diabetes mellitus with diabetic peripheral angiopathy without gangrene: Secondary | ICD-10-CM | POA: Diagnosis not present

## 2020-10-04 DIAGNOSIS — Z7984 Long term (current) use of oral hypoglycemic drugs: Secondary | ICD-10-CM | POA: Diagnosis not present

## 2020-10-04 DIAGNOSIS — R404 Transient alteration of awareness: Secondary | ICD-10-CM | POA: Diagnosis not present

## 2020-10-04 DIAGNOSIS — I482 Chronic atrial fibrillation, unspecified: Secondary | ICD-10-CM | POA: Diagnosis not present

## 2020-10-04 DIAGNOSIS — S52501D Unspecified fracture of the lower end of right radius, subsequent encounter for closed fracture with routine healing: Secondary | ICD-10-CM | POA: Diagnosis not present

## 2020-10-04 DIAGNOSIS — R69 Illness, unspecified: Secondary | ICD-10-CM | POA: Diagnosis not present

## 2020-10-04 DIAGNOSIS — I69391 Dysphagia following cerebral infarction: Secondary | ICD-10-CM | POA: Diagnosis not present

## 2020-10-04 DIAGNOSIS — I5022 Chronic systolic (congestive) heart failure: Secondary | ICD-10-CM | POA: Diagnosis not present

## 2020-10-04 DIAGNOSIS — Z515 Encounter for palliative care: Secondary | ICD-10-CM | POA: Diagnosis not present

## 2020-10-04 DIAGNOSIS — J441 Chronic obstructive pulmonary disease with (acute) exacerbation: Secondary | ICD-10-CM | POA: Diagnosis not present

## 2020-10-04 DIAGNOSIS — R531 Weakness: Secondary | ICD-10-CM | POA: Diagnosis not present

## 2020-10-04 DIAGNOSIS — R040 Epistaxis: Secondary | ICD-10-CM | POA: Diagnosis not present

## 2020-10-04 DIAGNOSIS — M6281 Muscle weakness (generalized): Secondary | ICD-10-CM | POA: Diagnosis not present

## 2020-10-04 DIAGNOSIS — Z79899 Other long term (current) drug therapy: Secondary | ICD-10-CM | POA: Diagnosis not present

## 2020-10-04 DIAGNOSIS — S52601D Unspecified fracture of lower end of right ulna, subsequent encounter for closed fracture with routine healing: Secondary | ICD-10-CM | POA: Diagnosis not present

## 2020-10-04 DIAGNOSIS — R2689 Other abnormalities of gait and mobility: Secondary | ICD-10-CM | POA: Diagnosis not present

## 2020-10-04 DIAGNOSIS — R509 Fever, unspecified: Secondary | ICD-10-CM | POA: Diagnosis not present

## 2020-10-04 DIAGNOSIS — E875 Hyperkalemia: Secondary | ICD-10-CM | POA: Diagnosis not present

## 2020-10-04 DIAGNOSIS — I10 Essential (primary) hypertension: Secondary | ICD-10-CM | POA: Diagnosis not present

## 2020-10-04 DIAGNOSIS — Z931 Gastrostomy status: Secondary | ICD-10-CM | POA: Diagnosis not present

## 2020-10-04 DIAGNOSIS — R0689 Other abnormalities of breathing: Secondary | ICD-10-CM | POA: Diagnosis not present

## 2020-10-04 DIAGNOSIS — I69354 Hemiplegia and hemiparesis following cerebral infarction affecting left non-dominant side: Secondary | ICD-10-CM | POA: Diagnosis not present

## 2020-10-04 DIAGNOSIS — J690 Pneumonitis due to inhalation of food and vomit: Secondary | ICD-10-CM | POA: Diagnosis not present

## 2020-10-04 DIAGNOSIS — S42351D Displaced comminuted fracture of shaft of humerus, right arm, subsequent encounter for fracture with routine healing: Secondary | ICD-10-CM | POA: Diagnosis not present

## 2020-10-04 DIAGNOSIS — E1122 Type 2 diabetes mellitus with diabetic chronic kidney disease: Secondary | ICD-10-CM | POA: Diagnosis not present

## 2020-10-04 DIAGNOSIS — E870 Hyperosmolality and hypernatremia: Secondary | ICD-10-CM | POA: Diagnosis not present

## 2020-10-04 DIAGNOSIS — E1121 Type 2 diabetes mellitus with diabetic nephropathy: Secondary | ICD-10-CM | POA: Diagnosis not present

## 2020-10-04 DIAGNOSIS — I739 Peripheral vascular disease, unspecified: Secondary | ICD-10-CM | POA: Diagnosis not present

## 2020-10-04 DIAGNOSIS — Z9181 History of falling: Secondary | ICD-10-CM | POA: Diagnosis not present

## 2020-10-04 DIAGNOSIS — Z87891 Personal history of nicotine dependence: Secondary | ICD-10-CM | POA: Diagnosis not present

## 2020-10-04 DIAGNOSIS — Z20822 Contact with and (suspected) exposure to covid-19: Secondary | ICD-10-CM | POA: Diagnosis not present

## 2020-10-04 DIAGNOSIS — I517 Cardiomegaly: Secondary | ICD-10-CM | POA: Diagnosis not present

## 2020-10-04 DIAGNOSIS — R6889 Other general symptoms and signs: Secondary | ICD-10-CM | POA: Diagnosis not present

## 2020-10-04 DIAGNOSIS — D62 Acute posthemorrhagic anemia: Secondary | ICD-10-CM | POA: Diagnosis not present

## 2020-10-04 DIAGNOSIS — I4819 Other persistent atrial fibrillation: Secondary | ICD-10-CM | POA: Diagnosis not present

## 2020-10-04 DIAGNOSIS — R0600 Dyspnea, unspecified: Secondary | ICD-10-CM | POA: Diagnosis not present

## 2020-10-04 DIAGNOSIS — I110 Hypertensive heart disease with heart failure: Secondary | ICD-10-CM | POA: Diagnosis not present

## 2020-10-04 DIAGNOSIS — I130 Hypertensive heart and chronic kidney disease with heart failure and stage 1 through stage 4 chronic kidney disease, or unspecified chronic kidney disease: Secondary | ICD-10-CM | POA: Diagnosis not present

## 2020-10-04 DIAGNOSIS — I639 Cerebral infarction, unspecified: Secondary | ICD-10-CM | POA: Diagnosis not present

## 2020-10-04 DIAGNOSIS — Z7401 Bed confinement status: Secondary | ICD-10-CM | POA: Diagnosis not present

## 2020-10-04 DIAGNOSIS — I5032 Chronic diastolic (congestive) heart failure: Secondary | ICD-10-CM | POA: Diagnosis not present

## 2020-10-04 DIAGNOSIS — Z66 Do not resuscitate: Secondary | ICD-10-CM | POA: Diagnosis not present

## 2020-10-04 DIAGNOSIS — I4821 Permanent atrial fibrillation: Secondary | ICD-10-CM | POA: Diagnosis not present

## 2020-10-04 DIAGNOSIS — I69315 Cognitive social or emotional deficit following cerebral infarction: Secondary | ICD-10-CM | POA: Diagnosis not present

## 2020-10-04 DIAGNOSIS — N1832 Chronic kidney disease, stage 3b: Secondary | ICD-10-CM | POA: Diagnosis not present

## 2020-10-04 DIAGNOSIS — J449 Chronic obstructive pulmonary disease, unspecified: Secondary | ICD-10-CM | POA: Diagnosis not present

## 2020-10-04 DIAGNOSIS — K921 Melena: Secondary | ICD-10-CM | POA: Diagnosis not present

## 2020-10-06 DIAGNOSIS — I5022 Chronic systolic (congestive) heart failure: Secondary | ICD-10-CM | POA: Diagnosis not present

## 2020-10-06 DIAGNOSIS — I4819 Other persistent atrial fibrillation: Secondary | ICD-10-CM | POA: Diagnosis not present

## 2020-10-06 DIAGNOSIS — I10 Essential (primary) hypertension: Secondary | ICD-10-CM | POA: Diagnosis not present

## 2020-10-06 DIAGNOSIS — J441 Chronic obstructive pulmonary disease with (acute) exacerbation: Secondary | ICD-10-CM | POA: Diagnosis not present

## 2020-10-06 DIAGNOSIS — I639 Cerebral infarction, unspecified: Secondary | ICD-10-CM | POA: Diagnosis not present

## 2020-10-08 DIAGNOSIS — R040 Epistaxis: Secondary | ICD-10-CM | POA: Diagnosis not present

## 2020-10-08 DIAGNOSIS — K5730 Diverticulosis of large intestine without perforation or abscess without bleeding: Secondary | ICD-10-CM | POA: Diagnosis not present

## 2020-10-08 DIAGNOSIS — N1832 Chronic kidney disease, stage 3b: Secondary | ICD-10-CM | POA: Diagnosis not present

## 2020-10-08 DIAGNOSIS — R6889 Other general symptoms and signs: Secondary | ICD-10-CM | POA: Diagnosis not present

## 2020-10-08 DIAGNOSIS — I69354 Hemiplegia and hemiparesis following cerebral infarction affecting left non-dominant side: Secondary | ICD-10-CM | POA: Diagnosis not present

## 2020-10-08 DIAGNOSIS — I482 Chronic atrial fibrillation, unspecified: Secondary | ICD-10-CM | POA: Diagnosis not present

## 2020-10-08 DIAGNOSIS — I110 Hypertensive heart disease with heart failure: Secondary | ICD-10-CM | POA: Diagnosis not present

## 2020-10-08 DIAGNOSIS — J449 Chronic obstructive pulmonary disease, unspecified: Secondary | ICD-10-CM | POA: Diagnosis not present

## 2020-10-08 DIAGNOSIS — J9690 Respiratory failure, unspecified, unspecified whether with hypoxia or hypercapnia: Secondary | ICD-10-CM | POA: Diagnosis not present

## 2020-10-08 DIAGNOSIS — I739 Peripheral vascular disease, unspecified: Secondary | ICD-10-CM | POA: Diagnosis not present

## 2020-10-08 DIAGNOSIS — E870 Hyperosmolality and hypernatremia: Secondary | ICD-10-CM | POA: Diagnosis not present

## 2020-10-08 DIAGNOSIS — I69391 Dysphagia following cerebral infarction: Secondary | ICD-10-CM | POA: Diagnosis not present

## 2020-10-08 DIAGNOSIS — Z7984 Long term (current) use of oral hypoglycemic drugs: Secondary | ICD-10-CM | POA: Diagnosis not present

## 2020-10-08 DIAGNOSIS — Z743 Need for continuous supervision: Secondary | ICD-10-CM | POA: Diagnosis not present

## 2020-10-08 DIAGNOSIS — Z20822 Contact with and (suspected) exposure to covid-19: Secondary | ICD-10-CM | POA: Diagnosis not present

## 2020-10-08 DIAGNOSIS — Z79899 Other long term (current) drug therapy: Secondary | ICD-10-CM | POA: Diagnosis not present

## 2020-10-08 DIAGNOSIS — K921 Melena: Secondary | ICD-10-CM | POA: Diagnosis not present

## 2020-10-08 DIAGNOSIS — I5032 Chronic diastolic (congestive) heart failure: Secondary | ICD-10-CM | POA: Diagnosis not present

## 2020-10-08 DIAGNOSIS — J690 Pneumonitis due to inhalation of food and vomit: Secondary | ICD-10-CM | POA: Diagnosis not present

## 2020-10-08 DIAGNOSIS — R404 Transient alteration of awareness: Secondary | ICD-10-CM | POA: Diagnosis not present

## 2020-10-08 DIAGNOSIS — Z515 Encounter for palliative care: Secondary | ICD-10-CM | POA: Diagnosis not present

## 2020-10-08 DIAGNOSIS — E1122 Type 2 diabetes mellitus with diabetic chronic kidney disease: Secondary | ICD-10-CM | POA: Diagnosis not present

## 2020-10-08 DIAGNOSIS — Z66 Do not resuscitate: Secondary | ICD-10-CM | POA: Diagnosis not present

## 2020-10-08 DIAGNOSIS — J9601 Acute respiratory failure with hypoxia: Secondary | ICD-10-CM | POA: Diagnosis not present

## 2020-10-08 DIAGNOSIS — R509 Fever, unspecified: Secondary | ICD-10-CM | POA: Diagnosis not present

## 2020-10-08 DIAGNOSIS — R092 Respiratory arrest: Secondary | ICD-10-CM | POA: Diagnosis not present

## 2020-10-08 DIAGNOSIS — E1151 Type 2 diabetes mellitus with diabetic peripheral angiopathy without gangrene: Secondary | ICD-10-CM | POA: Diagnosis not present

## 2020-10-08 DIAGNOSIS — R0600 Dyspnea, unspecified: Secondary | ICD-10-CM | POA: Diagnosis not present

## 2020-10-08 DIAGNOSIS — I10 Essential (primary) hypertension: Secondary | ICD-10-CM | POA: Diagnosis not present

## 2020-10-08 DIAGNOSIS — Z87891 Personal history of nicotine dependence: Secondary | ICD-10-CM | POA: Diagnosis not present

## 2020-10-08 DIAGNOSIS — I517 Cardiomegaly: Secondary | ICD-10-CM | POA: Diagnosis not present

## 2020-10-08 DIAGNOSIS — I4821 Permanent atrial fibrillation: Secondary | ICD-10-CM | POA: Diagnosis not present

## 2020-10-08 DIAGNOSIS — I130 Hypertensive heart and chronic kidney disease with heart failure and stage 1 through stage 4 chronic kidney disease, or unspecified chronic kidney disease: Secondary | ICD-10-CM | POA: Diagnosis not present

## 2020-10-08 DIAGNOSIS — D62 Acute posthemorrhagic anemia: Secondary | ICD-10-CM | POA: Diagnosis not present

## 2020-10-08 DIAGNOSIS — I639 Cerebral infarction, unspecified: Secondary | ICD-10-CM | POA: Diagnosis not present

## 2020-10-08 DIAGNOSIS — N281 Cyst of kidney, acquired: Secondary | ICD-10-CM | POA: Diagnosis not present

## 2020-10-08 DIAGNOSIS — R0689 Other abnormalities of breathing: Secondary | ICD-10-CM | POA: Diagnosis not present

## 2020-10-09 DIAGNOSIS — Z79899 Other long term (current) drug therapy: Secondary | ICD-10-CM | POA: Diagnosis not present

## 2020-10-09 DIAGNOSIS — I4821 Permanent atrial fibrillation: Secondary | ICD-10-CM | POA: Diagnosis not present

## 2020-10-09 DIAGNOSIS — I5032 Chronic diastolic (congestive) heart failure: Secondary | ICD-10-CM | POA: Diagnosis not present

## 2020-10-09 DIAGNOSIS — I739 Peripheral vascular disease, unspecified: Secondary | ICD-10-CM | POA: Diagnosis not present

## 2020-10-09 DIAGNOSIS — I639 Cerebral infarction, unspecified: Secondary | ICD-10-CM | POA: Diagnosis not present

## 2020-10-09 DIAGNOSIS — J690 Pneumonitis due to inhalation of food and vomit: Secondary | ICD-10-CM | POA: Diagnosis not present

## 2020-10-09 DIAGNOSIS — I110 Hypertensive heart disease with heart failure: Secondary | ICD-10-CM | POA: Diagnosis not present

## 2020-10-10 DIAGNOSIS — I482 Chronic atrial fibrillation, unspecified: Secondary | ICD-10-CM | POA: Diagnosis not present

## 2020-10-10 DIAGNOSIS — E870 Hyperosmolality and hypernatremia: Secondary | ICD-10-CM | POA: Diagnosis not present

## 2020-10-10 DIAGNOSIS — Z66 Do not resuscitate: Secondary | ICD-10-CM | POA: Diagnosis not present

## 2020-10-10 DIAGNOSIS — J9601 Acute respiratory failure with hypoxia: Secondary | ICD-10-CM | POA: Diagnosis not present

## 2020-10-11 DIAGNOSIS — J9601 Acute respiratory failure with hypoxia: Secondary | ICD-10-CM | POA: Diagnosis not present

## 2020-10-11 DIAGNOSIS — E870 Hyperosmolality and hypernatremia: Secondary | ICD-10-CM | POA: Diagnosis not present

## 2020-10-11 DIAGNOSIS — Z66 Do not resuscitate: Secondary | ICD-10-CM | POA: Diagnosis not present

## 2020-10-11 DIAGNOSIS — I482 Chronic atrial fibrillation, unspecified: Secondary | ICD-10-CM | POA: Diagnosis not present

## 2020-10-12 DIAGNOSIS — K5730 Diverticulosis of large intestine without perforation or abscess without bleeding: Secondary | ICD-10-CM | POA: Diagnosis not present

## 2020-10-12 DIAGNOSIS — Z66 Do not resuscitate: Secondary | ICD-10-CM | POA: Diagnosis not present

## 2020-10-12 DIAGNOSIS — J9601 Acute respiratory failure with hypoxia: Secondary | ICD-10-CM | POA: Diagnosis not present

## 2020-10-12 DIAGNOSIS — I482 Chronic atrial fibrillation, unspecified: Secondary | ICD-10-CM | POA: Diagnosis not present

## 2020-10-12 DIAGNOSIS — E870 Hyperosmolality and hypernatremia: Secondary | ICD-10-CM | POA: Diagnosis not present

## 2020-10-12 DIAGNOSIS — N281 Cyst of kidney, acquired: Secondary | ICD-10-CM | POA: Diagnosis not present

## 2020-10-13 DIAGNOSIS — Z66 Do not resuscitate: Secondary | ICD-10-CM | POA: Diagnosis not present

## 2020-10-13 DIAGNOSIS — I482 Chronic atrial fibrillation, unspecified: Secondary | ICD-10-CM | POA: Diagnosis not present

## 2020-10-13 DIAGNOSIS — E870 Hyperosmolality and hypernatremia: Secondary | ICD-10-CM | POA: Diagnosis not present

## 2020-10-13 DIAGNOSIS — J9601 Acute respiratory failure with hypoxia: Secondary | ICD-10-CM | POA: Diagnosis not present

## 2020-10-14 DIAGNOSIS — J9601 Acute respiratory failure with hypoxia: Secondary | ICD-10-CM | POA: Diagnosis not present

## 2020-10-14 DIAGNOSIS — I482 Chronic atrial fibrillation, unspecified: Secondary | ICD-10-CM | POA: Diagnosis not present

## 2020-10-14 DIAGNOSIS — Z66 Do not resuscitate: Secondary | ICD-10-CM | POA: Diagnosis not present

## 2020-10-14 DIAGNOSIS — E870 Hyperosmolality and hypernatremia: Secondary | ICD-10-CM | POA: Diagnosis not present

## 2020-10-15 DIAGNOSIS — I4821 Permanent atrial fibrillation: Secondary | ICD-10-CM | POA: Diagnosis not present

## 2020-10-15 DIAGNOSIS — I739 Peripheral vascular disease, unspecified: Secondary | ICD-10-CM | POA: Diagnosis not present

## 2020-10-15 DIAGNOSIS — J690 Pneumonitis due to inhalation of food and vomit: Secondary | ICD-10-CM | POA: Diagnosis not present

## 2020-10-15 DIAGNOSIS — I639 Cerebral infarction, unspecified: Secondary | ICD-10-CM | POA: Diagnosis not present

## 2020-10-15 DIAGNOSIS — I110 Hypertensive heart disease with heart failure: Secondary | ICD-10-CM | POA: Diagnosis not present

## 2020-10-15 DIAGNOSIS — I5032 Chronic diastolic (congestive) heart failure: Secondary | ICD-10-CM | POA: Diagnosis not present

## 2020-10-16 DIAGNOSIS — I739 Peripheral vascular disease, unspecified: Secondary | ICD-10-CM | POA: Diagnosis not present

## 2020-10-16 DIAGNOSIS — I110 Hypertensive heart disease with heart failure: Secondary | ICD-10-CM | POA: Diagnosis not present

## 2020-10-16 DIAGNOSIS — J690 Pneumonitis due to inhalation of food and vomit: Secondary | ICD-10-CM | POA: Diagnosis not present

## 2020-10-16 DIAGNOSIS — I5032 Chronic diastolic (congestive) heart failure: Secondary | ICD-10-CM | POA: Diagnosis not present

## 2020-10-16 DIAGNOSIS — I4821 Permanent atrial fibrillation: Secondary | ICD-10-CM | POA: Diagnosis not present

## 2020-10-16 DIAGNOSIS — I639 Cerebral infarction, unspecified: Secondary | ICD-10-CM | POA: Diagnosis not present

## 2020-10-17 DIAGNOSIS — E870 Hyperosmolality and hypernatremia: Secondary | ICD-10-CM | POA: Diagnosis not present

## 2020-10-17 DIAGNOSIS — Z66 Do not resuscitate: Secondary | ICD-10-CM | POA: Diagnosis not present

## 2020-10-17 DIAGNOSIS — I482 Chronic atrial fibrillation, unspecified: Secondary | ICD-10-CM | POA: Diagnosis not present

## 2020-10-17 DIAGNOSIS — J9601 Acute respiratory failure with hypoxia: Secondary | ICD-10-CM | POA: Diagnosis not present

## 2020-10-18 DIAGNOSIS — R509 Fever, unspecified: Secondary | ICD-10-CM | POA: Diagnosis not present

## 2020-10-18 DIAGNOSIS — R404 Transient alteration of awareness: Secondary | ICD-10-CM | POA: Diagnosis not present

## 2020-10-18 DIAGNOSIS — R092 Respiratory arrest: Secondary | ICD-10-CM | POA: Diagnosis not present

## 2020-10-18 DIAGNOSIS — Z743 Need for continuous supervision: Secondary | ICD-10-CM | POA: Diagnosis not present

## 2020-10-18 DIAGNOSIS — J9690 Respiratory failure, unspecified, unspecified whether with hypoxia or hypercapnia: Secondary | ICD-10-CM | POA: Diagnosis not present

## 2020-10-27 DEATH — deceased

## 2020-11-24 IMAGING — US US RENAL
1 series · 14 of 25 positions shown · non-contrast
Comparison: None.

CLINICAL DATA: Chronic kidney disease stage IIIb

EXAM:
RENAL / URINARY TRACT ULTRASOUND COMPLETE

[Series 1: us renal · 14 of 40 slices shown]
[im 1/40]
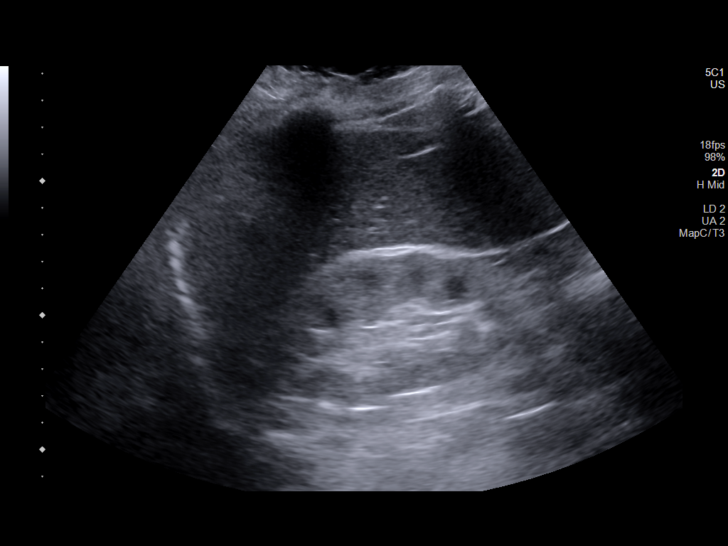
[im 4/40]
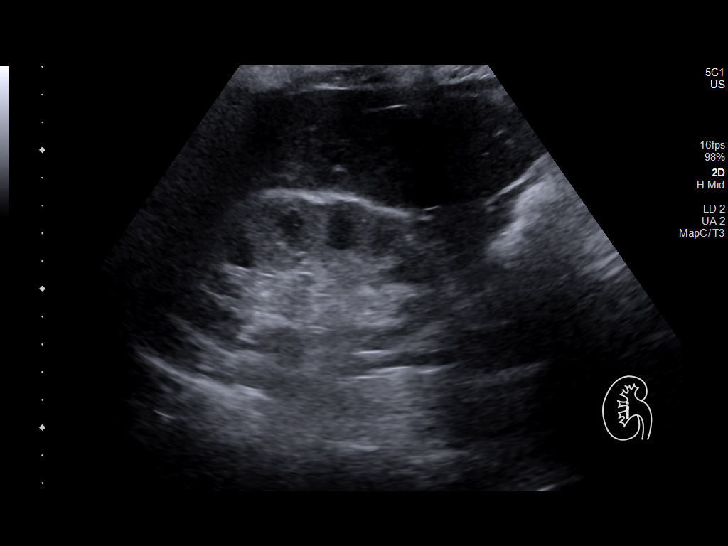
[im 7/40]
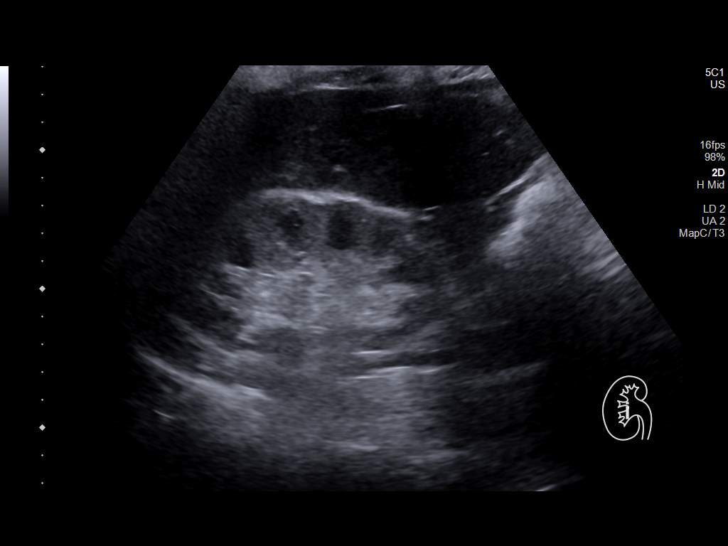
[im 10/40]
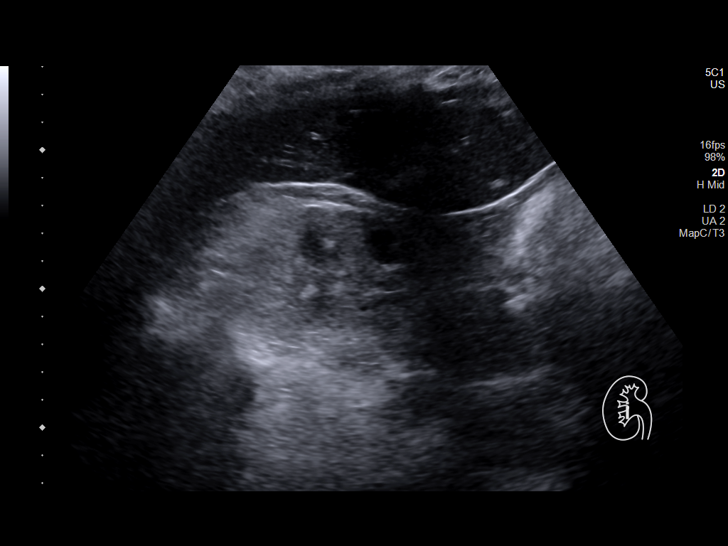
[im 14/40]
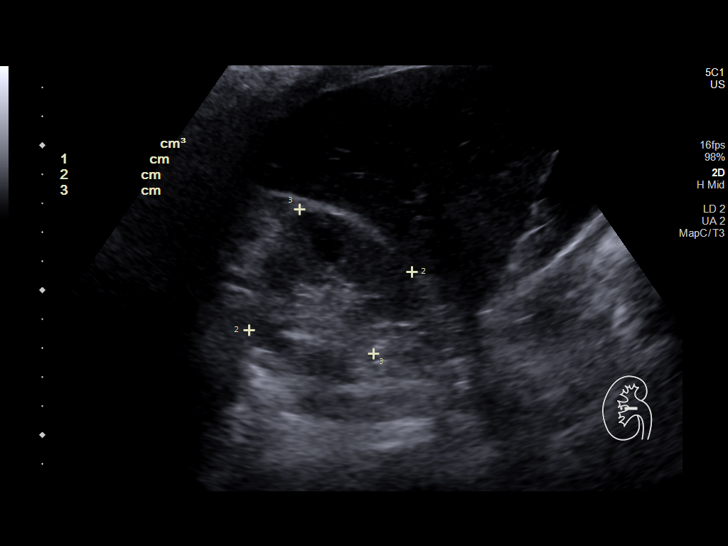
[im 15/40]
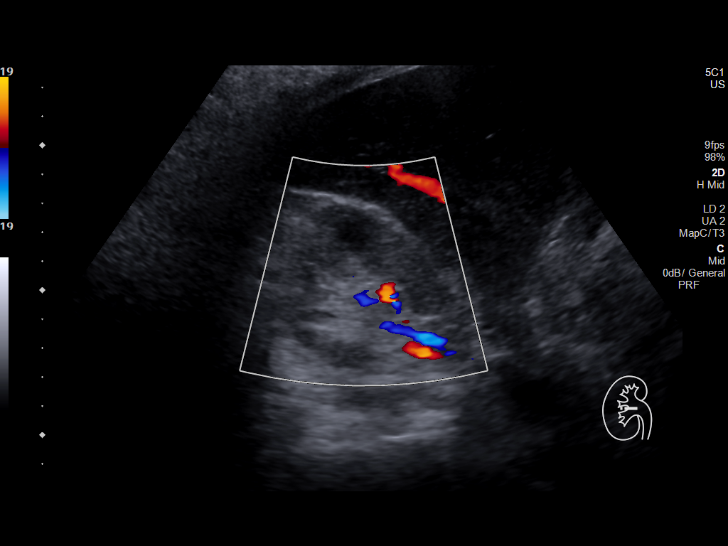
[im 18/40]
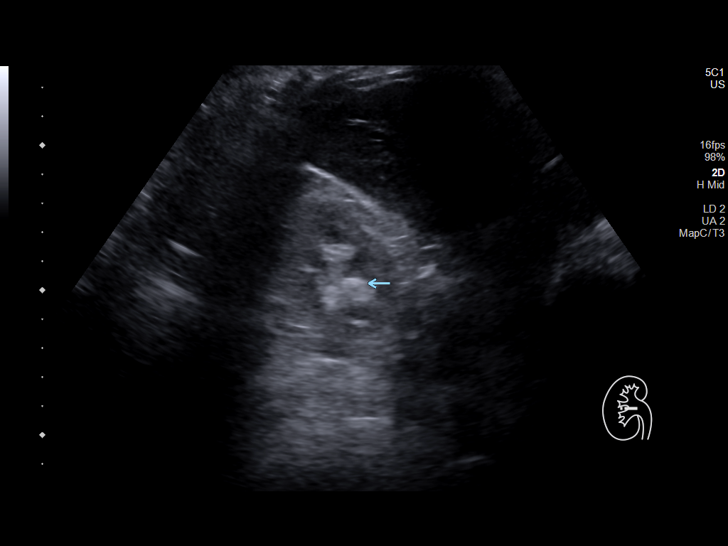
[im 22/40]
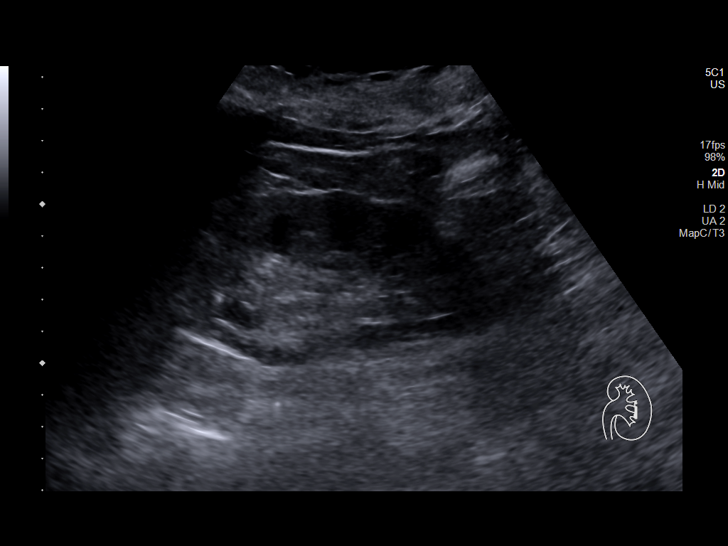
[im 25/40]
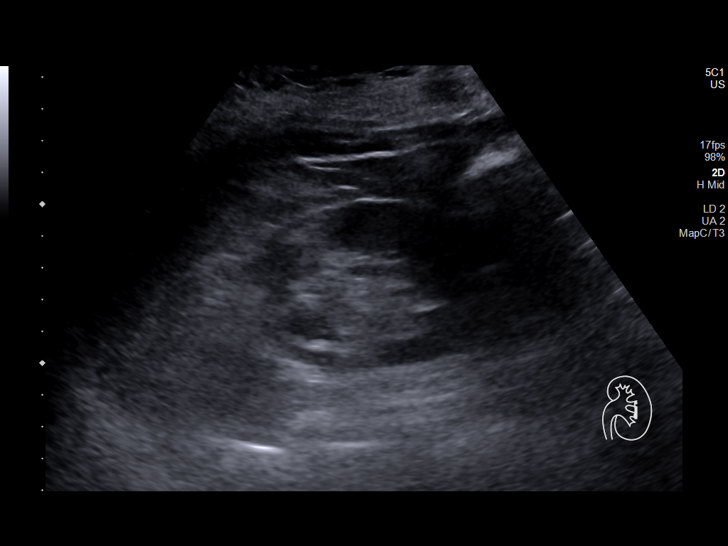
[im 27/40]
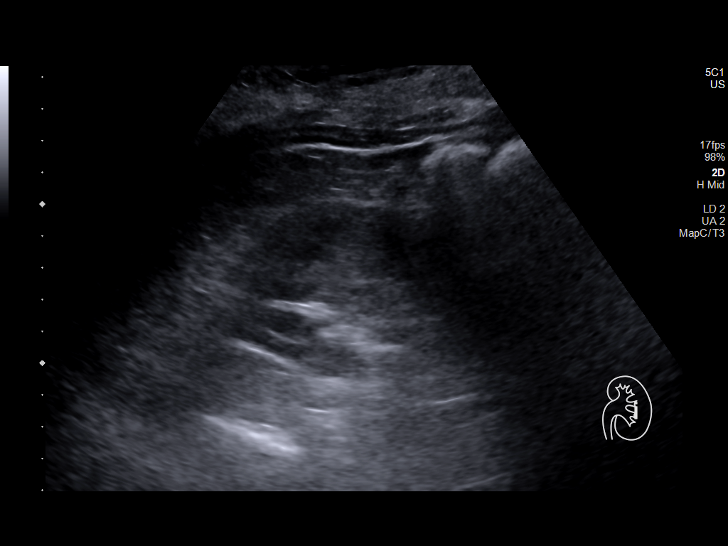
[im 30/40]
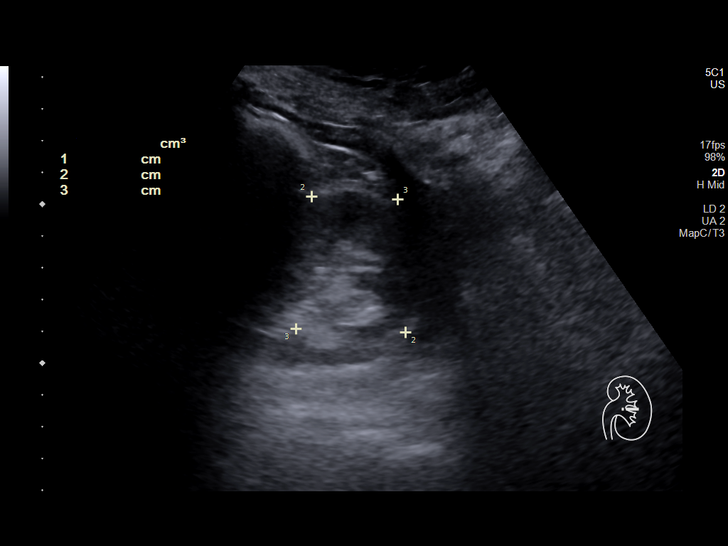
[im 33/40]
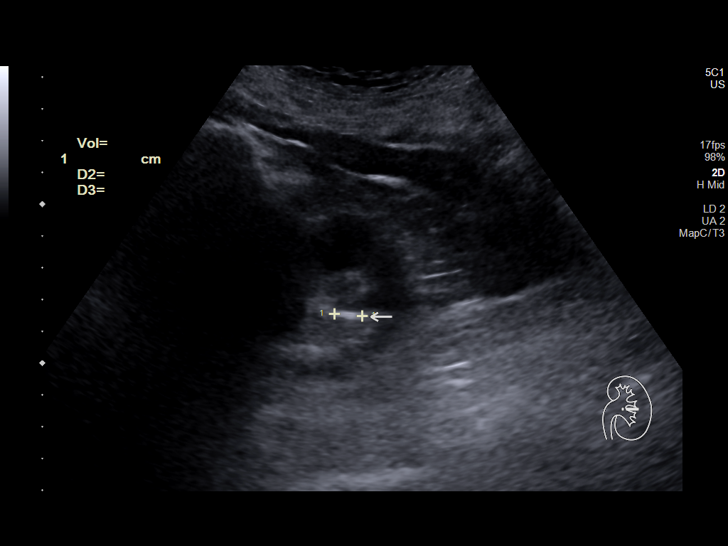
[im 36/40]
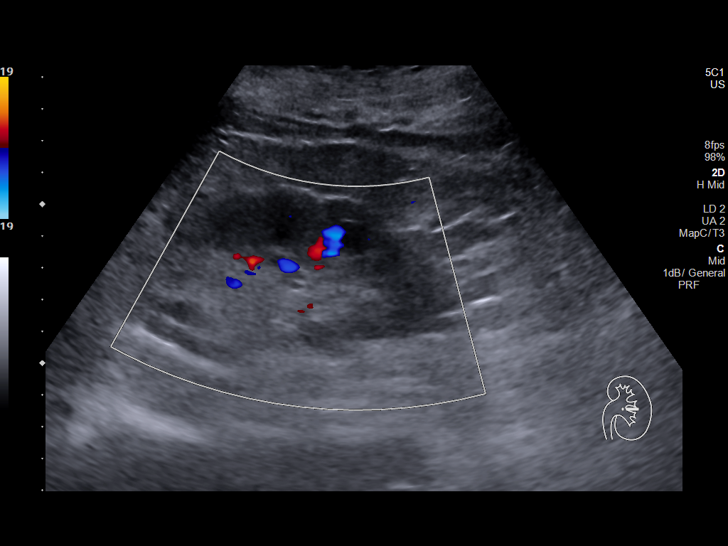
[im 40/40]
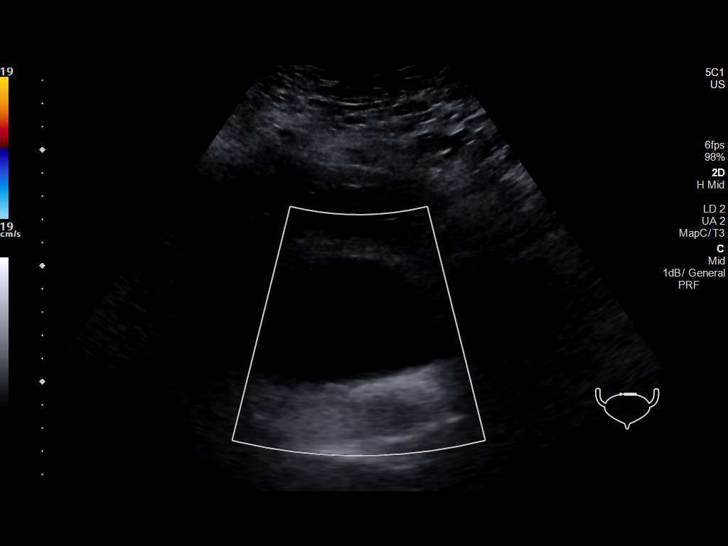

[14 of 25 positions shown; findings below may reference images not displayed]

FINDINGS: Right Kidney:

Renal measurements: 10.9 x 5.9 x 5.6 cm = volume: 183 mL. Normal
cortical thickness. Increased cortical echogenicity. No mass,
hydronephrosis or shadowing calcification. A 7 mm nonshadowing
echogenic focus is identified at the inferior pole the RIGHT kidney,
favor artifact over nonshadowing calculus.

Left Kidney:

Renal measurements: 9.9 x 5.1 x 5.1 cm = volume: 139 mL. Mild
cortical thinning. Increased cortical echogenicity. No mass or
hydronephrosis. Nonshadowing echogenic foci are seen within the LEFT
kidney favor artifacts. No definite shadowing calculi.

Bladder:

Partially distended, grossly unremarkable without a mass or wall
thickening. Calculated volume 171 mL.

Other:

N/A
IMPRESSION: Medical renal disease changes of both kidneys.

No evidence of renal mass or hydronephrosis.

## 2021-01-16 ENCOUNTER — Encounter (INDEPENDENT_AMBULATORY_CARE_PROVIDER_SITE_OTHER): Payer: Medicare Other | Admitting: Ophthalmology

## 2022-03-03 IMAGING — DX DG SHOULDER 2+V*R*
2 series · 2 of 2 positions shown · non-contrast
Comparison: None

CLINICAL DATA: Status post fall.

EXAM:
RIGHT SHOULDER - 2+ VIEW

[shoulder grashey]
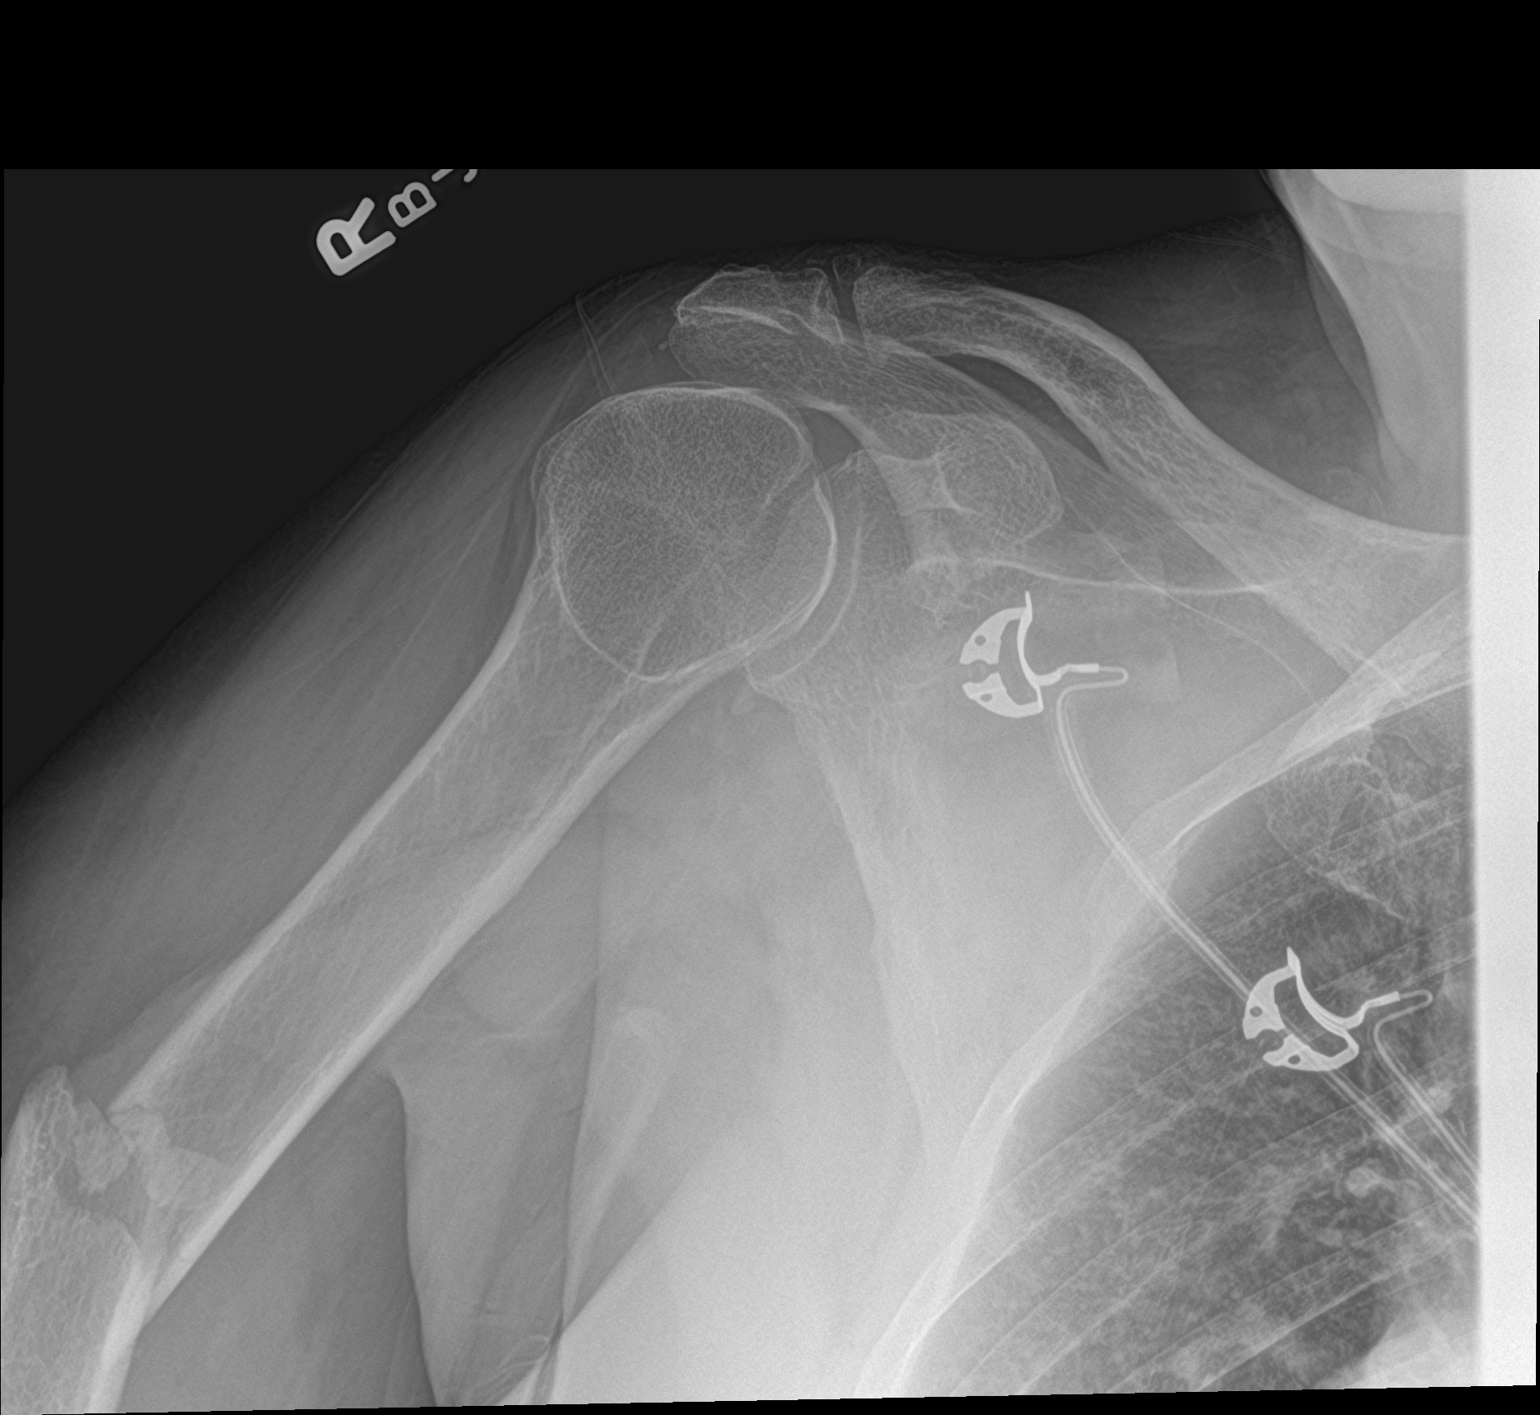

[shoulder y view]
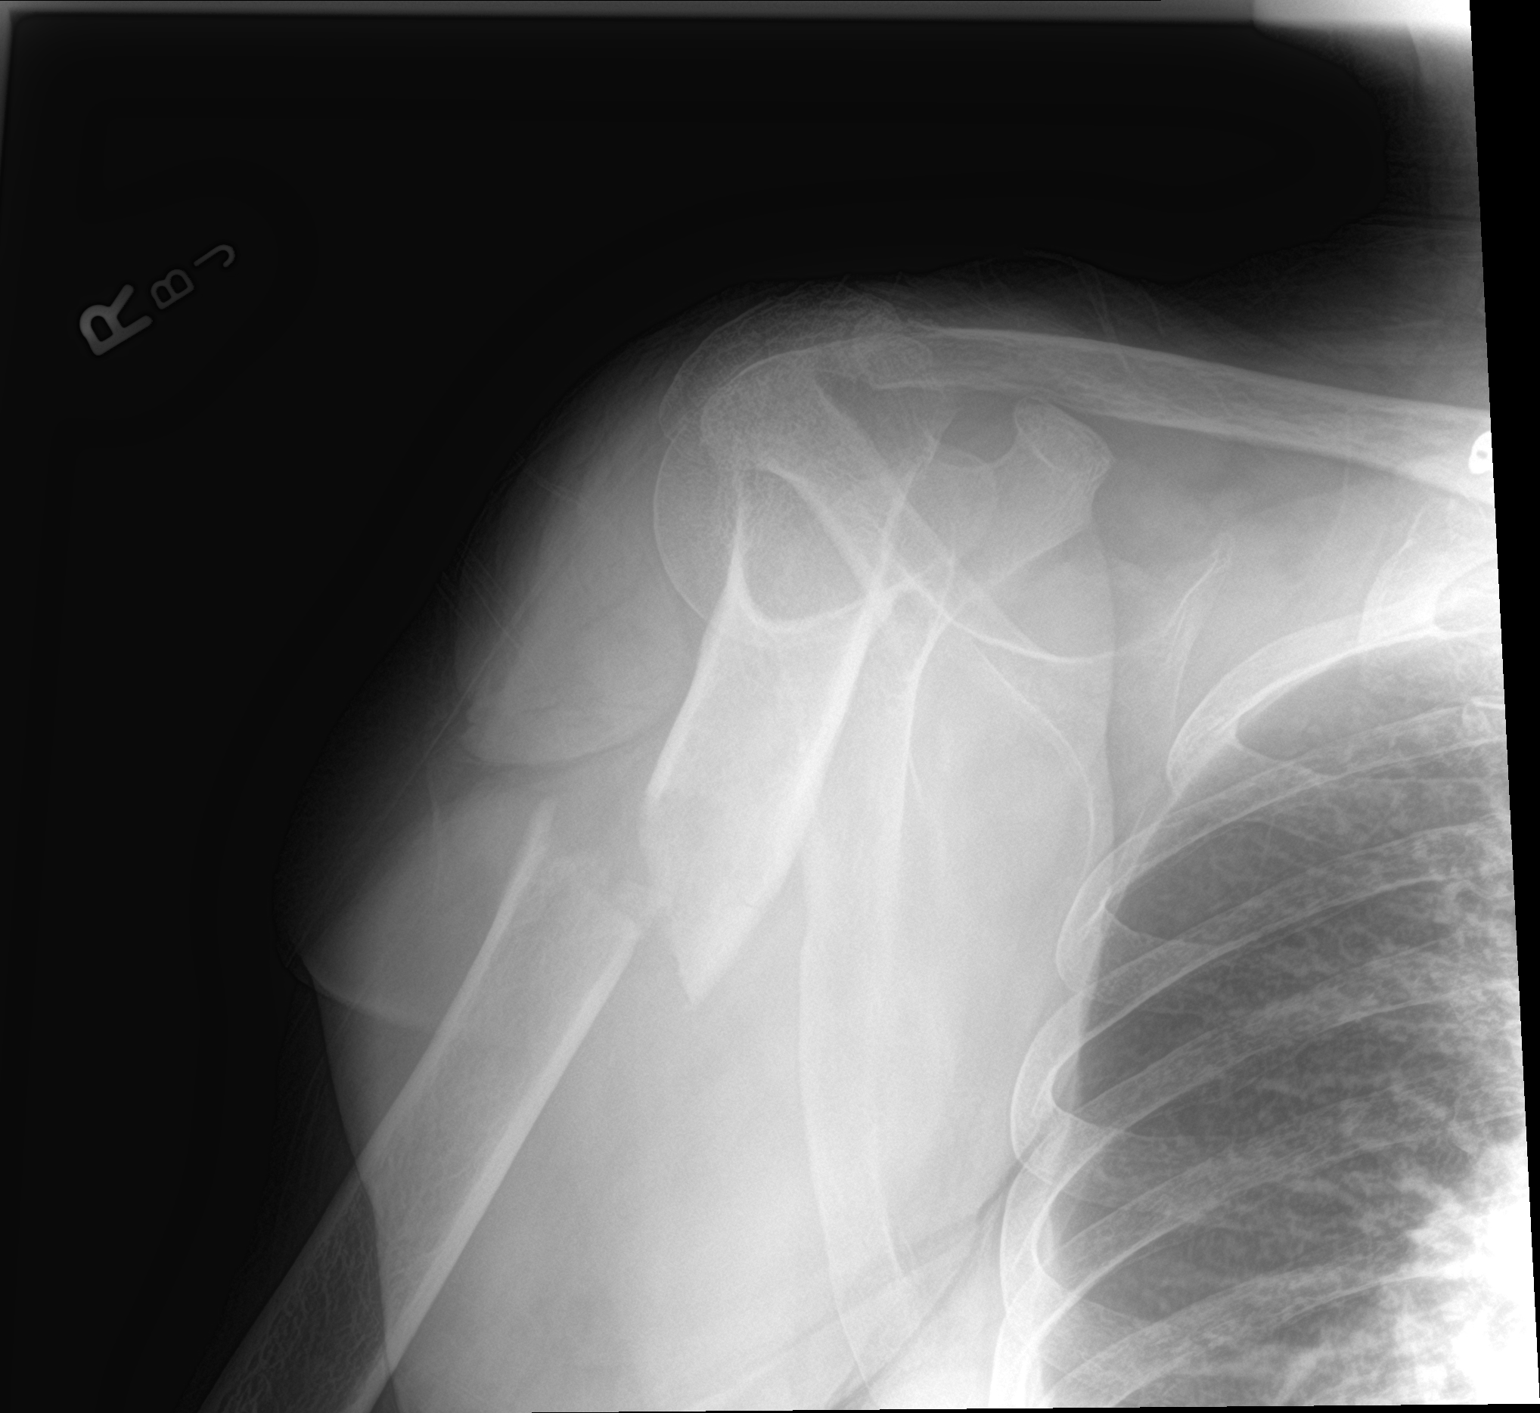

[2 of 2 positions shown; findings below may reference images not displayed]

FINDINGS: There is a comminuted fracture deformity involving the mid shaft of
the right humerus. Lateral displacement of the distal fracture
fragments identified by [DATE] shaft's width. No dislocation
identified. Degenerative changes noted at the AC joint.
IMPRESSION: Comminuted fracture deformity involves the mid shaft of the right
humerus.

## 2022-03-09 IMAGING — DX DG ABDOMEN 2V
3 series · 3 of 3 positions shown · non-contrast
Comparison: None.

CLINICAL DATA: Ileus

EXAM:
ABDOMEN - 2 VIEW

[abdomen supine (1 of 2)]
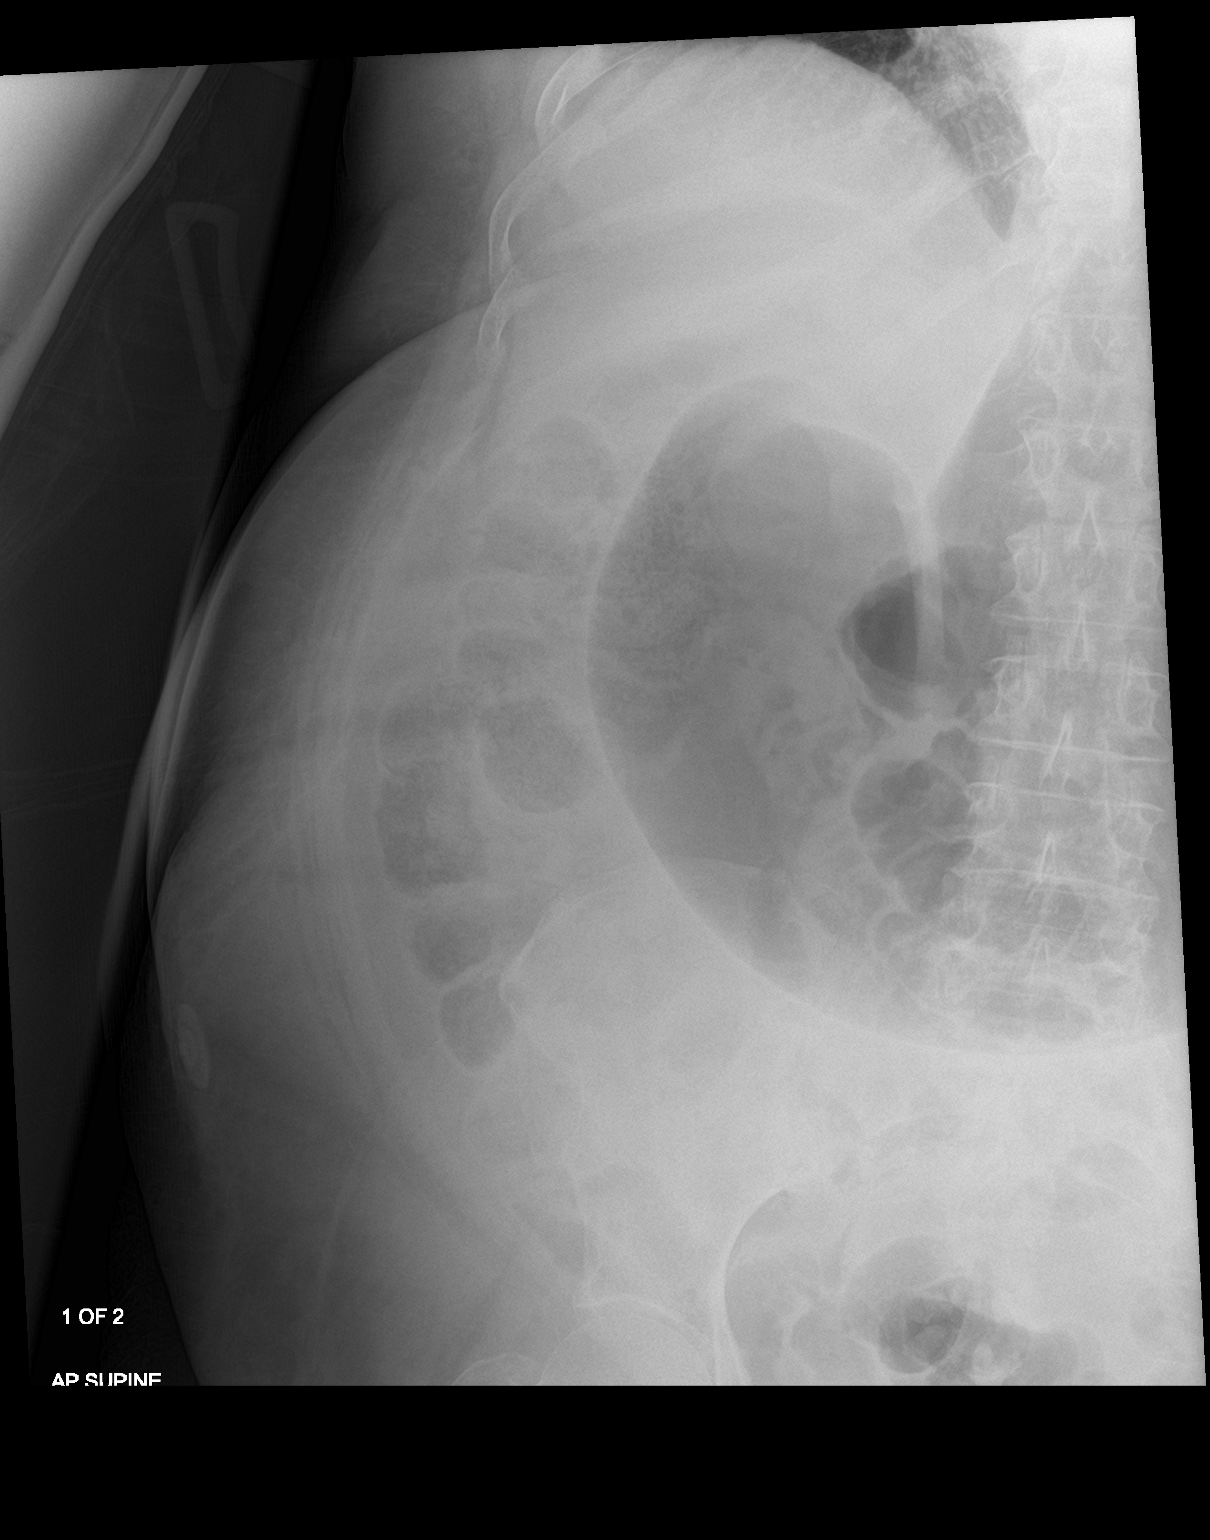

[abdomen supine (2 of 2)]
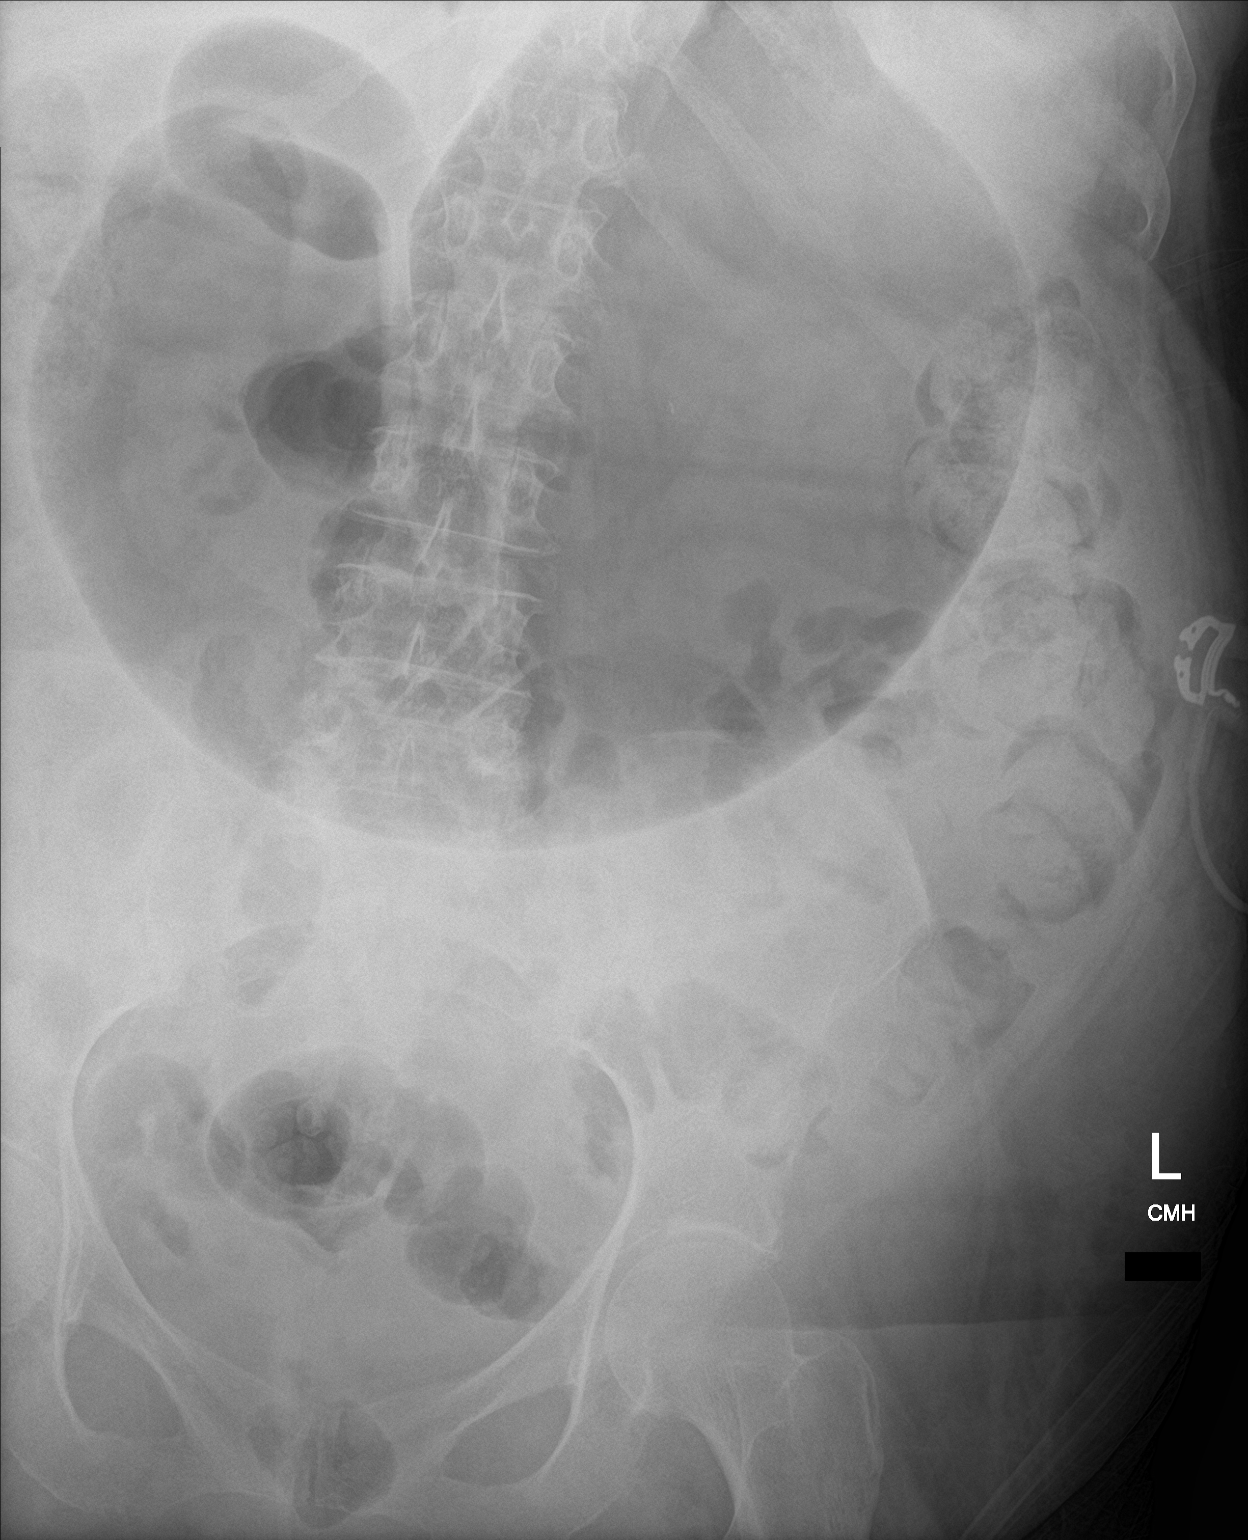

[abdomen erect]
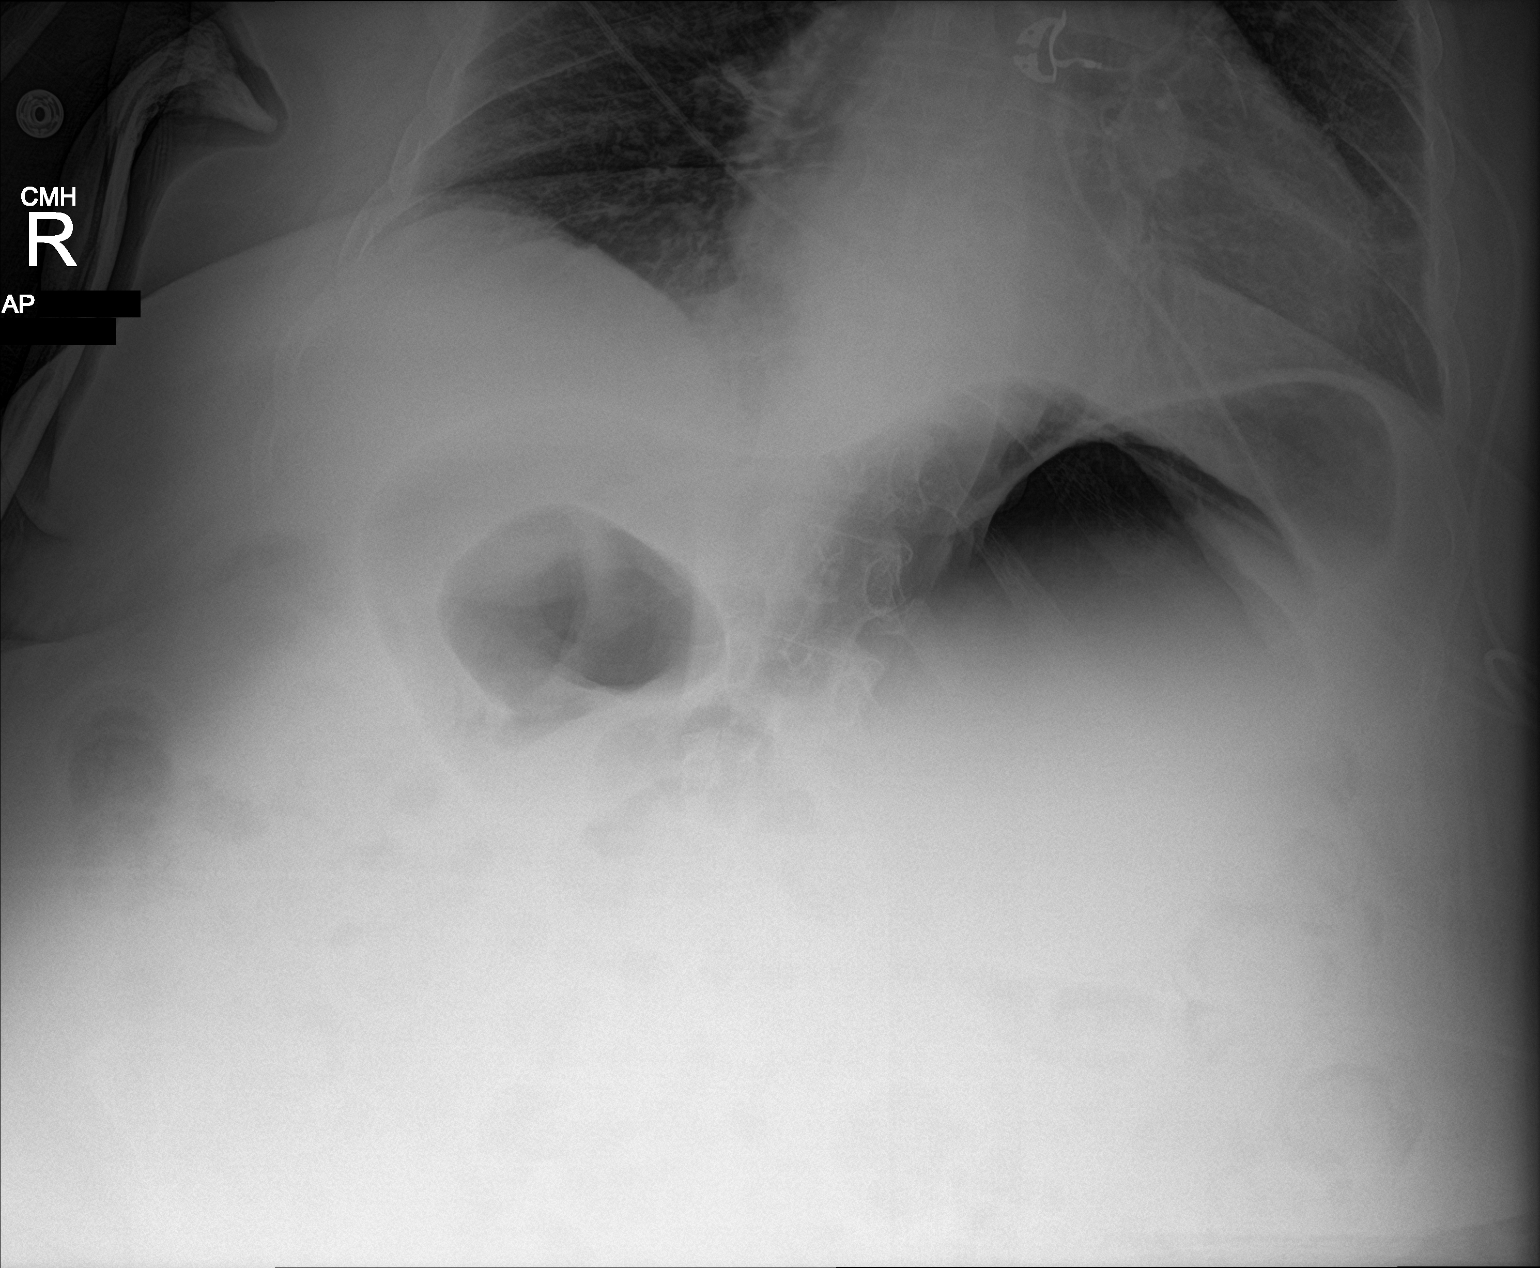

[3 of 3 positions shown; findings below may reference images not displayed]

FINDINGS: Significant gaseous distention of the stomach. Gas throughout
nondistended large and small bowel. Large stool burden throughout
the colon. No free air organomegaly.
IMPRESSION: Significant gaseous distention of the stomach.

Large stool burden throughout the colon.
